# Patient Record
Sex: Male | Born: 1970 | Race: White | Hispanic: No | Marital: Married | Smoking: Former smoker
Health system: Southern US, Community
[De-identification: ages and names within clinical notes are randomized; demographics above are authoritative.]

## PROBLEM LIST (undated history)

## (undated) DIAGNOSIS — I10 Essential (primary) hypertension: Secondary | ICD-10-CM

## (undated) DIAGNOSIS — N200 Calculus of kidney: Secondary | ICD-10-CM

## (undated) HISTORY — PX: GASTRECTOMY: SHX58

---

## 2020-11-24 ENCOUNTER — Ambulatory Visit
Admission: EM | Admit: 2020-11-24 | Discharge: 2020-11-24 | Disposition: A | Discharge status: Home / Self Care | Payer: Managed Care, Other (non HMO)

## 2020-11-24 DIAGNOSIS — R0781 Pleurodynia: Secondary | ICD-10-CM | POA: Diagnosis not present

## 2020-11-24 HISTORY — DX: Essential (primary) hypertension: I10

## 2020-11-24 MED ORDER — PREDNISONE 10 MG (21) PO TBPK: ORAL_TABLET | Freq: Every day | ORAL | 0 refills | Status: DC

## 2020-11-24 MED ORDER — DEXAMETHASONE SODIUM PHOSPHATE 10 MG/ML IJ SOLN
10.0000 mg | Freq: Once | INTRAMUSCULAR | Status: AC
  Administered 2020-11-24: 10 mg via INTRAMUSCULAR

## 2020-11-24 MED ORDER — CYCLOBENZAPRINE HCL 10 MG PO TABS: 10.0000 mg | ORAL_TABLET | Freq: Two times a day (BID) | ORAL | 0 refills | Status: DC | PRN

## 2020-11-24 NOTE — Discharge Instructions (Addendum)
Continue conservative management of rest, ice, and gentle stretches Steroid shot given Prednisone prescribed Take cyclobenzaprine at nighttime for symptomatic relief. Avoid driving or operating heavy machinery while using medication. Follow up with PCP if symptoms persist Return or go to the ER if you have any new or worsening symptoms (fever, chills, chest pain, shortness of breath, chest pain, etc...)

## 2020-11-24 NOTE — ED Triage Notes (Signed)
Pt present left side rib pain, pt states he was at his grandson party at the [employer]. He jump in the balls and was unable to get out and his son in law pulled him out by one arm and now he is having extreme left side pain.

## 2020-11-24 NOTE — ED Provider Notes (Signed)
[facility]   [id number] 11/24/20 Arrival Time: 1324  CC: Rib PAIN  SUBJECTIVE: History from: patient. Ernest Rodgers is a 50 y.o. male complains of LT rib pain and injury that occurred 1 day ago.  Symptoms began after he jumped into ball pit.  Son-in-law picked him up by LT arm.  Localizes the pain to the rib cage.  Describes the pain as intermittent and sharp in character.  Has tried OTC medications without relief.  Symptoms are made worse with deep breath and laughing.  Report similar symptoms in the past.  Denies fever, chills, erythema, ecchymosis, effusion, weakness, numbness and tingling, CP, SOB.    ROS: As per HPI.  All other pertinent ROS negative.     Past Medical History:  Diagnosis Date   Hypertension    Past Surgical History:  Procedure Laterality Date   GASTRECTOMY     No Known Allergies No current facility-administered medications on file prior to encounter.   Current Outpatient Medications on File Prior to Encounter  Medication Sig Dispense Refill   Fluoxetine HCl, PMDD, 20 MG TABS Take by mouth.     lisinopril (ZESTRIL) 10 MG tablet lisinopril 10 mg tablet     furosemide (LASIX) 20 MG tablet Take 20 mg by mouth every morning.     metoprolol succinate (TOPROL-XL) 50 MG 24 hr tablet Take 50 mg by mouth daily.     omeprazole (PRILOSEC) 20 MG capsule Take 20 mg by mouth daily.     Social History   Socioeconomic History   Marital status: Married    Spouse name: Not on file   Number of children: Not on file   Years of education: Not on file   Highest education level: Not on file  Occupational History   Not on file  Tobacco Use   Smoking status: Never   Smokeless tobacco: Never  Substance and Sexual Activity   Alcohol use: Not on file   Drug use: Not on file   Sexual activity: Not on file  Other Topics Concern   Not on file  Social History Narrative   Not on file   Social Determinants of Health   Financial Resource Strain: Not on file  Food  Insecurity: Not on file  Transportation Needs: Not on file  Physical Activity: Not on file  Stress: Not on file  Social Connections: Not on file  Intimate Partner Violence: Not on file   History reviewed. No pertinent family history.  OBJECTIVE:  Vitals:   11/24/20 1443  BP: 117/80  Pulse: 77  Resp: 18  Temp: 97.8 F (36.6 C)  TempSrc: Oral  SpO2: 95%    General appearance: ALERT; in no acute distress.  Head: NCAT Lungs: Normal respiratory effort; CTAB CV: RRR Musculoskeletal: Rib cage Inspection: Skin warm, dry, clear and intact without obvious erythema, effusion, or ecchymosis.  Palpation: TTP over LT LT rib cage; TTP with AP compression of chest ROM: FROM active and passive Skin: warm and dry Neurologic: Ambulates without difficulty; Sensation intact about the upper/ lower extremities Psychological: alert and cooperative; normal mood and affect  ASSESSMENT & PLAN:  1. Rib pain on left side     Meds ordered this encounter  Medications   predniSONE (STERAPRED UNI-PAK 21 TAB) 10 MG (21) TBPK tablet    Sig: Take by mouth daily. Take 6 tabs by mouth daily  for 2 days, then 5 tabs for 2 days, then 4 tabs for 2 days, then 3 tabs for 2 days,  2 tabs for 2 days, then 1 tab by mouth daily for 2 days    Dispense:  42 tablet    Refill:  0    Order Specific Question:   Supervising Provider    Answer:   Eustace Moore [[id number]]   cyclobenzaprine (FLEXERIL) 10 MG tablet    Sig: Take 1 tablet (10 mg total) by mouth 2 (two) times daily as needed for muscle spasms.    Dispense:  20 tablet    Refill:  0    Order Specific Question:   Supervising Provider    Answer:   Eustace Moore [[id number]]   dexamethasone (DECADRON) injection 10 mg    Continue conservative management of rest, ice, and gentle stretches Steroid shot given Prednisone prescribed Take cyclobenzaprine at nighttime for symptomatic relief. Avoid driving or operating heavy machinery while using  medication. Follow up with PCP if symptoms persist Return or go to the ER if you have any new or worsening symptoms (fever, chills, chest pain, shortness of breath, chest pain, etc...)   Reviewed expectations re: course of current medical issues. Questions answered. Outlined signs and symptoms indicating need for more acute intervention. Patient verbalized understanding. After Visit Summary given.     Rennis Harding, PA-C 11/24/20 1520

## 2020-11-26 ENCOUNTER — Inpatient Hospital Stay (HOSPITAL_COMMUNITY)
Admission: EM | Admit: 2020-11-26 | Discharge: 2020-12-13 | DRG: 981 | Disposition: A | Discharge status: Rehabilitation Facility | Payer: Managed Care, Other (non HMO) | Attending: Family Medicine | Admitting: Family Medicine

## 2020-11-26 ENCOUNTER — Emergency Department (HOSPITAL_COMMUNITY): Payer: Managed Care, Other (non HMO)

## 2020-11-26 ENCOUNTER — Other Ambulatory Visit: Payer: Self-pay

## 2020-11-26 ENCOUNTER — Encounter (HOSPITAL_COMMUNITY): Payer: Self-pay | Admitting: *Deleted

## 2020-11-26 DIAGNOSIS — K661 Hemoperitoneum: Secondary | ICD-10-CM | POA: Diagnosis present

## 2020-11-26 DIAGNOSIS — I110 Hypertensive heart disease with heart failure: Secondary | ICD-10-CM | POA: Diagnosis present

## 2020-11-26 DIAGNOSIS — K760 Fatty (change of) liver, not elsewhere classified: Secondary | ICD-10-CM | POA: Diagnosis present

## 2020-11-26 DIAGNOSIS — R0682 Tachypnea, not elsewhere classified: Secondary | ICD-10-CM

## 2020-11-26 DIAGNOSIS — G9341 Metabolic encephalopathy: Secondary | ICD-10-CM | POA: Diagnosis not present

## 2020-11-26 DIAGNOSIS — K852 Alcohol induced acute pancreatitis without necrosis or infection: Secondary | ICD-10-CM | POA: Diagnosis not present

## 2020-11-26 DIAGNOSIS — R935 Abnormal findings on diagnostic imaging of other abdominal regions, including retroperitoneum: Secondary | ICD-10-CM | POA: Diagnosis not present

## 2020-11-26 DIAGNOSIS — N179 Acute kidney failure, unspecified: Secondary | ICD-10-CM | POA: Diagnosis not present

## 2020-11-26 DIAGNOSIS — R400 Somnolence: Secondary | ICD-10-CM

## 2020-11-26 DIAGNOSIS — S36029A Unspecified contusion of spleen, initial encounter: Secondary | ICD-10-CM | POA: Diagnosis present

## 2020-11-26 DIAGNOSIS — I10 Essential (primary) hypertension: Secondary | ICD-10-CM | POA: Diagnosis not present

## 2020-11-26 DIAGNOSIS — R5381 Other malaise: Secondary | ICD-10-CM | POA: Diagnosis not present

## 2020-11-26 DIAGNOSIS — T1490XA Injury, unspecified, initial encounter: Secondary | ICD-10-CM

## 2020-11-26 DIAGNOSIS — Z978 Presence of other specified devices: Secondary | ICD-10-CM

## 2020-11-26 DIAGNOSIS — J9690 Respiratory failure, unspecified, unspecified whether with hypoxia or hypercapnia: Secondary | ICD-10-CM

## 2020-11-26 DIAGNOSIS — R069 Unspecified abnormalities of breathing: Secondary | ICD-10-CM

## 2020-11-26 DIAGNOSIS — E870 Hyperosmolality and hypernatremia: Secondary | ICD-10-CM | POA: Diagnosis not present

## 2020-11-26 DIAGNOSIS — F10939 Alcohol use, unspecified with withdrawal, unspecified: Secondary | ICD-10-CM | POA: Diagnosis present

## 2020-11-26 DIAGNOSIS — F10239 Alcohol dependence with withdrawal, unspecified: Secondary | ICD-10-CM | POA: Diagnosis present

## 2020-11-26 DIAGNOSIS — K851 Biliary acute pancreatitis without necrosis or infection: Secondary | ICD-10-CM | POA: Diagnosis not present

## 2020-11-26 DIAGNOSIS — S2242XA Multiple fractures of ribs, left side, initial encounter for closed fracture: Secondary | ICD-10-CM | POA: Diagnosis present

## 2020-11-26 DIAGNOSIS — Z79899 Other long term (current) drug therapy: Secondary | ICD-10-CM

## 2020-11-26 DIAGNOSIS — K8521 Alcohol induced acute pancreatitis with uninfected necrosis: Principal | ICD-10-CM | POA: Diagnosis present

## 2020-11-26 DIAGNOSIS — S2242XS Multiple fractures of ribs, left side, sequela: Secondary | ICD-10-CM | POA: Diagnosis not present

## 2020-11-26 DIAGNOSIS — D735 Infarction of spleen: Secondary | ICD-10-CM | POA: Diagnosis present

## 2020-11-26 DIAGNOSIS — R1084 Generalized abdominal pain: Secondary | ICD-10-CM | POA: Diagnosis not present

## 2020-11-26 DIAGNOSIS — R319 Hematuria, unspecified: Secondary | ICD-10-CM | POA: Diagnosis not present

## 2020-11-26 DIAGNOSIS — Z20822 Contact with and (suspected) exposure to covid-19: Secondary | ICD-10-CM | POA: Diagnosis present

## 2020-11-26 DIAGNOSIS — D62 Acute posthemorrhagic anemia: Secondary | ICD-10-CM | POA: Diagnosis not present

## 2020-11-26 DIAGNOSIS — E669 Obesity, unspecified: Secondary | ICD-10-CM | POA: Diagnosis present

## 2020-11-26 DIAGNOSIS — K7010 Alcoholic hepatitis without ascites: Secondary | ICD-10-CM | POA: Diagnosis present

## 2020-11-26 DIAGNOSIS — E876 Hypokalemia: Secondary | ICD-10-CM | POA: Diagnosis present

## 2020-11-26 DIAGNOSIS — K5669 Other partial intestinal obstruction: Secondary | ICD-10-CM | POA: Diagnosis present

## 2020-11-26 DIAGNOSIS — Z8249 Family history of ischemic heart disease and other diseases of the circulatory system: Secondary | ICD-10-CM

## 2020-11-26 DIAGNOSIS — J9601 Acute respiratory failure with hypoxia: Secondary | ICD-10-CM | POA: Diagnosis not present

## 2020-11-26 DIAGNOSIS — J189 Pneumonia, unspecified organism: Secondary | ICD-10-CM

## 2020-11-26 DIAGNOSIS — G931 Anoxic brain damage, not elsewhere classified: Secondary | ICD-10-CM | POA: Diagnosis not present

## 2020-11-26 DIAGNOSIS — Z781 Physical restraint status: Secondary | ICD-10-CM

## 2020-11-26 DIAGNOSIS — F10231 Alcohol dependence with withdrawal delirium: Secondary | ICD-10-CM | POA: Diagnosis not present

## 2020-11-26 DIAGNOSIS — F1010 Alcohol abuse, uncomplicated: Secondary | ICD-10-CM | POA: Diagnosis not present

## 2020-11-26 DIAGNOSIS — K567 Ileus, unspecified: Secondary | ICD-10-CM

## 2020-11-26 DIAGNOSIS — J810 Acute pulmonary edema: Secondary | ICD-10-CM | POA: Diagnosis not present

## 2020-11-26 DIAGNOSIS — F10230 Alcohol dependence with withdrawal, uncomplicated: Secondary | ICD-10-CM | POA: Diagnosis not present

## 2020-11-26 DIAGNOSIS — R040 Epistaxis: Secondary | ICD-10-CM | POA: Diagnosis not present

## 2020-11-26 DIAGNOSIS — W19XXXA Unspecified fall, initial encounter: Secondary | ICD-10-CM | POA: Diagnosis present

## 2020-11-26 DIAGNOSIS — I5031 Acute diastolic (congestive) heart failure: Secondary | ICD-10-CM | POA: Diagnosis not present

## 2020-11-26 DIAGNOSIS — J690 Pneumonitis due to inhalation of food and vomit: Secondary | ICD-10-CM | POA: Diagnosis not present

## 2020-11-26 DIAGNOSIS — K859 Acute pancreatitis without necrosis or infection, unspecified: Secondary | ICD-10-CM | POA: Diagnosis present

## 2020-11-26 DIAGNOSIS — M533 Sacrococcygeal disorders, not elsewhere classified: Secondary | ICD-10-CM | POA: Diagnosis present

## 2020-11-26 DIAGNOSIS — Z87891 Personal history of nicotine dependence: Secondary | ICD-10-CM

## 2020-11-26 DIAGNOSIS — R0603 Acute respiratory distress: Secondary | ICD-10-CM | POA: Diagnosis not present

## 2020-11-26 DIAGNOSIS — Z4659 Encounter for fitting and adjustment of other gastrointestinal appliance and device: Secondary | ICD-10-CM

## 2020-11-26 DIAGNOSIS — J9602 Acute respiratory failure with hypercapnia: Secondary | ICD-10-CM | POA: Diagnosis not present

## 2020-11-26 DIAGNOSIS — Z9884 Bariatric surgery status: Secondary | ICD-10-CM

## 2020-11-26 DIAGNOSIS — R1011 Right upper quadrant pain: Secondary | ICD-10-CM | POA: Diagnosis not present

## 2020-11-26 DIAGNOSIS — F329 Major depressive disorder, single episode, unspecified: Secondary | ICD-10-CM | POA: Diagnosis present

## 2020-11-26 DIAGNOSIS — Z23 Encounter for immunization: Secondary | ICD-10-CM

## 2020-11-26 DIAGNOSIS — K56609 Unspecified intestinal obstruction, unspecified as to partial versus complete obstruction: Secondary | ICD-10-CM

## 2020-11-26 DIAGNOSIS — D539 Nutritional anemia, unspecified: Secondary | ICD-10-CM | POA: Diagnosis present

## 2020-11-26 DIAGNOSIS — J9600 Acute respiratory failure, unspecified whether with hypoxia or hypercapnia: Secondary | ICD-10-CM | POA: Diagnosis not present

## 2020-11-26 DIAGNOSIS — Z0189 Encounter for other specified special examinations: Secondary | ICD-10-CM

## 2020-11-26 DIAGNOSIS — K219 Gastro-esophageal reflux disease without esophagitis: Secondary | ICD-10-CM | POA: Diagnosis present

## 2020-11-26 DIAGNOSIS — Z87442 Personal history of urinary calculi: Secondary | ICD-10-CM

## 2020-11-26 DIAGNOSIS — R0600 Dyspnea, unspecified: Secondary | ICD-10-CM

## 2020-11-26 DIAGNOSIS — R739 Hyperglycemia, unspecified: Secondary | ICD-10-CM | POA: Diagnosis present

## 2020-11-26 DIAGNOSIS — R109 Unspecified abdominal pain: Secondary | ICD-10-CM | POA: Diagnosis not present

## 2020-11-26 DIAGNOSIS — F419 Anxiety disorder, unspecified: Secondary | ICD-10-CM | POA: Diagnosis present

## 2020-11-26 DIAGNOSIS — Z6839 Body mass index (BMI) 39.0-39.9, adult: Secondary | ICD-10-CM

## 2020-11-26 DIAGNOSIS — I959 Hypotension, unspecified: Secondary | ICD-10-CM | POA: Diagnosis not present

## 2020-11-26 HISTORY — DX: Calculus of kidney: N20.0

## 2020-11-26 LAB — URINALYSIS, ROUTINE W REFLEX MICROSCOPIC
Bilirubin Urine: NEGATIVE
Glucose, UA: NEGATIVE mg/dL
Hgb urine dipstick: NEGATIVE
Ketones, ur: 5 mg/dL — AB
Leukocytes,Ua: NEGATIVE
Nitrite: NEGATIVE
Protein, ur: NEGATIVE mg/dL
Specific Gravity, Urine: 1.015 (ref 1.005–1.030)
pH: 5 (ref 5.0–8.0)

## 2020-11-26 LAB — COMPREHENSIVE METABOLIC PANEL
ALT: 87 U/L — ABNORMAL HIGH (ref 0–44)
AST: 105 U/L — ABNORMAL HIGH (ref 15–41)
Albumin: 4.3 g/dL (ref 3.5–5.0)
Alkaline Phosphatase: 67 U/L (ref 38–126)
Anion gap: 19 — ABNORMAL HIGH (ref 5–15)
BUN: 11 mg/dL (ref 6–20)
CO2: 20 mmol/L — ABNORMAL LOW (ref 22–32)
Calcium: 9.8 mg/dL (ref 8.9–10.3)
Chloride: 100 mmol/L (ref 98–111)
Creatinine, Ser: 0.79 mg/dL (ref 0.61–1.24)
GFR, Estimated: >60 mL/min (ref >60)
Glucose, Bld: 103 mg/dL — ABNORMAL HIGH (ref 70–99)
Potassium: 3.1 mmol/L — ABNORMAL LOW (ref 3.5–5.1)
Sodium: 139 mmol/L (ref 135–145)
Total Bilirubin: 2 mg/dL — ABNORMAL HIGH (ref 0.3–1.2)
Total Protein: 7.6 g/dL (ref 6.5–8.1)

## 2020-11-26 LAB — CBC WITH DIFFERENTIAL/PLATELET
Abs Immature Granulocytes: 0.03 10*3/uL (ref 0.00–0.07)
Basophils Absolute: 0 10*3/uL (ref 0.0–0.1)
Basophils Relative: 0 %
Eosinophils Absolute: 0 10*3/uL (ref 0.0–0.5)
Eosinophils Relative: 0 %
HCT: 40.8 % (ref 39.0–52.0)
Hemoglobin: 14.6 g/dL (ref 13.0–17.0)
Immature Granulocytes: 0 %
Lymphocytes Relative: 9 %
Lymphs Abs: 0.7 10*3/uL (ref 0.7–4.0)
MCH: 37.4 pg — ABNORMAL HIGH (ref 26.0–34.0)
MCHC: 35.8 g/dL (ref 30.0–36.0)
MCV: 104.6 fL — ABNORMAL HIGH (ref 80.0–100.0)
Monocytes Absolute: 0.8 10*3/uL (ref 0.1–1.0)
Monocytes Relative: 11 %
Neutro Abs: 6 10*3/uL (ref 1.7–7.7)
Neutrophils Relative %: 80 %
Platelets: 160 10*3/uL (ref 150–400)
RBC: 3.9 MIL/uL — ABNORMAL LOW (ref 4.22–5.81)
RDW: 14.7 % (ref 11.5–15.5)
WBC: 7.5 10*3/uL (ref 4.0–10.5)
nRBC: 0.3 % — ABNORMAL HIGH (ref 0.0–0.2)

## 2020-11-26 LAB — RAPID URINE DRUG SCREEN, HOSP PERFORMED
Amphetamines: NOT DETECTED
Barbiturates: NOT DETECTED
Benzodiazepines: NOT DETECTED
Cocaine: NOT DETECTED
Opiates: POSITIVE — AB
Tetrahydrocannabinol: NOT DETECTED

## 2020-11-26 LAB — HIV ANTIBODY (ROUTINE TESTING W REFLEX): HIV Screen 4th Generation wRfx: NONREACTIVE

## 2020-11-26 LAB — ETHANOL: Alcohol, Ethyl (B): <10 mg/dL (ref <10)

## 2020-11-26 LAB — TROPONIN I (HIGH SENSITIVITY)
Troponin I (High Sensitivity): 5 ng/L (ref <18)
Troponin I (High Sensitivity): 8 ng/L (ref <18)

## 2020-11-26 LAB — LACTIC ACID, PLASMA
Lactic Acid, Venous: 2.3 mmol/L (ref 0.5–1.9)
Lactic Acid, Venous: 1.7 mmol/L (ref 0.5–1.9)

## 2020-11-26 LAB — LIPID PANEL
Cholesterol: 188 mg/dL (ref 0–200)
HDL: 97 mg/dL (ref >40)
LDL Cholesterol: 78 mg/dL (ref 0–99)
Total CHOL/HDL Ratio: 1.9 RATIO
Triglycerides: 67 mg/dL (ref <150)
VLDL: 13 mg/dL (ref 0–40)

## 2020-11-26 LAB — SARS CORONAVIRUS 2 (TAT 6-24 HRS): SARS Coronavirus 2: NEGATIVE

## 2020-11-26 LAB — MAGNESIUM: Magnesium: 1.1 mg/dL — ABNORMAL LOW (ref 1.7–2.4)

## 2020-11-26 LAB — POC SARS CORONAVIRUS 2 AG -  ED: SARSCOV2ONAVIRUS 2 AG: NEGATIVE

## 2020-11-26 MED ORDER — LISINOPRIL 10 MG PO TABS
10.0000 mg | ORAL_TABLET | Freq: Every day | ORAL | Status: DC
  Administered 2020-11-27: 10 mg via ORAL
  Filled 2020-11-26 (×2): qty 1

## 2020-11-26 MED ORDER — SODIUM CHLORIDE 0.9 % IV BOLUS
1000.0000 mL | Freq: Once | INTRAVENOUS | Status: AC
  Administered 2020-11-26: 1000 mL via INTRAVENOUS

## 2020-11-26 MED ORDER — FLUOXETINE HCL 20 MG PO CAPS
20.0000 mg | ORAL_CAPSULE | Freq: Every day | ORAL | Status: DC
  Administered 2020-11-27 – 2020-11-28 (×2): 20 mg via ORAL
  Filled 2020-11-26 (×3): qty 1

## 2020-11-26 MED ORDER — HYDROMORPHONE HCL 1 MG/ML IJ SOLN
2.0000 mg | Freq: Once | INTRAMUSCULAR | Status: AC
  Administered 2020-11-26: 2 mg via INTRAVENOUS
  Filled 2020-11-26: qty 2

## 2020-11-26 MED ORDER — LACTATED RINGERS IV SOLN: INTRAVENOUS | Status: AC

## 2020-11-26 MED ORDER — FENTANYL CITRATE PF 50 MCG/ML IJ SOSY: 25.0000 ug | PREFILLED_SYRINGE | INTRAMUSCULAR | Status: DC | PRN

## 2020-11-26 MED ORDER — LIDOCAINE 5 % EX PTCH
1.0000 | MEDICATED_PATCH | Freq: Every day | CUTANEOUS | Status: DC | PRN
  Administered 2020-12-11: 1 via TRANSDERMAL
  Filled 2020-11-26 (×2): qty 1

## 2020-11-26 MED ORDER — HYDROMORPHONE HCL 1 MG/ML IJ SOLN
2.0000 mg | INTRAMUSCULAR | Status: DC | PRN
  Administered 2020-11-27 (×3): 2 mg via INTRAVENOUS
  Filled 2020-11-26 (×3): qty 2

## 2020-11-26 MED ORDER — POTASSIUM CHLORIDE 10 MEQ/100ML IV SOLN
10.0000 meq | INTRAVENOUS | Status: AC
Start: 2020-11-26 — End: 2020-11-27
  Administered 2020-11-26 (×4): 10 meq via INTRAVENOUS
  Filled 2020-11-26 (×4): qty 100

## 2020-11-26 MED ORDER — FENTANYL CITRATE PF 50 MCG/ML IJ SOSY
50.0000 ug | PREFILLED_SYRINGE | INTRAMUSCULAR | Status: DC | PRN
  Administered 2020-11-26: 50 ug via NASAL

## 2020-11-26 MED ORDER — HYDROMORPHONE HCL 1 MG/ML IJ SOLN
1.0000 mg | Freq: Once | INTRAMUSCULAR | Status: AC
  Administered 2020-11-26: 1 mg via INTRAVENOUS
  Filled 2020-11-26: qty 1

## 2020-11-26 MED ORDER — METOPROLOL SUCCINATE ER 50 MG PO TB24
50.0000 mg | ORAL_TABLET | Freq: Every day | ORAL | Status: DC
  Administered 2020-11-27 – 2020-11-28 (×2): 50 mg via ORAL
  Filled 2020-11-26: qty 2
  Filled 2020-11-26: qty 1

## 2020-11-26 MED ORDER — LORAZEPAM 1 MG PO TABS
1.0000 mg | ORAL_TABLET | Freq: Every day | ORAL | Status: DC
  Administered 2020-11-26: 1 mg via ORAL
  Filled 2020-11-26: qty 1

## 2020-11-26 MED ORDER — ONDANSETRON 4 MG PO TBDP
4.0000 mg | ORAL_TABLET | Freq: Once | ORAL | Status: AC
Start: 2020-11-26 — End: 2020-11-26
  Administered 2020-11-26: 4 mg via ORAL
  Filled 2020-11-26: qty 1

## 2020-11-26 MED ORDER — ONDANSETRON HCL 4 MG/2ML IJ SOLN
4.0000 mg | Freq: Three times a day (TID) | INTRAMUSCULAR | Status: DC | PRN
  Administered 2020-11-27: 4 mg via INTRAVENOUS
  Filled 2020-11-26: qty 2

## 2020-11-26 MED ORDER — PANTOPRAZOLE SODIUM 40 MG PO TBEC
40.0000 mg | DELAYED_RELEASE_TABLET | Freq: Every day | ORAL | Status: DC
  Administered 2020-11-27 – 2020-11-28 (×2): 40 mg via ORAL
  Filled 2020-11-26 (×2): qty 1

## 2020-11-26 MED ORDER — ENOXAPARIN SODIUM 40 MG/0.4ML IJ SOSY
40.0000 mg | PREFILLED_SYRINGE | Freq: Every day | INTRAMUSCULAR | Status: DC
Start: 2020-11-26 — End: 2020-11-29
  Administered 2020-11-26 – 2020-11-28 (×3): 40 mg via SUBCUTANEOUS
  Filled 2020-11-26 (×3): qty 0.4

## 2020-11-26 MED ORDER — POLYETHYLENE GLYCOL 3350 17 G PO PACK
17.0000 g | PACK | Freq: Every day | ORAL | Status: DC
  Administered 2020-11-27: 17 g via ORAL
  Filled 2020-11-26 (×2): qty 1

## 2020-11-26 MED ORDER — IOHEXOL 350 MG/ML SOLN
100.0000 mL | Freq: Once | INTRAVENOUS | Status: AC | PRN
  Administered 2020-11-26: 100 mL via INTRAVENOUS

## 2020-11-26 NOTE — ED Provider Notes (Signed)
Emergency Medicine Provider Triage Evaluation Note  Ernest Rodgers , a 50 y.o. male  was evaluated in triage.  Pt complains of gradual onset, constant, sharp, epigastric pain radiating into his back that began earlier this morning with associated nausea and dry heaving. Pt reports recent left rib injury on 08/13 after being pulled out of a ball pit by his son. He went to UC, did not have any xrays done. Provided prednisone for same. States he has been taking Tylenol with some relief. This AM began having epigastric pain prompting ED visit. Hx of gastric bypass 2017 and cholecystectomy.  Review of Systems  Positive: + epigastric pain, nausea, vomiting Negative: - SOB  Physical Exam  BP 106/74 (BP Location: Left Arm)   Pulse 63   Temp 98.6 F (37 C) (Oral)   Resp (!) 24   SpO2 99%  Gen:   Awake, no distress   Resp:  Normal effort  MSK:   Moves extremities without difficulty  Other:  + epigastric, LUQ, and RUQ TTP  Medical Decision Making  Medically screening exam initiated at 9:01 AM.  Appropriate orders placed.  Kase Shughart was informed that the remainder of the evaluation will be completed by another provider, this initial triage assessment does not replace that evaluation, and the importance of remaining in the ED until their evaluation is complete.     Tanda Rockers, PA-C 11/26/20 8366    Benjiman Core, MD 11/26/20 1517

## 2020-11-26 NOTE — ED Notes (Signed)
Notified RN of pt bp

## 2020-11-26 NOTE — H&P (Addendum)
Family Medicine Teaching [facility] Admission History and Physical Service Pager: [phone]  Patient name: Ernest Rodgers Medical record number: [id number] Date of birth: 06/08/1970 Age: 50 y.o. Gender: male  Primary Care Provider: Pcp, No Consultants: None Code Status: Full, which was confirmed with patient Preferred Emergency Contact: Lois Huxley [phone]  Chief Complaint: Epigastric pain radiating to the back  Assessment and Plan: Ernest Rodgers is a 50 y.o. male presenting with epigastric pain radiating to the back . 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  Acute pancreatitis likely 2/2 to ETOH use  Pain started this morning when he woke up epigastric with radiation to the back, rated 8.5/10 in severity. Was seen two days ago at Urgent Care on 8/13 for "rib pain" after falling into a ball pit. No imaging at that time. He was prescribed prednisone taper, cyclobenzprine 10mg  for muscle spasms, and given IM Decadron 10mg  x1. Drinks 10-14 alcoholic beverages per week. No history of pancreatitis.  In the ED, he was afebrile (98.6 F), mildly tachypneic to 24 resp/min, with HR in the 90's. Labs remarkable for WBC 7.5, Hgb 14.6, elevated lipase 1290, troponin 5 and 8, elevated AST 105/ ALT 87. CT chest/abdomen/pelvis with contrast impression revealed acute pancreatitis, likely associated reactive changes of duodenum, hepatic steatosis, and acute nondisplaced left 7th rib fracture, status post cholecystectomy and gastric surgical changes. Has a history of gastric bypass He received dialudid 1 mg  x2, zofran 4mg  x1, and NS bolus x1. On my exam, he is very uncomfortable appearing and intermittently writhes in pain.   This is likely secondary to ETOH use given his social history and elevated AST/ALT today. Doubt trauma is related as his fractures are non-displaced.  . Not due to gallstones as he is s/p  cholecystectomy. Other etiologies to consider include hypertriglyceridemia for which we will order a lipid panel, infection (unlikely given WBC within normal limits and afebrile), obesity, pancreatic cancer, hypercalcemia, medications. Ranson's criteria score of 0 on admission. - Admit to med surg, attending Dr. - NPO - Aggressive fluid hydration with LR @ 250 ml/hr - Dilaudid 2mg  q2h, can increase if needed - Zofran 4mg  q4h PRN for nausea, vomiting - miralax 17g daily to prevent opioid induced constipation - vital signs per floor - serum triglyceride - serum lactate - serum ethanol level - UDS - CBC, CMP in am  Elevated LFTs  Hepatic Steatosis AST 105/ ALT 87 on admission. Had abdomen on 10/03/2020 for elevated LFTs with impression revealing increased echogenicity of the liver, suggestive of hepatic steatosis.  - monitor LFTs  Rib fractures Stable. Patient tender to palpation over left chest. Left ribs and chest 3+ view X ray impression revealed acute nondisplaced fractures of left anterior sixth through eighth ribs though CT scan only notable for 7th rib fracture. Does have pain with deep breathing and movement. - Pain control with regimen as seen above -Lidocaine patch PRN  Alcohol Use Disorder Patient drinks 2 cocktails per night, and 6-7 drinks on his days off from work. Last drink was last night. Has had tremors in the past when quitting drinking. No history of DT or seizures. - CIWA protocol without Ativan -TOC consult for substance abuse resources  Hypokalemia K+ 3.1 on admission. Repleted with 40 meq Kcl - Monitor with CMP -Mg  -Phos  Hypertension BP elevated upon admission, 166/119 most recently.  Home meds: Lisinopril 10mg  once daily, toprol-xl 50 mg. -  Continue home meds -Vitals per floor  GERD Chronic, stable. Home meds: Omeprazole 20mg  once daily. - Continue home meds  Anxiety and Depression Home meds: Fluoxetine 20mg  - Continue home meds  Lower  extremity edema Being treated for edema by PCP. Takes Lasix "as needed" for swelling. No edema today. - Hold Lasix  FEN/GI: NPO Prophylaxis: Lovenox  Disposition: Stable  History of Present Illness:  Ernest Rodgers is a 50 y.o. male presenting with epigastric abdominal pain and back pain since today, rated at an 8.5/10 currently, 10/10 when he first arrived.  Fell into a ball pit and injured his ribs on Friday. Awoke this morning and started feeling it in back and epigastric region. Went to work and that is when it started. 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 He says he is in pain from the rib fractures, pancreatitis, and a kidney stone. 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  The pain medication he has received is not working. Every 5-6 minutes he had dry heaving with white foam. Diaphoretic all day. No bowel movement today, last was yesterday. No upper respiratory symptoms. Seen at [facility] and prescribed Prednisone and Flexeril  At end of 2017 had gastric bypass surgery. Cholecystectomy "a while ago".   Drinks alcohol 7-days a week, about 2 cocktails each time. Last drink was last night. On days when he is off he will drink more, about 6-7 drinks. He says he "doesn't need to be CIWA'ed" but understands this is protocol. He says he will most likely "completely stop drinking alcohol" because the pain is so severe. No other drugs. Previous smoker, quit 12 years ago. Used to smoke 1ppd for "a long time".   Last meal yesterday. No blood in vomit.   Review Of Systems: Per HPI with the following additions:   Review of Systems  Constitutional:  Positive for diaphoresis. Negative for fever and unexpected weight change.  HENT:  Negative for congestion and rhinorrhea.   Respiratory:  Positive for shortness of breath.   Cardiovascular:  Negative for chest pain.  Gastrointestinal:  Positive for abdominal  pain, nausea and vomiting. Negative for constipation and diarrhea.  Genitourinary:  Positive for flank pain. Negative for dysuria and hematuria.  Musculoskeletal:  Negative for back pain.  Neurological:  Negative for light-headedness and headaches.    There are no problems to display for this patient.   Past Medical History: Past Medical History:  Diagnosis Date   Hypertension    Kidney stone     Past Surgical History: Past Surgical History:  Procedure Laterality Date   GASTRECTOMY      Social History: Social History   Tobacco Use   Smoking status: Never   Smokeless tobacco: Never   Additional social history: Drinks 2 cocktails on nights when he works, and 6-7 cocktails on days off from work. Patient works as a [employer] here at 2018.  Please also refer to relevant sections of EMR.  Family History: History reviewed. No pertinent family history.   Allergies and Medications: No Known Allergies No current facility-administered medications on file prior to encounter.   Current Outpatient Medications on File Prior to Encounter  Medication Sig Dispense Refill   cyclobenzaprine (FLEXERIL) 10 MG tablet Take 1 tablet (10 mg total) by mouth 2 (two) times daily as needed for muscle spasms. 20 tablet 0   Fluoxetine HCl, PMDD,  20 MG TABS Take by mouth.     furosemide (LASIX) 20 MG tablet Take 20 mg by mouth every morning.     lisinopril (ZESTRIL) 10 MG tablet lisinopril 10 mg tablet     metoprolol succinate (TOPROL-XL) 50 MG 24 hr tablet Take 50 mg by mouth daily.     omeprazole (PRILOSEC) 20 MG capsule Take 20 mg by mouth daily.     predniSONE (STERAPRED UNI-PAK 21 TAB) 10 MG (21) TBPK tablet Take by mouth daily. Take 6 tabs by mouth daily  for 2 days, then 5 tabs for 2 days, then 4 tabs for 2 days, then 3 tabs for 2 days, 2 tabs for 2 days, then 1 tab by mouth daily for 2 days 42 tablet 0    Objective: BP (!) 173/99   Pulse 85   Temp (!) 97.3 F (36.3 C)   Resp 18   Ht  5\' 10"  (1.778 m)   Wt 112 kg   SpO2 96%   BMI 35.44 kg/m  Exam: General: Uncomfortable but non-toxic appearing male who appears his stated age, intermittently writhes in pain Eyes: Pupils PERRL, EOMI ENTM: MMM Neck: Supple, normal ROM Cardiovascular: Regular rhythm, tachycardic to 110s bpm, no murmurs appreciated Respiratory: CTAB, good aeration throughout, Increased respiratory rate 24 resp/min. No  Gastrointestinal: Obese, soft, exquisitely tender to palpation in epigastric region/ right flank/left flank. No bruising. Normoactive bowel sounds.  MSK: Moves all extremities equally and normally Derm: No visible rashes or lesions. Tattoo on right forearm Neuro: Awake, alert, answers questions appropriately. No focal neurologic defects Psych: Normal affect.  Labs and Imaging: CBC BMET  Recent Labs  Lab 11/26/20 0907  WBC 7.5  HGB 14.6  HCT 40.8  PLT 160   Recent Labs  Lab 11/26/20 0907  NA 139  K 3.1*  CL 100  CO2 20*  BUN 11  CREATININE 0.79  GLUCOSE 103*  CALCIUM 9.8     EKG: Normal sinus rhythm, 61 bpm, no acute ST or T wave changes noted, no QT prolongation   11/28/20, DO 11/26/2020, 4:43 PM PGY-1, [facility] FPTS Intern pager: [phone], text pages welcome  FPTS Upper-Level Resident Addendum   I have independently interviewed and examined the patient. I have discussed the above with the original author and agree with their documentation. My edits for correction/addition/clarification are included where appropriate. Please see also any attending notes.   [phone], DO PGY-2, [facility] 11/26/2020 7:11 PM  FPTS Service pager: [phone] (text pages welcome through [facility])

## 2020-11-26 NOTE — ED Triage Notes (Signed)
Pt injured left rib on 8/13. Onset this am of mid epigastric pain that radiates through to his back with n/v. Denies diarrhea, denies urinary symptoms.

## 2020-11-26 NOTE — ED Provider Notes (Signed)
Patient is a 50 year old male whose care was transferred to me at shift change by Lonell Grandchild.  Her HPI is below:  Ernest Rodgers is a 50 y.o. male with Pmhx HTN and kidney stone who presents to the ED today with complaint of  gradual onset, constant, sharp, epigastric pain radiating into his back that began earlier this morning with associated nausea and dry heaving. Pt reports recent left rib injury on 08/13 after being pulled out of a ball pit by his son. He states that while being pulled up he fell and landed onto the edge of the ball pit causing a pop sensation in his left rib/immediate pain. He went to UC, did not have any xrays done. Provided prednisone and muscle relaxer for same. States he has been taking Tylenol with some relief. This AM began having epigastric pain prompting ED visit. Hx of gastric bypass 2017 and cholecystectomy. No other complaints at this time.  Physical Exam  BP (!) 160/113   Pulse 93   Temp (!) 97.3 F (36.3 C)   Resp 20   SpO2 97%   Physical Exam Vitals and nursing note reviewed.  Constitutional:      Appearance: He is obese. He is not ill-appearing or diaphoretic.  HENT:     Head: Normocephalic and atraumatic.  Eyes:     Conjunctiva/sclera: Conjunctivae normal.  Cardiovascular:     Rate and Rhythm: Normal rate and regular rhythm.  Pulmonary:     Effort: Pulmonary effort is normal.     Breath sounds: Normal breath sounds. No wheezing, rhonchi or rales.     Comments: + left lateral rib TTP. No crepitus.  Chest:     Chest wall: Tenderness present.  Abdominal:     Palpations: Abdomen is soft.     Tenderness: There is abdominal tenderness in the right upper quadrant, epigastric area and left upper quadrant. There is no guarding or rebound.  Musculoskeletal:     Cervical back: Neck supple.  Skin:    General: Skin is warm and dry.  Neurological:     Mental Status: He is alert.  ED Course/Procedures     Procedures  MDM  Patient is a 50 year old male  whose care was transferred to me at shift change from previous PA-C.  Please see her note below for additional information.  In summary, patient has a history of cholecystectomy and came to the emergency department today due to epigastric abdominal pain that began earlier this morning.  Reports associated nausea and dry heaving.  Patient also notes that about 2 days ago he had an accident and broke multiple ribs on the left side.  Patient was found to have a significantly elevated lipase at 1290.  Troponin stable at 5.  Given his recent fractures previous provider order CT scan of the chest as well as abdomen/pelvis.  At shift change patient is pending CT imaging.  He was started on pain medications as well as IV fluids.  CT scan of the chest, abdomen, and pelvis has resulted with findings as noted below:   IMPRESSION:  1. Acute pancreatitis.  2. Likely associated reactive changes of duodenum.  3. Hepatic steatosis.  4. Acute nondisplaced left seventh rib fracture.  5. Otherwise: No acute traumatic injury to the chest, abdomen, or  pelvis. No acute fracture or traumatic malalignment of the thoracic  or lumbar spine.  6. Other imaging findings of potential clinical significance: Status  post cholecystectomy and gastric surgical changes. Scattered colonic  diverticulosis  with no acute diverticulitis. Aortic Atherosclerosis  (ICD10-I70.0).   Unsure the source of patient's pancreatitis.  He drinks about 10-14 alcoholic beverages per week.  History of cholecystectomy.  Given his recent rib fractures, traumatic pancreatitis?    Patient given additional Dilaudid.  Patient will require admission for further management.  We will discuss with the medicine team.         Placido Sou, PA-C 11/26/20 1634    Lorre Nick, MD 11/26/20 2248

## 2020-11-26 NOTE — ED Provider Notes (Signed)
[facility] EMERGENCY DEPARTMENT Provider Note   CSN: [id number] Arrival date & time: 11/26/20  0813     History Chief Complaint  Patient presents with   Abdominal Pain    Ernest Rodgers is a 50 y.o. male with Pmhx HTN and kidney stone who presents to the ED today with complaint of  gradual onset, constant, sharp, epigastric pain radiating into his back that began earlier this morning with associated nausea and dry heaving. Pt reports recent left rib injury on 08/13 after being pulled out of a ball pit by his son. He states that while being pulled up he fell and landed onto the edge of the ball pit causing a pop sensation in his left rib/immediate pain. He went to UC, did not have any xrays done. Provided prednisone and muscle relaxer for same. States he has been taking Tylenol with some relief. This AM began having epigastric pain prompting ED visit. Hx of gastric bypass 2017 and cholecystectomy. No other complaints at this time.   The history is provided by the patient and medical records.      Past Medical History:  Diagnosis Date   Hypertension    Kidney stone     There are no problems to display for this patient.   Past Surgical History:  Procedure Laterality Date   GASTRECTOMY         History reviewed. No pertinent family history.  Social History   Tobacco Use   Smoking status: Never   Smokeless tobacco: Never    Home Medications Prior to Admission medications   Medication Sig Start Date End Date Taking? Authorizing Provider  cyclobenzaprine (FLEXERIL) 10 MG tablet Take 1 tablet (10 mg total) by mouth 2 (two) times daily as needed for muscle spasms. 11/24/20   Wurst, [place], PA-C  Fluoxetine HCl, PMDD, 20 MG TABS Take by mouth. 10/18/20 01/16/21  [provider]  furosemide (LASIX) 20 MG tablet Take 20 mg by mouth every morning. 10/01/20   [provider]  lisinopril (ZESTRIL) 10 MG tablet lisinopril 10 mg tablet 03/30/20    [provider]  metoprolol succinate (TOPROL-XL) 50 MG 24 hr tablet Take 50 mg by mouth daily. 08/25/20   [provider]  omeprazole (PRILOSEC) 20 MG capsule Take 20 mg by mouth daily. 11/20/20   [provider]  predniSONE (STERAPRED UNI-PAK 21 TAB) 10 MG (21) TBPK tablet Take by mouth daily. Take 6 tabs by mouth daily  for 2 days, then 5 tabs for 2 days, then 4 tabs for 2 days, then 3 tabs for 2 days, 2 tabs for 2 days, then 1 tab by mouth daily for 2 days 11/24/20   Alvino Chapel [place], PA-C    Allergies    Patient has no known allergies.  Review of Systems   Review of Systems  Constitutional:  Negative for chills and fever.  Respiratory:  Negative for cough and shortness of breath.   Cardiovascular:  Positive for chest pain (left rib pain).  Gastrointestinal:  Positive for abdominal pain, nausea and vomiting. Negative for constipation and diarrhea.  All other systems reviewed and are negative.  Physical Exam Updated Vital Signs BP (!) 166/109 (BP Location: Right Arm)   Pulse 93   Temp 97.6 F (36.4 C) (Oral)   Resp (!) 22   SpO2 98%   Physical Exam Vitals and nursing note reviewed.  Constitutional:      Appearance: He is obese. He is not ill-appearing or diaphoretic.  HENT:  Head: Normocephalic and atraumatic.  Eyes:     Conjunctiva/sclera: Conjunctivae normal.  Cardiovascular:     Rate and Rhythm: Normal rate and regular rhythm.  Pulmonary:     Effort: Pulmonary effort is normal.     Breath sounds: Normal breath sounds. No wheezing, rhonchi or rales.     Comments: + left lateral rib TTP. No crepitus.  Chest:     Chest wall: Tenderness present.  Abdominal:     Palpations: Abdomen is soft.     Tenderness: There is abdominal tenderness in the right upper quadrant, epigastric area and left upper quadrant. There is no guarding or rebound.  Musculoskeletal:     Cervical back: Neck supple.  Skin:    General: Skin is warm and dry.  Neurological:      Mental Status: He is alert.    ED Results / Procedures / Treatments   Labs (all labs ordered are listed, but only abnormal results are displayed) Labs Reviewed  LIPASE, BLOOD - Abnormal; Notable for the following components:      Result Value   Lipase 1,290 (*)    All other components within normal limits  COMPREHENSIVE METABOLIC PANEL - Abnormal; Notable for the following components:   Potassium 3.1 (*)    CO2 20 (*)    Glucose, Bld 103 (*)    AST 105 (*)    ALT 87 (*)    Total Bilirubin 2.0 (*)    Anion gap 19 (*)    All other components within normal limits  CBC WITH DIFFERENTIAL/PLATELET - Abnormal; Notable for the following components:   RBC 3.90 (*)    MCV 104.6 (*)    MCH 37.4 (*)    nRBC 0.3 (*)    All other components within normal limits  URINALYSIS, ROUTINE W REFLEX MICROSCOPIC - Abnormal; Notable for the following components:   APPearance HAZY (*)    Ketones, ur 5 (*)    All other components within normal limits  TROPONIN I (HIGH SENSITIVITY)  TROPONIN I (HIGH SENSITIVITY)    EKG EKG Interpretation  Date/Time:  Monday November 26 2020 08:32:23 EDT Ventricular Rate:  61 PR Interval:  164 QRS Duration: 94 QT Interval:  436 QTC Calculation: 438 R Axis:   10 Text Interpretation: Normal sinus rhythm Normal ECG Confirmed by Benjiman Core [phone]) on 11/26/2020 1:40:10 PM  Radiology DG Ribs Unilateral W/Chest Left  Result Date: 11/26/2020 CLINICAL DATA:  Left-sided rib pain for the past 2 days. EXAM: LEFT RIBS AND CHEST - 3+ VIEW COMPARISON:  None. FINDINGS: Acute nondisplaced fractures of the left anterior sixth through eighth ribs. The heart size and mediastinal contours are within normal limits. No focal consolidation, pleural effusion, or pneumothorax. IMPRESSION: 1. Acute nondisplaced fractures of the left anterior sixth through eighth ribs. Electronically Signed   By: Obie Dredge M.D.   On: 11/26/2020 10:07    Procedures Procedures    Medications Ordered in ED Medications  fentaNYL (SUBLIMAZE) injection 50 mcg (50 mcg Nasal Given 11/26/20 1221)  sodium chloride 0.9 % bolus 1,000 mL (has no administration in time range)  HYDROmorphone (DILAUDID) injection 1 mg (has no administration in time range)  ondansetron (ZOFRAN-ODT) disintegrating tablet 4 mg (4 mg Oral Given 11/26/20 5102)    ED Course  I have reviewed the triage vital signs and the nursing notes.  Pertinent labs & imaging results that were available during my care of the patient were reviewed by me and considered in my medical decision making (  see chart for details).    MDM Rules/Calculators/A&P                           50 year old male who presents to the ED today complaining of epigastric abdominal pain that began earlier this morning with associated nausea and dry heaving.  He mentioned on triage that he is currently being treated for broken ribs on the left side after ball pit injury 2 days ago however did not appear to have x-rays done in urgent care.  He was medically screened by myself in the triage area and work-up started including chest x-ray, EKG, labs CBC, CMP, lipase, troponin.  Lipase has returned significantly elevated 1290 and patient was brought back to her room.  Troponin stable at 5.  X-ray does show 3 broken ribs on the left side.  Given new onset pancreatitis and broken ribs on the left side we will plan for CT chest as well as CT abdomen and pelvis for further evaluation. Question if rb fractures could be contributing to pancreatitis? Pt does drink 1 mixed drink per night. Fluids, antiemetics, pain medication provided. Attending physician Dr. Rubin Payor evaluated patient and agrees with plan.   At shift change case signed out to [facility], PA-C, who will admit patient pending CT scans.   Final Clinical Impression(s) / ED Diagnoses Final diagnoses:  None    Rx / DC Orders ED Discharge Orders     None        Tanda Rockers,  PA-C 11/26/20 1508    Benjiman Core, MD 11/26/20 1517

## 2020-11-27 DIAGNOSIS — S2242XA Multiple fractures of ribs, left side, initial encounter for closed fracture: Secondary | ICD-10-CM | POA: Insufficient documentation

## 2020-11-27 DIAGNOSIS — F1010 Alcohol abuse, uncomplicated: Secondary | ICD-10-CM | POA: Diagnosis not present

## 2020-11-27 DIAGNOSIS — K859 Acute pancreatitis without necrosis or infection, unspecified: Secondary | ICD-10-CM | POA: Diagnosis not present

## 2020-11-27 DIAGNOSIS — S2242XS Multiple fractures of ribs, left side, sequela: Secondary | ICD-10-CM

## 2020-11-27 LAB — CBC
HCT: 45.7 % (ref 39.0–52.0)
Hemoglobin: 15.7 g/dL (ref 13.0–17.0)
MCH: 37.7 pg — ABNORMAL HIGH (ref 26.0–34.0)
MCHC: 34.4 g/dL (ref 30.0–36.0)
MCV: 109.9 fL — ABNORMAL HIGH (ref 80.0–100.0)
Platelets: 128 10*3/uL — ABNORMAL LOW (ref 150–400)
RBC: 4.16 MIL/uL — ABNORMAL LOW (ref 4.22–5.81)
RDW: 15 % (ref 11.5–15.5)
WBC: 5 10*3/uL (ref 4.0–10.5)
nRBC: 0 % (ref 0.0–0.2)

## 2020-11-27 LAB — LIPASE, BLOOD: Lipase: 507 U/L — ABNORMAL HIGH (ref 11–51)

## 2020-11-27 LAB — COMPREHENSIVE METABOLIC PANEL
ALT: 208 U/L — ABNORMAL HIGH (ref 0–44)
AST: 873 U/L — ABNORMAL HIGH (ref 15–41)
Albumin: 3.6 g/dL (ref 3.5–5.0)
Alkaline Phosphatase: 81 U/L (ref 38–126)
Anion gap: 15 (ref 5–15)
BUN: 17 mg/dL (ref 6–20)
CO2: 22 mmol/L (ref 22–32)
Calcium: 9 mg/dL (ref 8.9–10.3)
Chloride: 104 mmol/L (ref 98–111)
Creatinine, Ser: 0.93 mg/dL (ref 0.61–1.24)
GFR, Estimated: >60 mL/min (ref >60)
Glucose, Bld: 172 mg/dL — ABNORMAL HIGH (ref 70–99)
Potassium: 4.1 mmol/L (ref 3.5–5.1)
Sodium: 141 mmol/L (ref 135–145)
Total Bilirubin: 6.5 mg/dL — ABNORMAL HIGH (ref 0.3–1.2)
Total Protein: 6.4 g/dL — ABNORMAL LOW (ref 6.5–8.1)

## 2020-11-27 LAB — HEPATITIS PANEL, ACUTE
HCV Ab: NONREACTIVE
Hep A IgM: NONREACTIVE
Hep B C IgM: NONREACTIVE
Hepatitis B Surface Ag: NONREACTIVE

## 2020-11-27 LAB — ACETAMINOPHEN LEVEL: Acetaminophen (Tylenol), Serum: <10 ug/mL — ABNORMAL LOW (ref 10–30)

## 2020-11-27 MED ORDER — SODIUM CHLORIDE 0.9 % IV SOLN: INTRAVENOUS | Status: DC

## 2020-11-27 MED ORDER — LORAZEPAM 1 MG PO TABS
1.0000 mg | ORAL_TABLET | Freq: Three times a day (TID) | ORAL | Status: DC
  Administered 2020-11-27 – 2020-11-28 (×4): 1 mg via ORAL
  Filled 2020-11-27 (×4): qty 1

## 2020-11-27 MED ORDER — LORAZEPAM 1 MG PO TABS
1.0000 mg | ORAL_TABLET | ORAL | Status: DC | PRN
  Administered 2020-11-27: 1 mg via ORAL
  Filled 2020-11-27: qty 1

## 2020-11-27 MED ORDER — ADULT MULTIVITAMIN W/MINERALS CH
1.0000 | ORAL_TABLET | Freq: Every day | ORAL | Status: DC
  Administered 2020-11-27 – 2020-11-28 (×2): 1 via ORAL
  Filled 2020-11-27 (×2): qty 1

## 2020-11-27 MED ORDER — HYDROMORPHONE HCL 1 MG/ML IJ SOLN
1.0000 mg | INTRAMUSCULAR | Status: DC | PRN
Start: 2020-11-27 — End: 2020-11-28
  Administered 2020-11-27 – 2020-11-28 (×4): 1 mg via INTRAVENOUS
  Filled 2020-11-27 (×5): qty 1

## 2020-11-27 MED ORDER — LORAZEPAM 2 MG/ML IJ SOLN
1.0000 mg | INTRAMUSCULAR | Status: DC | PRN
  Administered 2020-11-28 – 2020-11-29 (×8): 2 mg via INTRAVENOUS
  Filled 2020-11-27 (×8): qty 1

## 2020-11-27 MED ORDER — THIAMINE HCL 100 MG/ML IJ SOLN
100.0000 mg | Freq: Every day | INTRAMUSCULAR | Status: DC
  Filled 2020-11-27: qty 2

## 2020-11-27 MED ORDER — MAGNESIUM SULFATE 4 GM/100ML IV SOLN
4.0000 g | Freq: Once | INTRAVENOUS | Status: AC
  Administered 2020-11-27: 4 g via INTRAVENOUS
  Filled 2020-11-27: qty 100

## 2020-11-27 MED ORDER — FOLIC ACID 1 MG PO TABS
1.0000 mg | ORAL_TABLET | Freq: Every day | ORAL | Status: DC
  Administered 2020-11-27 – 2020-11-28 (×2): 1 mg via ORAL
  Filled 2020-11-27 (×2): qty 1

## 2020-11-27 MED ORDER — THIAMINE HCL 100 MG PO TABS
100.0000 mg | ORAL_TABLET | Freq: Every day | ORAL | Status: DC
  Administered 2020-11-27 – 2020-11-28 (×2): 100 mg via ORAL
  Filled 2020-11-27 (×2): qty 1

## 2020-11-27 NOTE — Progress Notes (Signed)
FPTS Interim Night Progress Note  S:Patient sleeping comfortably.  Rounded with primary night RN.  CIWA scores 8<5.  Continues to have tachycardia.  Pain controlled with current medication.  No other concerns voiced.     O: Today's Vitals   11/27/20 0302 11/27/20 0400 11/27/20 0600 11/27/20 0635  BP:  136/89 100/82   Pulse:  (!) 110 (!) 130   Resp:  12 13   Temp:      TempSrc:      SpO2:  96% 91%   Weight:      Height:      PainSc: Asleep   5       A/P: CIWA with Ativan Scheduled Ativan 1 mg TID x 3/7 Mag 4 gm IV Follow electrolytes and replete  Dana Allan MD PGY-3, [facility] Family Medicine Service pager [phone]

## 2020-11-27 NOTE — Progress Notes (Addendum)
Family Medicine Teaching Service Daily Progress Note Intern Pager: [phone]  Patient name: Ernest Rodgers Medical record number: [id number] Date of birth: [birth year] Age: 50 y.o. Gender: male  Primary Care Provider: Pcp, No Consultants: None Code Status: Full  Pt Overview and Major Events to Date:  Admitted 11/26/2020  Hospital day 3  Assessment and Plan: Ernest Rodgers is a 50 y.o. male presenting with epigastric pain radiating to the back . 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   Acute pancreatitis likely 2/2 to ETOH use Stable. Pain 7/10 this morning. Gave extra dose 1mg  dilaudid for pain this morning. Has dry mouth from pain medicine. Will change dilaudid to oxycodone given LFT elevation and because Dilaudid is processed via the liver. - Advanced diet to regular as tolerated - Continue NS @ 250 ml/hr (2-3x maintenance rate), reassess after echocardiogram - Discontinue Dilaudid 1mg  q4 hours - Oxycodone 10mg  for mild pain PRN,15mg  for severe pain PRN - Continuous pulse oximetry - Miralax 17g BID to prevent opiod induced constipation - Vital signs per floor - CBC, CMP in am - Advance diet as tolerated  Acute Kidney Injury Cr elevated from 0.93 yesterday to 1.43 this morning. BUN 22. GFR 59. No dysuria. Noted some blood in urine this morning. Question whether this jump in creatinine is due to right-sided heart failure and fluid backup to the liver, thus causing kidney injury. Does not have crackles on exam or appear fluid overloaded. Says he has had issues with flow and hesitation, so we will order a UA and bladder scan to evaluate for infection and/or retention. Will consider adding Flomax. - Urinalysis w/ culture - Bladder scan - Monitor renal function with CMP   Elevated LFTs  Hepatic Steatosis AST/ALT 682/434 and increasing T bili 8.1 this morning, from 873/208 yesterday. Hepatitis panel findings  non-reactive for Hepatitis A, B and C. Acetamophen <10. Abdomen distended and tender to palpation in epigastrium and RUQ. Scleral icterus in bilateral eyes, that wife says she noticed last week.It is likely that the transaminits is likely largely secondary to alcohol use, given prior history of elevated LFTs. His abdomen feels more firm and distended, so we will order a KUB to rule out ileus. If persistent elevation of LFTs, we will consult GI and obtain CT abdomen/pelvis. - KUB - Monitor LFTs with CMP  Acute nondisplaced rib fractures Stable.  - Lidocaine patch PRN - Pain regimen as seen above  ETOH use disorder Mild tremors, diaphoresis. No seizure like activity. CIWA scores 3, 3, 6. - Ativan 1mg  QID - TOC consult for substance abuse resources - Continue CIWA scoring   Hypertension Stable. BP 120s-140s/90s - Hold Lisinopril given AKI - Continue toprol-XL - Vital signs per floor  FEN/GI: Clears, Advance diet as tolerted PPx: Lovenox Dispo:Home pending clinical improvement . Barriers include IV pain medication, IV fluids, PO toleration, pain control..   Subjective:  Ernest Rodgers pain is the same as yesterday rated 7/10. He feels a "fullness" in his abdomen. He is tolerating clears and says he is hungry and would like to eat. He noted "blood" in his urine this morning, that it was dark on the bottom and lighter in color on the top. Says he is having difficulty starting a stream, and continues to have flank pain. He is still having tremors from withdrawal but feels that they are better.  His wife says she noticed worsening yellowness in his eyes last week.  Objective: Temp:  [97.5 F (36.4 C)-97.7 F (36.5 C)] 97.7 F (36.5 C) (08/16 1522) Pulse Rate:  [64-133] 64 (08/16 1522) Resp:  [9-28] 20 (08/16 1522) BP: (100-147)/(82-108) 128/82 (08/16 1522) SpO2:  [83 %-96 %] 90 % (08/16 1522) Physical Exam: General: Sleeping when I entered the room, awakens when I talk to him, no acute  distress, slightly diaphoretic. Falls asleep while I talk with his wife HEENT: Scleral icterus bilateral eyes, slight conjunctival injection. Face flushed, difficult to assess if jaundiced or not due to flushing Cardiovascular: Regular rhythm, no murmurs appreciated Respiratory: CTAB, good aeration throughout. No tachypnea. No increased work of breathing. Abdomen: Obese, slightly distended, no fluid wave to percussion, tenderness to palpation in epigastrium/ RUQ/LUQ, no suprapubic tenderness, normoactive bowel sounds Extremities: Warm, dry, well-perfused Neuro: Slurred speech likely due to dry mouth from opioid pain management, alert, answers questions appropriately  Laboratory: Recent Labs  Lab 11/26/20 0907 11/27/20 0208  WBC 7.5 5.0  HGB 14.6 15.7  HCT 40.8 45.7  PLT 160 128*   Recent Labs  Lab 11/26/20 0907 11/27/20 0208  NA 139 141  K 3.1* 4.1  CL 100 104  CO2 20* 22  BUN 11 17  CREATININE 0.79 0.93  CALCIUM 9.8 9.0  PROT 7.6 6.4*  BILITOT 2.0* 6.5*  ALKPHOS 67 81  ALT 87* 208*  AST 105* 873*  GLUCOSE 103* 172*      Imaging/Diagnostic Tests: No results found.   Darral Dash, DO 11/27/2020, 8:13 PM PGY-1, [facility] Family Medicine FPTS Intern pager: [phone], text pages welcome

## 2020-11-27 NOTE — Progress Notes (Signed)
Pt arrived to unit from ED. Pt alert and oriented x 4. Telemetry and pulse ox monitor connected. Call bell within reach. No needs at this time.

## 2020-11-27 NOTE — Progress Notes (Signed)
FPTS Interim Progress Note  S: Patient was comfortably asleep on rounds.  No concerns voiced from nurse.  O: BP (!) 148/96 (BP Location: Right Arm)   Pulse 100   Temp 98.2 F (36.8 C) (Oral)   Resp 18   Ht 5\' 10"  (1.778 m)   Wt 112 kg   SpO2 95%   BMI 35.44 kg/m     A/P: No changes required medical management at this time.  , DO 11/27/2020, 11:36 PM PGY-1, [facility] Family Medicine Service pager [phone]

## 2020-11-27 NOTE — Progress Notes (Addendum)
Family Medicine Teaching Service Daily Progress Note Intern Pager: [phone]  Patient name: Ernest Rodgers Medical record number: [id number] Date of birth: 1970/10/12 Age: 50 y.o. Gender: male  Primary Care Provider: Pcp, No Consultants: None Code Status: Full  Pt Overview and Major Events to Date:  Admitted 11/26/2020  LOS: 2 days  Assessment and Plan: Ernest Rodgers is a 50 y.o. male presenting with epigastric pain radiating to the back . 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  Acute pancreatitis likely 2/2 to ETOH use Improving. Pain 6/10 this morning. Pain is controlled with current regimen.Tachycardic to 110-130bpm.  Afebrile. Having tremors from alcohol withdrawal. Unlikely to be due to hypertriglyceridemia as lipid panel is largely within normal limits- cholesterol 188, TG 67, LDL 78. Serum lactate 2.3, then 1.7. Lipase decreased from 1290 on admission yesterday to 507 this morning. WBC 5.0 - Advance diet as tolerated - NS @ 250 ml/hr (2-3x maintenance rate) - Change Dilaudid 2 mg 2q hours to 1 mg q 4 hours -Continuous pulse oximetry - Miralax 17g daily to prevent opioid induced constipation - Vital signs per floor - CBC, CMP in am  Elevated LFTs  Hepatic Steatosis Worsening AST/ALT significantly increased from admission at 105/87 to 873/208 this morning. Total bilirubin increasing from 2.0 to 6.5. - monitor LFTs - acetaminophen level - hepatitis panel  Acute nondisplaced rib fractures Stable. Continued pain to palpation over left chest.  - Pain control with regimen as above - Lidocaine patch PRN  ETOH use disorder Having bilateral hand tremors, diaphoresis this morning. Started on scedhuled Ativan 1mg  TID this morning. CIWA- 5 - Ativan 1mg  QID - TOC consult for substance abuse resources. - CIWA scoring  Hypomagnasemia Mg 1.1 overnight, repleted with 4mg .  - Mg in  am  Hypertension Stable. BP 120s-160s/80s-90s. - continue lisinopril, toprol-xl - vitals per floor  GERD Chronic, stable.  - continue pantoprazole  Anxiety and depression - continue Fluoxetine  Lower extremity edema No edema on exam. - Hold lasix  FEN/GI: Advance diet as tolerated PPx: Lovenox Dispo:Home pending clinical improvement . Barriers include IV pain medication, IV fluids, PO toleration, pain control.   Subjective:  Ernest Rodgers was resting comfortably when I entered the room. When he awakened, he was pleasant and conversational. He still has significant pain, rated 6/10 that is improved from yesterday on admission and is responsive to pain medicine. He has not had a bowel movement. He is still having nausea with "dry heaving." He is urinating, and it is dark yellow.He is having bilateral hand tremors, anxiety, and "sweats" which he says is from alcohol withdrawal.  Objective: Temp:  [97.3 F (36.3 C)-97.6 F (36.4 C)] 97.5 F (36.4 C) (08/15 2025) Pulse Rate:  [85-130] 113 (08/16 0900) Resp:  [10-28] 17 (08/16 0900) BP: (100-173)/(82-120) 144/98 (08/16 0900) SpO2:  [91 %-98 %] 93 % (08/16 0900) Weight:  06[birth year]01-1994 kg] 112 kg (08/15 1516) Physical Exam: General: Sleeping when I entered the room, awakens when I talk to him, no acute distress, slightly diaphoretic Cardiovascular: Regular rhythm, tachycardic to 110s, no murmurs appreciated Respiratory: CTAB, good aeration throughout. No tachypnea. No increased work of breathing Abdomen: Obese, soft, continued tenderness ot palpation in epigastrium/left flank. No bruising. Normoactive bowel sounds. Extremities: Warm, dry, well-perfused Neuro: Awake, alert. Answers questions appropriately. No focal neurologic deficits.  Laboratory: Recent Labs  Lab 11/26/20 0907 11/27/20 0208  WBC 7.5 5.0  HGB 14.6 15.7  HCT  40.8 45.7  PLT 160 128*   Recent Labs  Lab 11/26/20 0907 11/27/20 0208  NA 139 141  K 3.1* 4.1  CL 100 104   CO2 20* 22  BUN 11 17  CREATININE 0.79 0.93  CALCIUM 9.8 9.0  PROT 7.6 6.4*  BILITOT 2.0* 6.5*  ALKPHOS 67 81  ALT 87* 208*  AST 105* 873*  GLUCOSE 103* 172*    Lipase     Component Value Date/Time   LIPASE 507 (H) 11/27/2020 0208     Imaging/Diagnostic Tests: DG Ribs Unilateral W/Chest Left  Result Date: 11/26/2020 CLINICAL DATA:  Left-sided rib pain for the past 2 days. EXAM: LEFT RIBS AND CHEST - 3+ VIEW COMPARISON:  None. FINDINGS: Acute nondisplaced fractures of the left anterior sixth through eighth ribs. The heart size and mediastinal contours are within normal limits. No focal consolidation, pleural effusion, or pneumothorax. IMPRESSION: 1. Acute nondisplaced fractures of the left anterior sixth through eighth ribs. Electronically Signed   By: Obie Dredge M.D.   On: 11/26/2020 10:07   CT CHEST ABDOMEN PELVIS W CONTRAST  Result Date: 11/26/2020 CLINICAL DATA:  Left-sided rib pain for the past 2 days.  Trauma. EXAM: CT CHEST, ABDOMEN, AND PELVIS WITH CONTRAST TECHNIQUE: Multidetector CT imaging of the chest, abdomen and pelvis was performed following the standard protocol during bolus administration of intravenous contrast. CONTRAST:  OMNIPAQUE IOHEXOL 350 MG/ML SOLN COMPARISON:  None. FINDINGS: CHEST: Ports and Devices: None. Lungs/airways: No focal consolidation. Couple scattered pulmonary micronodules. No pulmonary mass. No pulmonary contusion or laceration. No pneumatocele formation. The central airways are patent. Pleura: No pleural effusion. No pneumothorax. No hemothorax. Lymph Nodes: No mediastinal, hilar, or axillary lymphadenopathy. Mediastinum: No pneumomediastinum. No aortic injury or mediastinal hematoma. The thoracic aorta is normal in caliber. The heart is normal in size. No significant pericardial effusion. At least 2 vessel coronary calcifications. The esophagus is unremarkable. The thyroid is unremarkable. Chest Wall / Breasts: No chest wall mass.  Musculoskeletal: Acute nondisplaced left seventh rib fracture. No sternal fracture. No spinal fracture. ABDOMEN / PELVIS: Liver: The hepatic parenchyma is diffusely hypodense compared to the splenic parenchyma consistent with fatty infiltration. No focal liver abnormality. No laceration or subcapsular hematoma. Biliary System: The gallbladder is otherwise unremarkable with no radio-opaque gallstones. No biliary ductal dilatation. Pancreas: Hazy contour of the pancreatic parenchyma diffusely with associated peripancreatic fat stranding and free fluid. No main pancreatic duct dilatation. No pseudocyst formation. Spleen: Not enlarged. No focal lesion. No laceration, subcapsular hematoma, or vascular injury. Adrenal Glands: No nodularity bilaterally. Kidneys: Bilateral kidneys enhance symmetrically. No hydronephrosis. No contusion, laceration, or subcapsular hematoma. No injury to the vascular structures or collecting systems. No hydroureter. The urinary bladder is unremarkable. On delayed imaging, there is no urothelial wall thickening and there are no filling defects in the opacified portions of the bilateral collecting systems or ureters. Bowel: Roux-en-Y gastric surgical changes identified. Haziness of the duodenal wall with associated surrounding fat stranding. No large bowel wall thickening or dilatation. Few scattered colonic diverticula. Mesentery, Omentum, and Peritoneum: No simple free fluid ascites. No pneumoperitoneum. No hemoperitoneum. No mesenteric hematoma identified. No organized fluid collection. Pelvic Organs: Normal. Lymph Nodes: No abdominal, pelvic, inguinal lymphadenopathy. Vasculature: Mild atherosclerotic plaque. No abdominal aorta or iliac aneurysm. No active contrast extravasation or pseudoaneurysm. Musculoskeletal: No significant soft tissue hematoma. No acute pelvic fracture. No spinal fracture. IMPRESSION: 1. Acute pancreatitis. 2. Likely associated reactive changes of duodenum. 3. Hepatic  steatosis. 4. Acute nondisplaced left  seventh rib fracture. 5. Otherwise: No acute traumatic injury to the chest, abdomen, or pelvis. No acute fracture or traumatic malalignment of the thoracic or lumbar spine. 6. Other imaging findings of potential clinical significance: Status post cholecystectomy and gastric surgical changes. Scattered colonic diverticulosis with no acute diverticulitis. Aortic Atherosclerosis (ICD10-I70.0). Electronically Signed   By: Tish Frederickson M.D.   On: 11/26/2020 16:15     Darral Dash, DO 11/27/2020, 9:23 AM PGY-1, [place] [facility] FPTS Intern pager: [phone], text pages welcome

## 2020-11-28 ENCOUNTER — Inpatient Hospital Stay (HOSPITAL_COMMUNITY): Payer: Managed Care, Other (non HMO)

## 2020-11-28 ENCOUNTER — Encounter (HOSPITAL_COMMUNITY): Payer: Self-pay | Admitting: Student

## 2020-11-28 DIAGNOSIS — R319 Hematuria, unspecified: Secondary | ICD-10-CM

## 2020-11-28 DIAGNOSIS — K859 Acute pancreatitis without necrosis or infection, unspecified: Secondary | ICD-10-CM | POA: Diagnosis not present

## 2020-11-28 DIAGNOSIS — F10939 Alcohol use, unspecified with withdrawal, unspecified: Secondary | ICD-10-CM | POA: Diagnosis present

## 2020-11-28 DIAGNOSIS — I10 Essential (primary) hypertension: Secondary | ICD-10-CM

## 2020-11-28 DIAGNOSIS — N179 Acute kidney failure, unspecified: Secondary | ICD-10-CM | POA: Diagnosis not present

## 2020-11-28 DIAGNOSIS — F10230 Alcohol dependence with withdrawal, uncomplicated: Secondary | ICD-10-CM | POA: Diagnosis not present

## 2020-11-28 DIAGNOSIS — F10239 Alcohol dependence with withdrawal, unspecified: Secondary | ICD-10-CM | POA: Diagnosis present

## 2020-11-28 LAB — COMPREHENSIVE METABOLIC PANEL
ALT: 434 U/L — ABNORMAL HIGH (ref 0–44)
AST: 682 U/L — ABNORMAL HIGH (ref 15–41)
Albumin: 3 g/dL — ABNORMAL LOW (ref 3.5–5.0)
Alkaline Phosphatase: 90 U/L (ref 38–126)
Anion gap: 12 (ref 5–15)
BUN: 22 mg/dL — ABNORMAL HIGH (ref 6–20)
CO2: 25 mmol/L (ref 22–32)
Calcium: 8.6 mg/dL — ABNORMAL LOW (ref 8.9–10.3)
Chloride: 101 mmol/L (ref 98–111)
Creatinine, Ser: 1.43 mg/dL — ABNORMAL HIGH (ref 0.61–1.24)
GFR, Estimated: 59 mL/min — ABNORMAL LOW (ref >60)
Glucose, Bld: 122 mg/dL — ABNORMAL HIGH (ref 70–99)
Potassium: 4.3 mmol/L (ref 3.5–5.1)
Sodium: 138 mmol/L (ref 135–145)
Total Bilirubin: 8.1 mg/dL — ABNORMAL HIGH (ref 0.3–1.2)
Total Protein: 5.8 g/dL — ABNORMAL LOW (ref 6.5–8.1)

## 2020-11-28 LAB — URINALYSIS, ROUTINE W REFLEX MICROSCOPIC
Glucose, UA: NEGATIVE mg/dL
Ketones, ur: 5 mg/dL — AB
Leukocytes,Ua: NEGATIVE
Nitrite: NEGATIVE
Protein, ur: 30 mg/dL — AB
Specific Gravity, Urine: 1.025 (ref 1.005–1.030)
pH: 5 (ref 5.0–8.0)

## 2020-11-28 LAB — ECHOCARDIOGRAM COMPLETE
Area-P 1/2: 3.93 cm2
Height: 70 in
S' Lateral: 3.6 cm
Weight: 3952 oz

## 2020-11-28 LAB — BILIRUBIN, DIRECT: Bilirubin, Direct: 5.2 mg/dL — ABNORMAL HIGH (ref 0.0–0.2)

## 2020-11-28 LAB — CREATININE, URINE, RANDOM: Creatinine, Urine: 130.47 mg/dL

## 2020-11-28 LAB — MAGNESIUM: Magnesium: 1.9 mg/dL (ref 1.7–2.4)

## 2020-11-28 LAB — SODIUM, URINE, RANDOM: Sodium, Ur: <10 mmol/L

## 2020-11-28 LAB — LIPASE, BLOOD: Lipase: 1290 U/L — ABNORMAL HIGH (ref 11–51)

## 2020-11-28 LAB — CK: Total CK: 181 U/L (ref 49–397)

## 2020-11-28 MED ORDER — ALBUTEROL SULFATE (2.5 MG/3ML) 0.083% IN NEBU
2.5000 mg | INHALATION_SOLUTION | Freq: Once | RESPIRATORY_TRACT | Status: AC
  Administered 2020-11-28: 2.5 mg via RESPIRATORY_TRACT
  Filled 2020-11-28: qty 3

## 2020-11-28 MED ORDER — OXYCODONE HCL 5 MG PO TABS: 15.0000 mg | ORAL_TABLET | ORAL | Status: DC | PRN

## 2020-11-28 MED ORDER — OXYCODONE HCL 5 MG PO TABS: 10.0000 mg | ORAL_TABLET | ORAL | Status: DC | PRN

## 2020-11-28 MED ORDER — SENNA 8.6 MG PO TABS
1.0000 | ORAL_TABLET | Freq: Every day | ORAL | Status: DC
  Administered 2020-11-28: 8.6 mg via ORAL
  Filled 2020-11-28: qty 1

## 2020-11-28 MED ORDER — POLYETHYLENE GLYCOL 3350 17 G PO PACK
17.0000 g | PACK | Freq: Two times a day (BID) | ORAL | Status: DC
  Administered 2020-11-28: 17 g via ORAL
  Filled 2020-11-28: qty 1

## 2020-11-28 MED ORDER — HYDROMORPHONE HCL 1 MG/ML IJ SOLN
1.0000 mg | Freq: Once | INTRAMUSCULAR | Status: AC
Start: 2020-11-28 — End: 2020-11-28
  Administered 2020-11-28: 1 mg via INTRAVENOUS
  Filled 2020-11-28: qty 1

## 2020-11-28 NOTE — Progress Notes (Signed)
FPTS Interim Progress Note  S: Saw patient at bedside.  Nurse states he has been agitated all night and she was given 2 mg Ativan per CIWA score.  Nurse states she talked with wife of patient over the phone who stated he generally has delirium tremens on the third day of his withdrawal.  Stated he was having trouble breathing.  Denied hallucinations.  O: BP (!) 170/105 (BP Location: Right Wrist)   Pulse (!) 106   Temp 98.6 F (37 C) (Oral)   Resp 16   Ht 5\' 10"  (1.778 m)   Wt 112 kg   SpO2 95%   BMI 35.44 kg/m   Gen: alert and oriented to person, place and time, no acute distress, appears agitated CV: RRR, no murmurs auscultated Pulm: increased work of breathing, wheezing appreciated, no crackles. [place] in place on 3L Abdomen: pain along left ribs 6-8, diffuse abdominal pain, normoactive bowel sounds Extremities: 2+ tibial and radial pulses, no calf pain or pitting edema, no tremors appreciated Psych: agitated, able to be redirected, no hallucinations or delusions   A/P: Delirium tremens 2/2 alcohol withdrawal Patient is actively scoring increasingly higher on CIWA scoring than previously.  Previous scores were 1 and 4.  Scoring 9 in room.  Advised nurse to have low threshold for paging FM team.  -Updated to watcher status -Continue CIWA protocol and dose Ativan accordingly -Continue to monitor overnight  Shortness of breath Patient presenting with new onset difficulty breathing and wheezing.  Not concerned for PE at this time as patient is actively receiving Lovenox.  Echo earlier today noted LVEF 60-65% with mild LVH, grade 1 diastolic dysfunction.  Patient also does not have any pitting edema.  Not concerned for fluid overload at this time.  Chest x-ray notable for no active disease.  Low lung volumes with subsegmental atelectasis left base.  Patient does not actively smoke and does not have asthma.  Patient is receiving scheduled Protonix, unlikely to be active reflux  disease. -Monitor oxygen status and respiratory rate. -modified fluids to maintenance dosing @152  mL/hr -Advise incentive spirometer in the future when mental status returns to normal  , DO 11/28/2020, 11:14 PM PGY-1, [facility] Service pager [phone]

## 2020-11-28 NOTE — Plan of Care (Signed)
  Problem: Health Behavior/Discharge Planning: Goal: Ability to manage health-related needs will improve Outcome: Progressing   Problem: Clinical Measurements: Goal: Will remain free from infection Outcome: Progressing   Problem: Activity: Goal: Risk for activity intolerance will decrease Outcome: Progressing   Problem: Nutrition: Goal: Adequate nutrition will be maintained Outcome: Progressing   Problem: Coping: Goal: Level of anxiety will decrease Outcome: Progressing   Problem: Elimination: Goal: Will not experience complications related to bowel motility Outcome: Progressing   Problem: Pain Managment: Goal: General experience of comfort will improve Outcome: Progressing

## 2020-11-28 NOTE — TOC CAGE-AID Note (Signed)
Transition of Care [facility]) - CAGE-AID Screening   Patient Details  Name: Pace Lamadrid MRN: [id number] Date of Birth: 04-02-1971  Transition of Care [facility]) CM/SW Contact:    Milinda Antis, [place] Phone Number: 11/28/2020, 11:38 AM   Clinical Narrative: CSW met with the patient and patient's spouse at bedside.  The spouse reports that the patient drinks daily and it has become an issue and leads to arguments between the two of them.  The patient reports that he realizes that there is an issue with excessively drinking alcohol and would like to stop.  Patient reports that he went from drinking approximately 4 shots "a tumbler" at at time down to 2 shots.  Patient and wife plan to attend sessions together.     SA resources provided.   CAGE-AID Screening:    Have You Ever Felt You Ought to Cut Down on Your Drinking or Drug Use?: Yes Have People Annoyed You By Critizing Your Drinking Or Drug Use?: No Have You Felt Bad Or Guilty About Your Drinking Or Drug Use?: Yes Have You Ever Had a Drink or Used Drugs First Thing In The Morning to Steady Your Nerves or to Get Rid of a Hangover?: Yes CAGE-AID Score: 3  Substance Abuse Education Offered: Yes  Substance abuse interventions: Patient Counseling, Patient and Family Counseling, Scientist, clinical (histocompatibility and immunogenetics)

## 2020-11-28 NOTE — Progress Notes (Signed)
  Echocardiogram 2D Echocardiogram has been performed.  Linlee Cromie G Kaceton Vieau 11/28/2020, 1:22 PM

## 2020-11-29 ENCOUNTER — Inpatient Hospital Stay (HOSPITAL_COMMUNITY): Payer: Managed Care, Other (non HMO)

## 2020-11-29 DIAGNOSIS — G9341 Metabolic encephalopathy: Secondary | ICD-10-CM

## 2020-11-29 DIAGNOSIS — J9600 Acute respiratory failure, unspecified whether with hypoxia or hypercapnia: Secondary | ICD-10-CM | POA: Diagnosis not present

## 2020-11-29 DIAGNOSIS — K859 Acute pancreatitis without necrosis or infection, unspecified: Secondary | ICD-10-CM | POA: Diagnosis not present

## 2020-11-29 DIAGNOSIS — R400 Somnolence: Secondary | ICD-10-CM | POA: Diagnosis not present

## 2020-11-29 DIAGNOSIS — R109 Unspecified abdominal pain: Secondary | ICD-10-CM

## 2020-11-29 DIAGNOSIS — R0603 Acute respiratory distress: Secondary | ICD-10-CM | POA: Diagnosis not present

## 2020-11-29 DIAGNOSIS — F10230 Alcohol dependence with withdrawal, uncomplicated: Secondary | ICD-10-CM | POA: Diagnosis not present

## 2020-11-29 DIAGNOSIS — J9601 Acute respiratory failure with hypoxia: Secondary | ICD-10-CM

## 2020-11-29 DIAGNOSIS — J9602 Acute respiratory failure with hypercapnia: Secondary | ICD-10-CM

## 2020-11-29 DIAGNOSIS — R1084 Generalized abdominal pain: Secondary | ICD-10-CM

## 2020-11-29 DIAGNOSIS — F10231 Alcohol dependence with withdrawal delirium: Secondary | ICD-10-CM | POA: Diagnosis not present

## 2020-11-29 LAB — COMPREHENSIVE METABOLIC PANEL
ALT: 319 U/L — ABNORMAL HIGH (ref 0–44)
AST: 383 U/L — ABNORMAL HIGH (ref 15–41)
Albumin: 2.5 g/dL — ABNORMAL LOW (ref 3.5–5.0)
Alkaline Phosphatase: 95 U/L (ref 38–126)
Anion gap: 19 — ABNORMAL HIGH (ref 5–15)
BUN: 13 mg/dL (ref 6–20)
CO2: 17 mmol/L — ABNORMAL LOW (ref 22–32)
Calcium: 8.6 mg/dL — ABNORMAL LOW (ref 8.9–10.3)
Chloride: 102 mmol/L (ref 98–111)
Creatinine, Ser: 0.85 mg/dL (ref 0.61–1.24)
GFR, Estimated: >60 mL/min (ref >60)
Glucose, Bld: 91 mg/dL (ref 70–99)
Potassium: 3.4 mmol/L — ABNORMAL LOW (ref 3.5–5.1)
Sodium: 138 mmol/L (ref 135–145)
Total Bilirubin: 7.7 mg/dL — ABNORMAL HIGH (ref 0.3–1.2)
Total Protein: 5.1 g/dL — ABNORMAL LOW (ref 6.5–8.1)

## 2020-11-29 LAB — POCT I-STAT 7, (LYTES, BLD GAS, ICA,H+H)
Acid-base deficit: 6 mmol/L — ABNORMAL HIGH (ref 0.0–2.0)
Acid-base deficit: 3 mmol/L — ABNORMAL HIGH (ref 0.0–2.0)
Bicarbonate: 21.7 mmol/L (ref 20.0–28.0)
Bicarbonate: 22.5 mmol/L (ref 20.0–28.0)
Calcium, Ion: 1.21 mmol/L (ref 1.15–1.40)
Calcium, Ion: 1.17 mmol/L (ref 1.15–1.40)
HCT: 42 % (ref 39.0–52.0)
HCT: 36 % — ABNORMAL LOW (ref 39.0–52.0)
Hemoglobin: 14.3 g/dL (ref 13.0–17.0)
Hemoglobin: 12.2 g/dL — ABNORMAL LOW (ref 13.0–17.0)
O2 Saturation: 87 %
O2 Saturation: 98 %
Patient temperature: 98.5
Patient temperature: 99
Potassium: 3.2 mmol/L — ABNORMAL LOW (ref 3.5–5.1)
Potassium: 3.1 mmol/L — ABNORMAL LOW (ref 3.5–5.1)
Sodium: 139 mmol/L (ref 135–145)
Sodium: 140 mmol/L (ref 135–145)
TCO2: 23 mmol/L (ref 22–32)
TCO2: 24 mmol/L (ref 22–32)
pCO2 arterial: 52.2 mmHg — ABNORMAL HIGH (ref 32.0–48.0)
pCO2 arterial: 41.3 mmHg (ref 32.0–48.0)
pH, Arterial: 7.225 — ABNORMAL LOW (ref 7.350–7.450)
pH, Arterial: 7.345 — ABNORMAL LOW (ref 7.350–7.450)
pO2, Arterial: 63 mmHg — ABNORMAL LOW (ref 83.0–108.0)
pO2, Arterial: 105 mmHg (ref 83.0–108.0)

## 2020-11-29 LAB — PHOSPHORUS
Phosphorus: 3.5 mg/dL (ref 2.5–4.6)
Phosphorus: 3.5 mg/dL (ref 2.5–4.6)

## 2020-11-29 LAB — BASIC METABOLIC PANEL
Anion gap: 16 — ABNORMAL HIGH (ref 5–15)
BUN: 17 mg/dL (ref 6–20)
CO2: 20 mmol/L — ABNORMAL LOW (ref 22–32)
Calcium: 8.3 mg/dL — ABNORMAL LOW (ref 8.9–10.3)
Chloride: 104 mmol/L (ref 98–111)
Creatinine, Ser: 1.09 mg/dL (ref 0.61–1.24)
GFR, Estimated: >60 mL/min (ref >60)
Glucose, Bld: 136 mg/dL — ABNORMAL HIGH (ref 70–99)
Potassium: 3.7 mmol/L (ref 3.5–5.1)
Sodium: 140 mmol/L (ref 135–145)

## 2020-11-29 LAB — CBC WITH DIFFERENTIAL/PLATELET
Abs Immature Granulocytes: 0.23 10*3/uL — ABNORMAL HIGH (ref 0.00–0.07)
Basophils Absolute: 0 10*3/uL (ref 0.0–0.1)
Basophils Relative: 1 %
Eosinophils Absolute: 0 10*3/uL (ref 0.0–0.5)
Eosinophils Relative: 0 %
HCT: 34.4 % — ABNORMAL LOW (ref 39.0–52.0)
Hemoglobin: 12 g/dL — ABNORMAL LOW (ref 13.0–17.0)
Immature Granulocytes: 4 %
Lymphocytes Relative: 8 %
Lymphs Abs: 0.5 10*3/uL — ABNORMAL LOW (ref 0.7–4.0)
MCH: 37.3 pg — ABNORMAL HIGH (ref 26.0–34.0)
MCHC: 34.9 g/dL (ref 30.0–36.0)
MCV: 106.8 fL — ABNORMAL HIGH (ref 80.0–100.0)
Monocytes Absolute: 0.8 10*3/uL (ref 0.1–1.0)
Monocytes Relative: 15 %
Neutro Abs: 3.9 10*3/uL (ref 1.7–7.7)
Neutrophils Relative %: 72 %
Platelets: 82 10*3/uL — ABNORMAL LOW (ref 150–400)
RBC: 3.22 MIL/uL — ABNORMAL LOW (ref 4.22–5.81)
RDW: 14.7 % (ref 11.5–15.5)
WBC: 5.4 10*3/uL (ref 4.0–10.5)
nRBC: 0.7 % — ABNORMAL HIGH (ref 0.0–0.2)

## 2020-11-29 LAB — GLUCOSE, CAPILLARY
Glucose-Capillary: 114 mg/dL — ABNORMAL HIGH (ref 70–99)
Glucose-Capillary: 122 mg/dL — ABNORMAL HIGH (ref 70–99)
Glucose-Capillary: 120 mg/dL — ABNORMAL HIGH (ref 70–99)
Glucose-Capillary: 132 mg/dL — ABNORMAL HIGH (ref 70–99)
Glucose-Capillary: 133 mg/dL — ABNORMAL HIGH (ref 70–99)
Glucose-Capillary: 158 mg/dL — ABNORMAL HIGH (ref 70–99)

## 2020-11-29 LAB — BRAIN NATRIURETIC PEPTIDE: B Natriuretic Peptide: 693.8 pg/mL — ABNORMAL HIGH (ref 0.0–100.0)

## 2020-11-29 LAB — MAGNESIUM
Magnesium: 2.1 mg/dL (ref 1.7–2.4)
Magnesium: 2.4 mg/dL (ref 1.7–2.4)

## 2020-11-29 LAB — AMMONIA: Ammonia: 75 umol/L — ABNORMAL HIGH (ref 9–35)

## 2020-11-29 LAB — VITAMIN D 25 HYDROXY (VIT D DEFICIENCY, FRACTURES): Vit D, 25-Hydroxy: 33.01 ng/mL (ref 30–100)

## 2020-11-29 LAB — MRSA NEXT GEN BY PCR, NASAL: MRSA by PCR Next Gen: DETECTED — AB

## 2020-11-29 MED ORDER — DEXMEDETOMIDINE HCL IN NACL 400 MCG/100ML IV SOLN
0.4000 ug/kg/h | INTRAVENOUS | Status: DC
Start: 2020-11-29 — End: 2020-12-04
  Administered 2020-11-29: 0.4 ug/kg/h via INTRAVENOUS
  Administered 2020-11-30: 1 ug/kg/h via INTRAVENOUS
  Administered 2020-11-30: 0.7 ug/kg/h via INTRAVENOUS
  Administered 2020-11-30 (×2): 1 ug/kg/h via INTRAVENOUS
  Administered 2020-12-01: 1.1 ug/kg/h via INTRAVENOUS
  Administered 2020-12-01 (×4): 1 ug/kg/h via INTRAVENOUS
  Administered 2020-12-02: 1.2 ug/kg/h via INTRAVENOUS
  Administered 2020-12-02: 1 ug/kg/h via INTRAVENOUS
  Administered 2020-12-02: 1.2 ug/kg/h via INTRAVENOUS
  Administered 2020-12-02 – 2020-12-03 (×10): 1 ug/kg/h via INTRAVENOUS
  Administered 2020-12-04 (×2): 0.8 ug/kg/h via INTRAVENOUS
  Filled 2020-11-29: qty 100
  Filled 2020-11-29: qty 200
  Filled 2020-11-29: qty 100
  Filled 2020-11-29 (×2): qty 200
  Filled 2020-11-29 (×3): qty 100
  Filled 2020-11-29 (×2): qty 200
  Filled 2020-11-29 (×4): qty 100
  Filled 2020-11-29: qty 200
  Filled 2020-11-29 (×2): qty 100
  Filled 2020-11-29: qty 200
  Filled 2020-11-29 (×3): qty 100

## 2020-11-29 MED ORDER — SODIUM CHLORIDE 0.9 % IV SOLN: INTRAVENOUS | Status: DC

## 2020-11-29 MED ORDER — ADULT MULTIVITAMIN W/MINERALS CH
1.0000 | ORAL_TABLET | Freq: Every day | ORAL | Status: DC
  Administered 2020-11-29 – 2020-12-01 (×3): 1
  Filled 2020-11-29 (×3): qty 1

## 2020-11-29 MED ORDER — LORAZEPAM 0.5 MG PO TABS
0.5000 mg | ORAL_TABLET | Freq: Three times a day (TID) | ORAL | Status: DC
  Administered 2020-11-29 – 2020-11-30 (×3): 0.5 mg
  Filled 2020-11-29 (×3): qty 1

## 2020-11-29 MED ORDER — ORAL CARE MOUTH RINSE
15.0000 mL | OROMUCOSAL | Status: DC
  Administered 2020-11-29 – 2020-12-04 (×54): 15 mL via OROMUCOSAL

## 2020-11-29 MED ORDER — PANTOPRAZOLE SODIUM 40 MG IV SOLR
40.0000 mg | INTRAVENOUS | Status: DC
  Administered 2020-11-29 – 2020-12-08 (×10): 40 mg via INTRAVENOUS
  Filled 2020-11-29 (×10): qty 40

## 2020-11-29 MED ORDER — FUROSEMIDE 10 MG/ML IJ SOLN
40.0000 mg | Freq: Once | INTRAMUSCULAR | Status: AC
  Administered 2020-11-29: 40 mg via INTRAVENOUS
  Filled 2020-11-29: qty 4

## 2020-11-29 MED ORDER — FENTANYL CITRATE (PF) 100 MCG/2ML IJ SOLN
INTRAMUSCULAR | Status: AC
  Administered 2020-11-29: 200 ug via INTRAVENOUS
  Filled 2020-11-29: qty 4

## 2020-11-29 MED ORDER — IPRATROPIUM-ALBUTEROL 0.5-2.5 (3) MG/3ML IN SOLN
RESPIRATORY_TRACT | Status: AC
  Administered 2020-11-29: 3 mL
  Filled 2020-11-29: qty 3

## 2020-11-29 MED ORDER — POTASSIUM CHLORIDE 20 MEQ PO PACK
40.0000 meq | PACK | ORAL | Status: AC
Start: 2020-11-29 — End: 2020-11-29
  Administered 2020-11-29 (×2): 40 meq
  Filled 2020-11-29 (×2): qty 2

## 2020-11-29 MED ORDER — PROSOURCE TF PO LIQD
45.0000 mL | Freq: Four times a day (QID) | ORAL | Status: DC
  Administered 2020-11-29 – 2020-12-01 (×7): 45 mL
  Filled 2020-11-29 (×8): qty 45

## 2020-11-29 MED ORDER — PHENYLEPHRINE 40 MCG/ML (10ML) SYRINGE FOR IV PUSH (FOR BLOOD PRESSURE SUPPORT)
PREFILLED_SYRINGE | INTRAVENOUS | Status: AC
  Administered 2020-11-29: 160 ug via INTRAVENOUS
  Filled 2020-11-29: qty 10

## 2020-11-29 MED ORDER — ALBUTEROL SULFATE (2.5 MG/3ML) 0.083% IN NEBU: 2.5000 mg | INHALATION_SOLUTION | Freq: Once | RESPIRATORY_TRACT | Status: DC

## 2020-11-29 MED ORDER — FENTANYL CITRATE (PF) 100 MCG/2ML IJ SOLN: 200.0000 ug | Freq: Once | INTRAMUSCULAR | Status: AC

## 2020-11-29 MED ORDER — POLYETHYLENE GLYCOL 3350 17 G PO PACK
17.0000 g | PACK | Freq: Two times a day (BID) | ORAL | Status: DC
Start: 2020-11-29 — End: 2020-12-02
  Administered 2020-11-29 – 2020-12-01 (×5): 17 g
  Filled 2020-11-29 (×7): qty 1

## 2020-11-29 MED ORDER — METOPROLOL TARTRATE 25 MG PO TABS
25.0000 mg | ORAL_TABLET | Freq: Two times a day (BID) | ORAL | Status: DC
  Administered 2020-11-30 – 2020-12-01 (×3): 25 mg
  Filled 2020-11-29 (×4): qty 1

## 2020-11-29 MED ORDER — IPRATROPIUM-ALBUTEROL 0.5-2.5 (3) MG/3ML IN SOLN
3.0000 mL | RESPIRATORY_TRACT | Status: DC | PRN
  Administered 2020-12-04 – 2020-12-11 (×3): 3 mL via RESPIRATORY_TRACT
  Filled 2020-11-29 (×3): qty 3

## 2020-11-29 MED ORDER — FUROSEMIDE 10 MG/ML IJ SOLN
INTRAMUSCULAR | Status: AC
  Filled 2020-11-29: qty 2

## 2020-11-29 MED ORDER — FENTANYL 2500MCG IN NS 250ML (10MCG/ML) PREMIX INFUSION
50.0000 ug/h | INTRAVENOUS | Status: DC
  Administered 2020-11-29: 200 ug/h via INTRAVENOUS
  Administered 2020-11-29: 100 ug/h via INTRAVENOUS
  Administered 2020-11-30: 200 ug/h via INTRAVENOUS
  Administered 2020-11-30: 150 ug/h via INTRAVENOUS
  Administered 2020-12-01: 125 ug/h via INTRAVENOUS
  Administered 2020-12-02 (×2): 175 ug/h via INTRAVENOUS
  Administered 2020-12-03: 150 ug/h via INTRAVENOUS
  Administered 2020-12-03: 175 ug/h via INTRAVENOUS
  Filled 2020-11-29 (×11): qty 250

## 2020-11-29 MED ORDER — FUROSEMIDE 10 MG/ML IJ SOLN
40.0000 mg | Freq: Once | INTRAMUSCULAR | Status: AC
  Administered 2020-11-29: 40 mg via INTRAVENOUS

## 2020-11-29 MED ORDER — POLYETHYLENE GLYCOL 3350 17 G PO PACK: 17.0000 g | PACK | Freq: Every day | ORAL | Status: DC

## 2020-11-29 MED ORDER — MUPIROCIN 2 % EX OINT
1.0000 "application " | TOPICAL_OINTMENT | Freq: Two times a day (BID) | CUTANEOUS | Status: AC
  Administered 2020-11-29 – 2020-12-03 (×10): 1 via NASAL
  Filled 2020-11-29 (×3): qty 22

## 2020-11-29 MED ORDER — FENTANYL CITRATE (PF) 100 MCG/2ML IJ SOLN
50.0000 ug | Freq: Once | INTRAMUSCULAR | Status: AC
  Administered 2020-11-29: 50 ug via INTRAVENOUS

## 2020-11-29 MED ORDER — CHLORHEXIDINE GLUCONATE 0.12% ORAL RINSE (MEDLINE KIT)
15.0000 mL | Freq: Two times a day (BID) | OROMUCOSAL | Status: DC
Start: 2020-11-29 — End: 2020-12-10
  Administered 2020-11-29 – 2020-12-09 (×22): 15 mL via OROMUCOSAL

## 2020-11-29 MED ORDER — MIDAZOLAM-SODIUM CHLORIDE 100-0.9 MG/100ML-% IV SOLN
0.0000 mg/h | INTRAVENOUS | Status: DC
  Administered 2020-11-29: 2 mg/h via INTRAVENOUS
  Filled 2020-11-29 (×2): qty 100

## 2020-11-29 MED ORDER — FLUOXETINE HCL 20 MG PO CAPS
20.0000 mg | ORAL_CAPSULE | Freq: Every day | ORAL | Status: DC
  Administered 2020-11-29 – 2020-12-01 (×3): 20 mg
  Filled 2020-11-29: qty 1
  Filled 2020-11-29: qty 2
  Filled 2020-11-29 (×2): qty 1
  Filled 2020-11-29 (×2): qty 2

## 2020-11-29 MED ORDER — FUROSEMIDE 10 MG/ML IJ SOLN
INTRAMUSCULAR | Status: AC
  Filled 2020-11-29: qty 4

## 2020-11-29 MED ORDER — PROSOURCE TF PO LIQD
45.0000 mL | Freq: Two times a day (BID) | ORAL | Status: DC
  Filled 2020-11-29: qty 45

## 2020-11-29 MED ORDER — VITAL 1.5 CAL PO LIQD
1000.0000 mL | ORAL | Status: DC
  Administered 2020-11-29: 1000 mL

## 2020-11-29 MED ORDER — SODIUM CHLORIDE 0.9 % IV BOLUS
500.0000 mL | Freq: Once | INTRAVENOUS | Status: AC
  Administered 2020-11-29: 500 mL via INTRAVENOUS

## 2020-11-29 MED ORDER — LACTULOSE 10 GM/15ML PO SOLN
20.0000 g | Freq: Two times a day (BID) | ORAL | Status: DC
  Administered 2020-11-29 – 2020-12-01 (×4): 20 g
  Filled 2020-11-29 (×4): qty 30

## 2020-11-29 MED ORDER — ROCURONIUM BROMIDE 10 MG/ML (PF) SYRINGE
PREFILLED_SYRINGE | INTRAVENOUS | Status: AC
  Administered 2020-11-29: 100 mg via INTRAVENOUS
  Filled 2020-11-29: qty 10

## 2020-11-29 MED ORDER — PHENYLEPHRINE 40 MCG/ML (10ML) SYRINGE FOR IV PUSH (FOR BLOOD PRESSURE SUPPORT): 160.0000 ug | PREFILLED_SYRINGE | Freq: Once | INTRAVENOUS | Status: AC | PRN

## 2020-11-29 MED ORDER — KETAMINE HCL 50 MG/5ML IJ SOSY
PREFILLED_SYRINGE | INTRAMUSCULAR | Status: AC
  Administered 2020-11-29: 50 mg via INTRAVENOUS
  Filled 2020-11-29: qty 5

## 2020-11-29 MED ORDER — THIAMINE HCL 100 MG PO TABS
100.0000 mg | ORAL_TABLET | Freq: Every day | ORAL | Status: DC
  Administered 2020-11-30 – 2020-12-01 (×2): 100 mg
  Filled 2020-11-29 (×2): qty 1

## 2020-11-29 MED ORDER — FENTANYL BOLUS VIA INFUSION
50.0000 ug | INTRAVENOUS | Status: DC | PRN
  Administered 2020-11-29 (×2): 50 ug via INTRAVENOUS
  Administered 2020-11-29 – 2020-12-02 (×9): 100 ug via INTRAVENOUS
  Administered 2020-12-03 (×4): 50 ug via INTRAVENOUS
  Administered 2020-12-03 (×2): 100 ug via INTRAVENOUS
  Filled 2020-11-29: qty 100

## 2020-11-29 MED ORDER — ROCURONIUM BROMIDE 50 MG/5ML IV SOLN
100.0000 mg | Freq: Once | INTRAVENOUS | Status: AC
  Filled 2020-11-29: qty 10

## 2020-11-29 MED ORDER — MIDAZOLAM HCL 2 MG/2ML IJ SOLN: 4.0000 mg | Freq: Once | INTRAMUSCULAR | Status: AC

## 2020-11-29 MED ORDER — IPRATROPIUM-ALBUTEROL 0.5-2.5 (3) MG/3ML IN SOLN: 3.0000 mL | RESPIRATORY_TRACT | Status: DC

## 2020-11-29 MED ORDER — FOLIC ACID 1 MG PO TABS
1.0000 mg | ORAL_TABLET | Freq: Every day | ORAL | Status: DC
  Administered 2020-11-29 – 2020-12-01 (×3): 1 mg
  Filled 2020-11-29 (×3): qty 1

## 2020-11-29 MED ORDER — MAGNESIUM SULFATE 2 GM/50ML IV SOLN
2.0000 g | Freq: Once | INTRAVENOUS | Status: AC
  Administered 2020-11-29: 2 g via INTRAVENOUS
  Filled 2020-11-29: qty 50

## 2020-11-29 MED ORDER — KETAMINE HCL 50 MG/5ML IJ SOSY: 50.0000 mg | PREFILLED_SYRINGE | Freq: Once | INTRAMUSCULAR | Status: AC

## 2020-11-29 MED ORDER — DOCUSATE SODIUM 50 MG/5ML PO LIQD
100.0000 mg | Freq: Two times a day (BID) | ORAL | Status: DC
  Administered 2020-11-29 – 2020-12-01 (×5): 100 mg
  Filled 2020-11-29 (×7): qty 10

## 2020-11-29 MED ORDER — MIDAZOLAM HCL 2 MG/2ML IJ SOLN
INTRAMUSCULAR | Status: AC
  Administered 2020-11-29: 4 mg via INTRAVENOUS
  Filled 2020-11-29: qty 4

## 2020-11-29 MED ORDER — ENOXAPARIN SODIUM 60 MG/0.6ML IJ SOSY
55.0000 mg | PREFILLED_SYRINGE | Freq: Every day | INTRAMUSCULAR | Status: DC
  Administered 2020-11-29 – 2020-12-02 (×4): 55 mg via SUBCUTANEOUS
  Filled 2020-11-29 (×4): qty 0.55

## 2020-11-29 MED ORDER — CHLORHEXIDINE GLUCONATE CLOTH 2 % EX PADS
6.0000 | MEDICATED_PAD | Freq: Every day | CUTANEOUS | Status: DC
  Administered 2020-11-29 – 2020-12-13 (×14): 6 via TOPICAL

## 2020-11-29 MED ORDER — FUROSEMIDE 10 MG/ML IJ SOLN
20.0000 mg | Freq: Once | INTRAMUSCULAR | Status: AC
  Administered 2020-11-29: 20 mg via INTRAVENOUS

## 2020-11-29 MED ORDER — MIDAZOLAM BOLUS VIA INFUSION
0.0000 mg | INTRAVENOUS | Status: DC | PRN
  Administered 2020-11-29: 1 mg via INTRAVENOUS
  Filled 2020-11-29: qty 5

## 2020-11-29 MED ORDER — THIAMINE HCL 100 MG/ML IJ SOLN
100.0000 mg | Freq: Every day | INTRAMUSCULAR | Status: DC
  Administered 2020-11-29 – 2020-12-04 (×4): 100 mg via INTRAVENOUS
  Filled 2020-11-29 (×5): qty 2

## 2020-11-29 NOTE — Progress Notes (Signed)
eLink Physician-Brief Progress Note Patient Name: Ernest Rodgers DOB: 1970/08/26 MRN: [id number]   Date of Service  11/29/2020  HPI/Events of Note  Agitation - Nursing request for bilateral soft wrist restraints.   eICU Interventions  Plan: Will order bilateral soft wrist restraints X 12 hours.      Intervention Category Major Interventions: Delirium, psychosis, severe agitation - evaluation and management  Lenell Antu 11/29/2020, 9:33 PM

## 2020-11-29 NOTE — Progress Notes (Signed)
Pt transferred to 36m04 as ordered,accompanied by this writer, RT, Rapid response, and spouse. Report given to Cori,RN.

## 2020-11-29 NOTE — Progress Notes (Signed)
Patient transported to CT and back without complications.  

## 2020-11-29 NOTE — Significant Event (Addendum)
Rapid Response Event Note   Reason for Call :  Called for tachycardia, HR 130-145 bpm  Initial Focused Assessment:  Pt lying in bed. Skin is cool in bilateral lower extremities, diffuse mottling. Lung sounds are rhonchus throughout. Accessory muscle use, paradoxical breathing. Pt bites down when RN attempts to test gag reflex. Pt grimaces and withdrawals to painful stimuli in all four extremities. Eyes open intermittently to painful stimuli, although not sustained. PERRLA, 11mm.   VS: T 98.85F, BP 143/108, HR 141, RR 24, SPO2 85% on 100% NRB CBG: 114  Interventions:  -Maintenance IVF @ 152 cc/hr stopped upon my arrival -ABG 7.225/ 52.2/ 63/ 21.7 -CXR -Lasix 20mg  IV  -Lasix 40mg  IV  Plan of Care:  Transferred to ICU  Event Summary:  MD Notified: Dr. Call Time: 0715 Arrival Time: 0725 End Time: 0716  0726, RN

## 2020-11-29 NOTE — Progress Notes (Signed)
Pt seen by famifly medicne with rapid response,and RT  in room, Per family medicine pt possible transfer to ICU unit, until seen by CCM. Pt remains tachy in 130's, sat at 91% in non-rebreather mask, Pt sedated/barely arousable ,opens eyes.20mg  IV lasix given as ordered.

## 2020-11-29 NOTE — Progress Notes (Signed)
Pt seen in room during shift change report.Pt unresponsive. Pt's cardiac tele put it back on,and pt  tachycardic in 140's and hypoxic in the 60's. Rapid response paged by night shift  RN, and this Clinical research associate paged family medicine regarding pt's current status.

## 2020-11-29 NOTE — Procedures (Signed)
Intubation Procedure Note  Ernest Rodgers  [id number]  08/04/1970  Date:11/29/20  Time:9:40 AM   Provider Performing:Nuri Larmer    Procedure: Intubation (31500)  Indication(s) Respiratory Failure  Consent Risks of the procedure as well as the alternatives and risks of each were explained to the patient and/or caregiver.  Consent for the procedure was obtained and is signed in the bedside chart   Anesthesia Versed, Fentanyl, Rocuronium, and ketamine   Time Out Verified patient identification, verified procedure, site/side was marked, verified correct patient position, special equipment/implants available, medications/allergies/relevant history reviewed, required imaging and test results available.   Sterile Technique Usual hand hygeine, masks, and gloves were used   Procedure Description Patient positioned in bed supine.  Sedation given as noted above.  Patient was intubated with endotracheal tube using Glidescope.  View was Grade 1 full glottis .  Number of attempts was 1.  Colorimetric CO2 detector was consistent with tracheal placement.   Complications/Tolerance Hypotension treated with phenylephrine and fluids.  Chest X-ray is ordered to verify placement.  Lynnell Catalan, MD [facility] ICU Physician [facility] [place] [facility]  Pager: [phone] Or Epic Secure Chat After hours: [phone].  11/29/2020, 9:41 AM

## 2020-11-29 NOTE — Progress Notes (Signed)
Video connection link was texted to participant via mobile number provided. Connection was successful.  °

## 2020-11-29 NOTE — Progress Notes (Addendum)
FPTS Interim Progress Note - **Late Entry**  S: Team received page that pt was unresponsive. Upon arrival Rapid Response RN's at bedside.   O: BP (!) 151/106   Pulse (!) 106   Temp 98.5 F (36.9 C) (Axillary)   Resp (!) 24   Ht 5\' 10"  (1.778 m)   Wt 112 kg   SpO2 92%   BMI 35.44 kg/m    GEN:  lethargic, snoring with 15L NR RESP:  rales throughout, tachypneic  CVS: tachycardic, regular rhythm  SKIN: cool, mottled   A/P:  Acute Encephalopathy  EtOH withdrawal  Flash Pulmonary Edema  Pancreatitis Pt evaluated with rapid RN and Dr. . Pt responding to painful stimuli. Suspect his encephalopathy is due to his worsening alcohol withdrawal as he had acutely rising CIWA scores overnight. Pt was receiving increasing doses of Ativan. Pt has conflicting accounts  about how much he drank. EtOH level <10 on admission. Pt admitted for pancreatitis. Though was receiving fluids suspect flash pulmonary edema a complication of his pancreatitis and underlying diastolic dysfunction. There may be an element of hepatic congestion. ABG underway.   Consulted CCM. Melissa Noon, NP evaluated patient at beside and accepted admission to the ICU.  Wife at bedside.   Delorise Shiner, DO 11/29/2020, 8:46 AM PGY-3, [facility] Service pager [phone]

## 2020-11-29 NOTE — Progress Notes (Signed)
Initial Nutrition Assessment  DOCUMENTATION CODES:   Not applicable  INTERVENTION:   Obtain KUB to determine where OG side port is located prior to using for feeds.   Recommend Cortrak placement given feeding tube tip needs to terminate past gastric pouch to advance feeds past trickle rate.   Tube feeding:  -Vital 1.5 @ 20 ml/hr via OG -Goal rate: 50 ml/hr (1200 ml) -ProSource TF 45 ml QID  At goal rate TF provides: 1960 kcals, 125 grams protein, 917 ml free water.   Given history of gastric bypass check vitamin A, Vitamin D, Vitamin C, copper, and zinc  NUTRITION DIAGNOSIS:   Increased nutrient needs related to acute illness as evidenced by estimated needs.  GOAL:   Patient will meet greater than or equal to 90% of their needs  MONITOR:   Vent status, Skin, TF tolerance, I & O's, Labs, Weight trends  REASON FOR ASSESSMENT:   Ventilator, Consult Enteral/tube feeding initiation and management  ASSESSMENT:   Patient with PMH significant for HTN, CHF, s/p Roux en Y, and ETOH use. Presents this admission with acute pancreatitis and ETOH withdrawal.  8/18- found unresponsive on floor, intubated   CXR concerning for pulmonary edema and possible ARDS. Ongoing ETOH withdrawal. Noted history of gastric bypass. Need to obtain KUB to see where OG side port is located prior to feeding. Recommend Cortrak placement tomorrow given feeding tube tip will need to terminate past gastric pouch to advance feeds past trickle rate. Do not suspect patient will consume adequate kcal/protein if extubated in near future and could benefit from Cortrak either way.   Weight history limited but shows patient weighed 102 kg in 2018, 119.5 kg on 05/10/20, 117 kg on 10/01/20. Weight from this admission looks to be stated. Will need to obtain actual weight to assess for weight loss.   Drips: NS @ 50 ml/hr  Medications: colace, folic acid, miralax, thiamine  Labs: K 3.2 (L) LFTs trending  down  NUTRITION - FOCUSED PHYSICAL EXAM:  Flowsheet Row Most Recent Value  Orbital Region No depletion  Upper Arm Region Mild depletion  Thoracic and Lumbar Region Unable to assess  Buccal Region No depletion  Temple Region No depletion  Clavicle Bone Region Mild depletion  Clavicle and Acromion Bone Region Mild depletion  Scapular Bone Region Unable to assess  Dorsal Hand No depletion  Patellar Region No depletion  Anterior Thigh Region No depletion  Posterior Calf Region Unable to assess  Edema (RD Assessment) Moderate  Hair Reviewed  Eyes Unable to assess  Mouth Unable to assess  Skin Reviewed  Nails Unable to assess      Diet Order:   Diet Order             Diet NPO time specified  Diet effective now                   EDUCATION NEEDS:   Not appropriate for education at this time  Skin:  Skin Assessment: Reviewed RN Assessment  Last BM:  8/15  Height:   Ht Readings from Last 1 Encounters:  11/26/20 5\' 10"  (1.778 m)    Weight:   Wt Readings from Last 1 Encounters:  11/26/20 112 kg    BMI:  Body mass index is 35.44 kg/m.  Estimated Nutritional Needs:   Kcal:  1900-2100 kcal  Protein:  115-130 grams  Fluid:  >/= 2 L/day  11/28/20 MS, RD, LDN, CNSC Clinical Nutrition Pager listed in AMION

## 2020-11-29 NOTE — Progress Notes (Signed)
Pharmacy Electrolyte Replacement  Recent Labs:  Recent Labs    11/28/20 0250 11/29/20 0257 11/29/20 0746  K 4.3 3.4* 3.2*  MG 1.9  --   --   CREATININE 1.43* 0.85  --     No Critical values noted.   Plan:  - Supplement 40 mEq K PT x2  - supplement 2g Mg IV x1   Jani Gravel, PharmD PGY-1 Acute Care Resident  11/29/2020 9:54 AM

## 2020-11-29 NOTE — Progress Notes (Signed)
Rounded on patient again due to alcohol withdrawal. Pt was sound asleep. RN reports CIWAs have been 15 every hour for the last few hours and she has been receiving 2mg  Ativan. He is now becoming agitated every 2 hours rather than 1 hour. Continue CIWA protocol and administration of Ativan. RN to page if any further concerns overnight.   MD PGY-3, [facility]

## 2020-11-29 NOTE — Consult Note (Signed)
NAME:  Ernest Rodgers, MRN:  [id number], DOB:  October 25, 1970, LOS: 3 ADMISSION DATE:  11/26/2020, CONSULTATION DATE:  11/29/20 REFERRING MD:  Rachael Darby - FPTS, CHIEF COMPLAINT:  Unresponsive and Hypoxic    History of Present Illness:  50 yo M PMH HFpEF HTN EtOH use disorder prior cholecystectomy and Roux en Y presented to [facility] 8/15 with epigastric pain. On 8/13 fell into ball pit sustaining rib injury and was tx at urgent care with muscle relaxers and steroids. On 8/15 labs revealed lipase 1290.  CT c/a/p with findings consistent with pancreatitis, 3 broken ribs on L side.   Admitted to FPTS. Started on IVF and analgesic reg. Started on CIWA for etoh use disorder with sx on withdrawal increasing on 8/16, 8/17. On 8/18 patient was found unresponsive and hypoxic with SpO2 in 60s.  FPTS acquired CXR which had incr opacities, he was placed on supplemental O2, he was given 20mg  of lasix, with SpO2 improvement to 80s-90. Rapid response called  ABG acquired:7.22/52/63  CCM consulted in this setting    Pertinent  Medical History  Hx Roux en Y EtOH use disorder Prior tobacco use HFpEF HTN Hx cholecystectomy  Depression   Significant Hospital Events: Including procedures, antibiotic start and stop dates in addition to other pertinent events   8/15 admitted to [facility] for acute pancreatitis  8/16 EtOH withdrawal sx  8/17 incr CIWA scores  8/18 found to be unresponsive on floor and hypoxic. moving to ICU   Interim History / Subjective:  Found to be hypoxic and unresponsive earlier this shift.  Rapid response ultimately called and CCM consulted   Had incr CIWA scores overnight IVF have been decr and 20 lasix ordered   Objective   Blood pressure (!) 151/106, pulse (!) 106, temperature 98.5 F (36.9 C), temperature source Axillary, resp. rate (!) 24, height 5\' 10"  (1.778 m), weight 112 kg, SpO2 92 %.    FiO2 (%):  [100 %] 100 %   Intake/Output Summary (Last 24 hours) at 11/29/2020 0841 Last data  filed at 11/29/2020 0600 Gross per 24 hour  Intake 3964.14 ml  Output 0 ml  Net 3964.14 ml   Filed Weights   11/26/20 1516  Weight: 112 kg    Examination: General: chronically and acutely ill appearing middle aged M reclined in bed moderate distress HENT: NCAT dry mm. Trachea midline. Icteric sclera  Lungs: Diffuse rhonchi, crackles . R sided wheeze  Cardiovascular: tachycardic, regular s1s2 cap refill brisk  Abdomen: Round soft. Generalized tenderness  Extremities: no acute joint deformity no cyanosis or clubbing  Neuro: Awakens to voice. Moving BUE BLE spontaneously. Can open mouth stick out tongue. Pupils are briskly reactive. L pupil is 3.38mm and R pupil is 79mm  GU: wnl. Making amber urine  Skin: Ruddy appearance. Facial telangiectasia. C/d/w   Resolved Hospital Problem list   AKI   Assessment & Plan:   Acute metabolic encephalopathy  -EtOH withdrawal, Ativan administration and analgesic management for pancreatitis Anisocoria  - suspect this is due to active metabolic processes, cant r/o intracranial process  P -cont CIWA  -if intubated will send for CT H  -add on ammonia   Acute hypoxic respiratory failure Prior tobacco use disorder  -suspect budding ARDS in setting of pancreatitis, component of pulmonary edema, possible aspiration  P -transfer to ICU urgently -HHFNC - add'l 40mg  IV lasix now (got 20 by FPTS)  -very low threshold for intubation, certainly not a good candidate for trial of bipap and at that  point would proceed with ETT  -albuterol neb now, PRN duoneb  Pancreatitis  -presumed due to EtOH (admission notes with daily etoh use but historically no great hx of etoh patterns noted)  -prior cholecystectomy  P -cont IVF -- scaling back temporarily on 8/18 AM with acute respiratory decline (trying diuresis for acute temporization)  -PRN analgesic management -if intubated, will start EN (no need for NPO for pancreatitis) -if respiratory status stabilizes  without advanced airway, would place order for cortrak (needs to be post pyloric) and start EN when able   Alcohol use disorder with withdrawal -2-7 cocktails/night (2 if work night 6-7 if not work night)  P -CIWA -if requires intubation, will either cont a SCH BZD or put on prop. Cannot be without gaba coverage of some sort   Hepatic steatosis ? NASH  Elevated LFTs Hyperbilirubinemia -has been seen OP P -PRN LFTs, coags   HFpEF HTN  P -ICU monitoring  - On metop currently, will hold until decision made RE airway. If gets intubated will see how hemodynamics respond to sedation   Hypokalemia P -replace -trend BMP   Acute non-displaced L rib fractures P -PRN analgesia   Depression -fluoxetine   Best Practice (right click and "Reselect all SmartList Selections" daily)   Diet/type: NPO DVT prophylaxis: LMWH GI prophylaxis: PPI Lines: N/A Foley:  N/A Code Status:  full code Last date of multidisciplinary goals of care discussion [wife updated 8/18 at bedside by CCM and FPTS prior to transfer, again in ICU by CCM]  Labs   CBC: Recent Labs  Lab 11/26/20 0907 11/27/20 0208 11/29/20 0257 11/29/20 0746  WBC 7.5 5.0 5.4  --   NEUTROABS 6.0  --  3.9  --   HGB 14.6 15.7 12.0* 14.3  HCT 40.8 45.7 34.4* 42.0  MCV 104.6* 109.9* 106.8*  --   PLT 160 128* 82*  --     Basic Metabolic Panel: Recent Labs  Lab 11/26/20 0907 11/26/20 1934 11/27/20 0208 11/28/20 0250 11/29/20 0257 11/29/20 0746  NA 139  --  141 138 138 139  K 3.1*  --  4.1 4.3 3.4* 3.2*  CL 100  --  104 101 102  --   CO2 20*  --  22 25 17*  --   GLUCOSE 103*  --  172* 122* 91  --   BUN 11  --  17 22* 13  --   CREATININE 0.79  --  0.93 1.43* 0.85  --   CALCIUM 9.8  --  9.0 8.6* 8.6*  --   MG  --  1.1*  --  1.9  --   --    GFR: Estimated Creatinine Clearance: 128.8 mL/min (by C-G formula based on SCr of 0.85 mg/dL). Recent Labs  Lab 11/26/20 0907 11/26/20 1934 11/26/20 2231 11/27/20 0208  11/29/20 0257  WBC 7.5  --   --  5.0 5.4  LATICACIDVEN  --  2.3* 1.7  --   --     Liver Function Tests: Recent Labs  Lab 11/26/20 0907 11/27/20 0208 11/28/20 0250 11/29/20 0257  AST 105* 873* 682* 383*  ALT 87* 208* 434* 319*  ALKPHOS 67 81 90 95  BILITOT 2.0* 6.5* 8.1* 7.7*  PROT 7.6 6.4* 5.8* 5.1*  ALBUMIN 4.3 3.6 3.0* 2.5*   Recent Labs  Lab 11/26/20 0907 11/27/20 0208  LIPASE 1,290* 507*   No results for input(s): AMMONIA in the last 168 hours.  ABG    Component Value Date/Time  PHART 7.225 (L) 11/29/2020 0746   PCO2ART 52.2 (H) 11/29/2020 0746   PO2ART 63 (L) 11/29/2020 0746   HCO3 21.7 11/29/2020 0746   TCO2 23 11/29/2020 0746   ACIDBASEDEF 6.0 (H) 11/29/2020 0746   O2SAT 87.0 11/29/2020 0746     Coagulation Profile: No results for input(s): INR, PROTIME in the last 168 hours.  Cardiac Enzymes: Recent Labs  Lab 11/28/20 0250  CKTOTAL 181    HbA1C: No results found for: HGBA1C  CBG: Recent Labs  Lab 11/29/20 0812  GLUCAP 114*    Review of Systems:   Unable to obtain due to encephalopathy   Past Medical History:  He,  has a past medical history of Hypertension and Kidney stone.   Surgical History:   Past Surgical History:  Procedure Laterality Date   GASTRECTOMY       Social History:   reports that he has never smoked. He has never used smokeless tobacco.   Family History:  His family history is not on file.   Allergies No Known Allergies   Home Medications  Prior to Admission medications   Medication Sig Start Date End Date Taking? Authorizing Provider  cyclobenzaprine (FLEXERIL) 10 MG tablet Take 1 tablet (10 mg total) by mouth 2 (two) times daily as needed for muscle spasms. Patient taking differently: Take 10 mg by mouth every 12 (twelve) hours. 11/24/20  Yes Wurst, [place], PA-C  FLUoxetine (PROZAC) 20 MG tablet Take 20 mg by mouth every morning. 11/17/20  Yes [provider]  furosemide (LASIX) 20 MG tablet Take  20 mg by mouth every morning. 10/01/20  Yes [provider]  lisinopril (ZESTRIL) 10 MG tablet Take 10 mg by mouth daily. 03/30/20  Yes [provider]  metoprolol succinate (TOPROL-XL) 50 MG 24 hr tablet Take 50 mg by mouth daily. 08/25/20  Yes [provider]  omeprazole (PRILOSEC) 20 MG capsule Take 20 mg by mouth daily. 11/20/20  Yes [provider]  ondansetron (ZOFRAN) 8 MG tablet Take 8 mg by mouth every 8 (eight) hours as needed for nausea or vomiting. 07/14/20  Yes [provider]  predniSONE (STERAPRED UNI-PAK 21 TAB) 10 MG (21) TBPK tablet Take by mouth daily. Take 6 tabs by mouth daily  for 2 days, then 5 tabs for 2 days, then 4 tabs for 2 days, then 3 tabs for 2 days, 2 tabs for 2 days, then 1 tab by mouth daily for 2 days 11/24/20  Yes Wurst, [place], PA-C     Critical care time: 60 min     CRITICAL CARE Performed by: Lanier Clam   Total critical care time: 60 minutes  Critical care time was exclusive of separately billable procedures and treating other patients. Critical care was necessary to treat or prevent imminent or life-threatening deterioration.  Critical care was time spent personally by me on the following activities: development of treatment plan with patient and/or surrogate as well as nursing, discussions with consultants, evaluation of patient's response to treatment, examination of patient, obtaining history from patient or surrogate, ordering and performing treatments and interventions, ordering and review of laboratory studies, ordering and review of radiographic studies, pulse oximetry and re-evaluation of patient's condition.  Tessie Fass MSN, AGACNP-BC [facility] Pulmonary/Critical Care Medicine Amion for pager  11/29/2020, 9:29 AM

## 2020-11-30 DIAGNOSIS — R0603 Acute respiratory distress: Secondary | ICD-10-CM | POA: Diagnosis not present

## 2020-11-30 DIAGNOSIS — N179 Acute kidney failure, unspecified: Secondary | ICD-10-CM

## 2020-11-30 DIAGNOSIS — K859 Acute pancreatitis without necrosis or infection, unspecified: Secondary | ICD-10-CM | POA: Diagnosis not present

## 2020-11-30 DIAGNOSIS — F10230 Alcohol dependence with withdrawal, uncomplicated: Secondary | ICD-10-CM

## 2020-11-30 LAB — CBC
HCT: 35 % — ABNORMAL LOW (ref 39.0–52.0)
Hemoglobin: 11.7 g/dL — ABNORMAL LOW (ref 13.0–17.0)
MCH: 36.7 pg — ABNORMAL HIGH (ref 26.0–34.0)
MCHC: 33.4 g/dL (ref 30.0–36.0)
MCV: 109.7 fL — ABNORMAL HIGH (ref 80.0–100.0)
Platelets: 100 10*3/uL — ABNORMAL LOW (ref 150–400)
RBC: 3.19 MIL/uL — ABNORMAL LOW (ref 4.22–5.81)
RDW: 15.7 % — ABNORMAL HIGH (ref 11.5–15.5)
WBC: 5 10*3/uL (ref 4.0–10.5)
nRBC: 1.2 % — ABNORMAL HIGH (ref 0.0–0.2)

## 2020-11-30 LAB — PHOSPHORUS
Phosphorus: 2.8 mg/dL (ref 2.5–4.6)
Phosphorus: 2.5 mg/dL (ref 2.5–4.6)

## 2020-11-30 LAB — COMPREHENSIVE METABOLIC PANEL
ALT: 227 U/L — ABNORMAL HIGH (ref 0–44)
AST: 96 U/L — ABNORMAL HIGH (ref 15–41)
Albumin: 2.4 g/dL — ABNORMAL LOW (ref 3.5–5.0)
Alkaline Phosphatase: 107 U/L (ref 38–126)
Anion gap: 10 (ref 5–15)
BUN: 29 mg/dL — ABNORMAL HIGH (ref 6–20)
CO2: 24 mmol/L (ref 22–32)
Calcium: 8.5 mg/dL — ABNORMAL LOW (ref 8.9–10.3)
Chloride: 108 mmol/L (ref 98–111)
Creatinine, Ser: 1.17 mg/dL (ref 0.61–1.24)
GFR, Estimated: >60 mL/min (ref >60)
Glucose, Bld: 172 mg/dL — ABNORMAL HIGH (ref 70–99)
Potassium: 3.8 mmol/L (ref 3.5–5.1)
Sodium: 142 mmol/L (ref 135–145)
Total Bilirubin: 2.6 mg/dL — ABNORMAL HIGH (ref 0.3–1.2)
Total Protein: 5.5 g/dL — ABNORMAL LOW (ref 6.5–8.1)

## 2020-11-30 LAB — PROTIME-INR
INR: 1.2 (ref 0.8–1.2)
Prothrombin Time: 15.2 seconds (ref 11.4–15.2)

## 2020-11-30 LAB — AMMONIA: Ammonia: 82 umol/L — ABNORMAL HIGH (ref 9–35)

## 2020-11-30 LAB — MAGNESIUM
Magnesium: 2.1 mg/dL (ref 1.7–2.4)
Magnesium: 1.9 mg/dL (ref 1.7–2.4)

## 2020-11-30 LAB — GLUCOSE, CAPILLARY
Glucose-Capillary: 187 mg/dL — ABNORMAL HIGH (ref 70–99)
Glucose-Capillary: 169 mg/dL — ABNORMAL HIGH (ref 70–99)
Glucose-Capillary: 172 mg/dL — ABNORMAL HIGH (ref 70–99)
Glucose-Capillary: 165 mg/dL — ABNORMAL HIGH (ref 70–99)
Glucose-Capillary: 168 mg/dL — ABNORMAL HIGH (ref 70–99)
Glucose-Capillary: 182 mg/dL — ABNORMAL HIGH (ref 70–99)

## 2020-11-30 LAB — COPPER, SERUM: Copper: 71 ug/dL (ref 69–132)

## 2020-11-30 LAB — FOLATE: Folate: 11.4 ng/mL (ref >5.9)

## 2020-11-30 LAB — ZINC: Zinc: 42 ug/dL — ABNORMAL LOW (ref 44–115)

## 2020-11-30 LAB — VITAMIN B12: Vitamin B-12: 3197 pg/mL — ABNORMAL HIGH (ref 180–914)

## 2020-11-30 MED ORDER — ACETAMINOPHEN 325 MG PO TABS
650.0000 mg | ORAL_TABLET | Freq: Four times a day (QID) | ORAL | Status: DC | PRN
  Administered 2020-11-30 – 2020-12-01 (×3): 650 mg
  Filled 2020-11-30 (×3): qty 2

## 2020-11-30 MED ORDER — CHLORDIAZEPOXIDE HCL 25 MG PO CAPS
25.0000 mg | ORAL_CAPSULE | Freq: Four times a day (QID) | ORAL | Status: AC
  Administered 2020-11-30 (×4): 25 mg
  Filled 2020-11-30 (×4): qty 1

## 2020-11-30 MED ORDER — CHLORDIAZEPOXIDE HCL 25 MG PO CAPS: 25.0000 mg | ORAL_CAPSULE | Freq: Every day | ORAL | Status: DC

## 2020-11-30 MED ORDER — HYDROXYZINE HCL 25 MG PO TABS: 25.0000 mg | ORAL_TABLET | Freq: Four times a day (QID) | ORAL | Status: DC | PRN

## 2020-11-30 MED ORDER — CHLORDIAZEPOXIDE HCL 25 MG PO CAPS
25.0000 mg | ORAL_CAPSULE | Freq: Four times a day (QID) | ORAL | Status: DC | PRN
Start: 2020-11-30 — End: 2020-12-01

## 2020-11-30 MED ORDER — POTASSIUM CHLORIDE 20 MEQ PO PACK
40.0000 meq | PACK | Freq: Once | ORAL | Status: AC
  Administered 2020-11-30: 40 meq
  Filled 2020-11-30: qty 2

## 2020-11-30 MED ORDER — STERILE WATER FOR INJECTION IJ SOLN
INTRAMUSCULAR | Status: AC
  Administered 2020-11-30: 10 mL
  Filled 2020-11-30: qty 10

## 2020-11-30 MED ORDER — ONDANSETRON 4 MG PO TBDP: 4.0000 mg | ORAL_TABLET | Freq: Four times a day (QID) | ORAL | Status: DC | PRN

## 2020-11-30 MED ORDER — CHLORDIAZEPOXIDE HCL 25 MG PO CAPS
25.0000 mg | ORAL_CAPSULE | Freq: Three times a day (TID) | ORAL | Status: DC
  Administered 2020-12-01: 25 mg
  Filled 2020-11-30: qty 1

## 2020-11-30 MED ORDER — LACTATED RINGERS IV SOLN: INTRAVENOUS | Status: DC

## 2020-11-30 MED ORDER — SODIUM CHLORIDE 0.9 % IV SOLN
3.0000 g | Freq: Four times a day (QID) | INTRAVENOUS | Status: DC
  Administered 2020-11-30 – 2020-12-02 (×9): 3 g via INTRAVENOUS
  Filled 2020-11-30 (×9): qty 8

## 2020-11-30 MED ORDER — VITAL 1.5 CAL PO LIQD
1000.0000 mL | ORAL | Status: DC
  Administered 2020-11-30: 1000 mL

## 2020-11-30 MED ORDER — CHLORDIAZEPOXIDE HCL 25 MG PO CAPS: 25.0000 mg | ORAL_CAPSULE | ORAL | Status: DC

## 2020-11-30 MED ORDER — LOPERAMIDE HCL 2 MG PO CAPS: 2.0000 mg | ORAL_CAPSULE | ORAL | Status: DC | PRN

## 2020-11-30 NOTE — Progress Notes (Signed)
Assisted tele visit to patient with family member.  Selin Eisler M, RN   

## 2020-11-30 NOTE — Progress Notes (Signed)
NAME:  Ernest Rodgers, MRN:  [id number], DOB:  [birth year], LOS: 4 ADMISSION DATE:  11/26/2020, CONSULTATION DATE:  11/29/20 REFERRING MD:  Rachael Darby - FPTS, CHIEF COMPLAINT:  Unresponsive and Hypoxic    History of Present Illness:  50 yo M PMH HFpEF HTN EtOH use disorder prior cholecystectomy and Roux en Y presented to [facility] 8/15 with epigastric pain. On 8/13 fell into ball pit sustaining rib injury and was tx at urgent care with muscle relaxers and steroids. On 8/15 labs revealed lipase 1290.  CT c/a/p with findings consistent with pancreatitis, 3 broken ribs on L side.   Admitted to FPTS. Started on IVF and analgesic reg. Started on CIWA for etoh use disorder with sx on withdrawal increasing on 8/16, 8/17. On 8/18 patient was found unresponsive and hypoxic with SpO2 in 60s.  FPTS acquired CXR which had incr opacities, he was placed on supplemental O2, he was given 20mg  of lasix, with SpO2 improvement to 80s-90. Rapid response called.  Pertinent  Medical History  Hx Roux en Y EtOH use disorder Prior tobacco use HFpEF HTN Hx cholecystectomy  Depression   Significant Hospital Events: Including procedures, antibiotic start and stop dates in addition to other pertinent events   8/15 admitted to [facility] for acute pancreatitis  8/16 EtOH withdrawal sx  8/17 incr CIWA scores  8/18 found to be unresponsive on floor and hypoxic. moving to ICU, intubated   Interim History / Subjective:  No O/N Events:   Spoke with wife at bedside this AM, patient appears to be combative when weans are attempted.   Objective   Blood pressure 120/76, pulse 83, temperature 98.5 F (36.9 C), temperature source Oral, resp. rate (!) 24, height 5\' 10"  (1.778 m), weight 117.6 kg, SpO2 99 %.    Vent Mode: PRVC FiO2 (%):  [50 %-100 %] 50 % Set Rate:  [22 bmp] 22 bmp Vt Set:  [580 mL-5880 mL] 580 mL PEEP:  [8 cmH20] 8 cmH20 Plateau Pressure:  [20 cmH20-21 cmH20] 20 cmH20   Intake/Output Summary (Last 24 hours) at  11/30/2020 0749 Last data filed at 11/30/2020 0500 Gross per 24 hour  Intake 1985.46 ml  Output 1870 ml  Net 115.46 ml   Filed Weights   11/26/20 1516 11/30/20 0426  Weight: 112 kg 117.6 kg    Examination: Physical Exam Constitutional:      Appearance: He is obese. He is ill-appearing.     Comments: Intubated, non communicative, ill appearing male  Cardiovascular:     Rate and Rhythm: Normal rate and regular rhythm.     Pulses: Normal pulses.     Heart sounds: Normal heart sounds. No murmur heard.   No friction rub. No gallop.  Pulmonary:     Breath sounds: Rhonchi present.     Comments: Intubated, rhonchi appreciated in bilateral lung fields. Thick green sputum on suction.  Abdominal:     General: Abdomen is flat. Bowel sounds are normal. There is no distension.     Tenderness: There is no abdominal tenderness. There is no guarding.  Musculoskeletal:     Right lower leg: No edema.     Left lower leg: No edema.     Resolved Hospital Problem list   AKI   Assessment & Plan:   50 y/o M with a history of EtOH use, HTN, tobacco use, admitted for pancreatitis, and PCCM consulted for worsening hypoxia in the setting of acute metabolic encephalopathy in the Setting of EtOH Withdrawal, Analgesia for pancreatitis, and  likely aspiration.    Acute Hypoxic Respiratory Failure Requiring MV Aspiration Pneumonia:  Intubated 8/18 for airway protection in the setting of encephalopathy/hypoxia. Likely had an aspiration event, suction this AM has productive thick green mucus.  - Start Unasyn per pharmacy - PRN Duoneb - Trend CBC and fever curves - VAP protocol  - Continue daily SBT/SAT as tolerated.   Pancreatitis Likely 2/2 to EtOH:  Hx of prior cholecystectomy.   - PRN analgesic management (Fentanyl)  - Increase Tube Feeds as tolerated - Continue IV LR 75 ml/hr  Alcohol Withdrawal:  Was started on Ativan 0.5 mg TID, will start on Librium taper today.   - CIWA  - Start Librium  taper - Continue Thiamine  - Continue Folic acid   Hepatic Steatosis ? NASH  Elevated LFTs Hyperbilirubinemia:  Seen in the outpatient setting, collecting coag studies to assess Maddrey's Discriminant function for indication of steroids.  - Trend LFTs - PT-INR  Macrocytic Anemia:  Likely 2/2 to EtOH use, will rule out megaloblastic anemia - Folate level - B-12 level - Trend CBC  Anisocoria:  CT head negative for acute changes, continue to monitor.  - Continue to monitor   HFpEF HTN: EF 60-65%. On metoprolol at home, pressures normortensive ON.  - Continue to monitor vitals.  - Continue Metoprolol 25 mg BID  Acute Non-Displaced L Rib Fractures: -PRN Fentanyl    Depression - Fluoxetine 20 mg QD  Best Practice (right click and "Reselect all SmartList Selections" daily)   Diet/type: NPO DVT prophylaxis: LMWH GI prophylaxis: PPI Lines: N/A Foley:  N/A Code Status:  full code Last date of multidisciplinary goals of care discussion   Labs   CBC: Recent Labs  Lab 11/26/20 0907 11/27/20 0208 11/29/20 0257 11/29/20 0746 11/29/20 1019 11/30/20 0536  WBC 7.5 5.0 5.4  --   --  5.0  NEUTROABS 6.0  --  3.9  --   --   --   HGB 14.6 15.7 12.0* 14.3 12.2* 11.7*  HCT 40.8 45.7 34.4* 42.0 36.0* 35.0*  MCV 104.6* 109.9* 106.8*  --   --  109.7*  PLT 160 128* 82*  --   --  100*    Basic Metabolic Panel: Recent Labs  Lab 11/26/20 1934 11/27/20 0208 11/28/20 0250 11/29/20 0257 11/29/20 0746 11/29/20 1019 11/29/20 1358 11/29/20 1814 11/30/20 0536  NA  --  141 138 138 139 140 140  --  142  K  --  4.1 4.3 3.4* 3.2* 3.1* 3.7  --  3.8  CL  --  104 101 102  --   --  104  --  108  CO2  --  22 25 17*  --   --  20*  --  24  GLUCOSE  --  172* 122* 91  --   --  136*  --  172*  BUN  --  17 22* 13  --   --  17  --  29*  CREATININE  --  0.93 1.43* 0.85  --   --  1.09  --  1.17  CALCIUM  --  9.0 8.6* 8.6*  --   --  8.3*  --  8.5*  MG 1.1*  --  1.9  --   --   --  2.1 2.4 2.1   PHOS  --   --   --   --   --   --  3.5 3.5 2.8   GFR: Estimated Creatinine Clearance: 95.9 mL/min (by C-G  formula based on SCr of 1.17 mg/dL). Recent Labs  Lab 11/26/20 0907 11/26/20 1934 11/26/20 2231 11/27/20 0208 11/29/20 0257 11/30/20 0536  WBC 7.5  --   --  5.0 5.4 5.0  LATICACIDVEN  --  2.3* 1.7  --   --   --     Liver Function Tests: Recent Labs  Lab 11/26/20 0907 11/27/20 0208 11/28/20 0250 11/29/20 0257 11/30/20 0536  AST 105* 873* 682* 383* 96*  ALT 87* 208* 434* 319* 227*  ALKPHOS 67 81 90 95 107  BILITOT 2.0* 6.5* 8.1* 7.7* 2.6*  PROT 7.6 6.4* 5.8* 5.1* 5.5*  ALBUMIN 4.3 3.6 3.0* 2.5* 2.4*   Recent Labs  Lab 11/26/20 0907 11/27/20 0208  LIPASE 1,290* 507*   Recent Labs  Lab 11/29/20 1050 11/30/20 0536  AMMONIA 75* 82*    ABG    Component Value Date/Time   PHART 7.345 (L) 11/29/2020 1019   PCO2ART 41.3 11/29/2020 1019   PO2ART 105 11/29/2020 1019   HCO3 22.5 11/29/2020 1019   TCO2 24 11/29/2020 1019   ACIDBASEDEF 3.0 (H) 11/29/2020 1019   O2SAT 98.0 11/29/2020 1019     Coagulation Profile: No results for input(s): INR, PROTIME in the last 168 hours.  Cardiac Enzymes: Recent Labs  Lab 11/28/20 0250  CKTOTAL 181    HbA1C: No results found for: HGBA1C  CBG: Recent Labs  Lab 11/29/20 1614 11/29/20 1936 11/29/20 2314 11/30/20 0316 11/30/20 0708  GLUCAP 132* 133* 158* 187* 169*    Review of Systems:   Unable to obtain due to encephalopathy   Past Medical History:  He,  has a past medical history of Hypertension and Kidney stone.   Surgical History:   Past Surgical History:  Procedure Laterality Date   GASTRECTOMY       Social History:   reports that he has never smoked. He has never used smokeless tobacco.   Family History:  His family history is not on file.   Allergies No Known Allergies   Home Medications  Prior to Admission medications   Medication Sig Start Date End Date Taking? Authorizing Provider   cyclobenzaprine (FLEXERIL) 10 MG tablet Take 1 tablet (10 mg total) by mouth 2 (two) times daily as needed for muscle spasms. Patient taking differently: Take 10 mg by mouth every 12 (twelve) hours. 11/24/20  Yes Wurst, [place], PA-C  FLUoxetine (PROZAC) 20 MG tablet Take 20 mg by mouth every morning. 11/17/20  Yes [provider]  furosemide (LASIX) 20 MG tablet Take 20 mg by mouth every morning. 10/01/20  Yes [provider]  lisinopril (ZESTRIL) 10 MG tablet Take 10 mg by mouth daily. 03/30/20  Yes [provider]  metoprolol succinate (TOPROL-XL) 50 MG 24 hr tablet Take 50 mg by mouth daily. 08/25/20  Yes [provider]  omeprazole (PRILOSEC) 20 MG capsule Take 20 mg by mouth daily. 11/20/20  Yes [provider]  ondansetron (ZOFRAN) 8 MG tablet Take 8 mg by mouth every 8 (eight) hours as needed for nausea or vomiting. 07/14/20  Yes [provider]  predniSONE (STERAPRED UNI-PAK 21 TAB) 10 MG (21) TBPK tablet Take by mouth daily. Take 6 tabs by mouth daily  for 2 days, then 5 tabs for 2 days, then 4 tabs for 2 days, then 3 tabs for 2 days, 2 tabs for 2 days, then 1 tab by mouth daily for 2 days 11/24/20  Yes Wurst, [place], [place]     Dolan Amen, MD IMTS, PGY-3  Pager: [phone] 11/30/2020,7:49 AM

## 2020-11-30 NOTE — Progress Notes (Signed)
Pharmacy Antibiotic Note  Ernest Rodgers is a 50 y.o. male admitted on 11/26/2020 with acute pancreatitis and now concern for aspiration PNA. Pharmacy has been consulted for Unasyn dosing.  Plan: - Start Unasyn 3g IV every 6 hours - Will continue to follow renal function, culture results, LOT, and antibiotic de-escalation plans   Height: 5\' 10"  (177.8 cm) Weight: 117.6 kg (259 lb 4.2 oz) IBW/kg (Calculated) : 73  Temp (24hrs), Avg:99 F (37.2 C), Min:98.3 F (36.8 C), Max:100.1 F (37.8 C)  Recent Labs  Lab 11/26/20 0907 11/26/20 1934 11/26/20 2231 11/27/20 0208 11/28/20 0250 11/29/20 0257 11/29/20 1358 11/30/20 0536  WBC 7.5  --   --  5.0  --  5.4  --  5.0  CREATININE 0.79  --   --  0.93 1.43* 0.85 1.09 1.17  LATICACIDVEN  --  2.3* 1.7  --   --   --   --   --     Estimated Creatinine Clearance: 95.9 mL/min (by C-G formula based on SCr of 1.17 mg/dL).    No Known Allergies  Antimicrobials this admission: Unasyn 8/19 >>  Dose adjustments this admission: N/a  Microbiology results: 8/15 COVID >> neg 8/18 MRSA PCR >> neg 8/19 RCx (TA) >>  Thank you for allowing pharmacy to be a part of this patient's care.  9/19, PharmD, BCPS Clinical Pharmacist Clinical phone for 11/30/2020: 12/02/2020 11/30/2020 10:56 AM   **Pharmacist phone directory can now be found on [email or web address] ([email or web address]).  Listed under [facility].

## 2020-11-30 NOTE — Progress Notes (Signed)
eLink Physician-Brief Progress Note Patient Name: Ernest Rodgers DOB: 1970-09-19 MRN: [id number]   Date of Service  11/30/2020  HPI/Events of Note  Agitation - Request to renew restraint order. No current restraint order exists.   eICU Interventions  Will order bilateral soft wrist restraints X 11 hours.      Intervention Category Major Interventions: Delirium, psychosis, severe agitation - evaluation and management  Autie Vasudevan Eugene 11/30/2020, 10:38 PM

## 2020-11-30 NOTE — Progress Notes (Signed)
Nutrition Follow-up  DOCUMENTATION CODES:   Not applicable  INTERVENTION:   Continue tube feeds via OG tube: - Advance Vital 1.5 by 10 ml q 4 hours to goal rate of 50 ml/hr (1200 ml/day) - ProSource TF 45 ml QID  Tube feeding regimen at goal provides 1960 kcal, 125 grams of protein, and 917 ml of H2O.   - Vitamin and mineral labs pending  NUTRITION DIAGNOSIS:   Increased nutrient needs related to acute illness as evidenced by estimated needs.  Ongoing, being addressed via TF  GOAL:   Patient will meet greater than or equal to 90% of their needs  Met via TF at goal  MONITOR:   Vent status, Skin, TF tolerance, I & O's, Labs, Weight trends  REASON FOR ASSESSMENT:   Ventilator, Consult Enteral/tube feeding initiation and management  ASSESSMENT:   Patient with PMH significant for HTN, CHF, s/p Roux en Y, and ETOH use. Presents this admission with acute pancreatitis and ETOH withdrawal.  8/16 - clear liquid diet 8/17 - regular diet (PO 25-50%) 8/18 - found unresponsive on floor, intubated, trickle TF initiated  CXR concerning for pulmonary edema and possible ARDS. Ongoing ETOH withdrawal.  Discussed pt with RN and MD. MD okay with advancing tube feeds to goal via OG tube. Per abdominal x-ray yesterday, OG tube appears to extend past gastric pouch and into small bowel.  Admit weight: 112 kg Current weight: 117.6 kg  Pt with moderate pitting edema to BLE and non-pitting edema to BUE.  Patient is currently intubated on ventilator support MV: 12.3 L/min Temp (24hrs), Avg:99 F (37.2 C), Min:98.3 F (36.8 C), Max:100.1 F (37.8 C)  Drips: Precedex Fentanyl LR: 75 ml/hr  Medications reviewed and include: colace, folic acid, lactulose, MVI with minerals daily, protonix, miralax, thiamine  Vitamin/Mineral Profile:  Vitamin A: pending Vitamin D: 33.01 (WNL) Vitamin C: pending Copper: pending Zinc: pending  Labs reviewed: BUN 29, elevated LFTs (trending  down) CBG's: 132-187 x 24 hours  UOP: 1870 ml x 24 hours I/O's: +5.1 L since admit  Diet Order:   Diet Order             Diet NPO time specified  Diet effective now                   EDUCATION NEEDS:   Not appropriate for education at this time  Skin:  Skin Assessment: Reviewed RN Assessment  Last BM:  11/26/20  Height:   Ht Readings from Last 1 Encounters:  11/26/20 _0  (1.778 m)    Weight:   Wt Readings from Last 1 Encounters:  11/30/20 117.6 kg    BMI:  Body mass index is 37.2 kg/m.  Estimated Nutritional Needs:   Kcal:  1900-2100 kcal  Protein:  115-130 grams  Fluid:  >/= 2 L/day    Gustavus Bryant, MS, RD, LDN Inpatient Clinical Dietitian Please see AMiON for contact information.

## 2020-12-01 ENCOUNTER — Inpatient Hospital Stay (HOSPITAL_COMMUNITY): Payer: Managed Care, Other (non HMO)

## 2020-12-01 DIAGNOSIS — K852 Alcohol induced acute pancreatitis without necrosis or infection: Secondary | ICD-10-CM | POA: Diagnosis not present

## 2020-12-01 LAB — GLUCOSE, CAPILLARY
Glucose-Capillary: 178 mg/dL — ABNORMAL HIGH (ref 70–99)
Glucose-Capillary: 176 mg/dL — ABNORMAL HIGH (ref 70–99)
Glucose-Capillary: 185 mg/dL — ABNORMAL HIGH (ref 70–99)
Glucose-Capillary: 144 mg/dL — ABNORMAL HIGH (ref 70–99)
Glucose-Capillary: 138 mg/dL — ABNORMAL HIGH (ref 70–99)
Glucose-Capillary: 159 mg/dL — ABNORMAL HIGH (ref 70–99)

## 2020-12-01 LAB — COMPREHENSIVE METABOLIC PANEL
ALT: 160 U/L — ABNORMAL HIGH (ref 0–44)
AST: 47 U/L — ABNORMAL HIGH (ref 15–41)
Albumin: 2.3 g/dL — ABNORMAL LOW (ref 3.5–5.0)
Alkaline Phosphatase: 102 U/L (ref 38–126)
Anion gap: 9 (ref 5–15)
BUN: 23 mg/dL — ABNORMAL HIGH (ref 6–20)
CO2: 24 mmol/L (ref 22–32)
Calcium: 8.8 mg/dL — ABNORMAL LOW (ref 8.9–10.3)
Chloride: 113 mmol/L — ABNORMAL HIGH (ref 98–111)
Creatinine, Ser: 0.72 mg/dL (ref 0.61–1.24)
GFR, Estimated: >60 mL/min (ref >60)
Glucose, Bld: 182 mg/dL — ABNORMAL HIGH (ref 70–99)
Potassium: 3.8 mmol/L (ref 3.5–5.1)
Sodium: 146 mmol/L — ABNORMAL HIGH (ref 135–145)
Total Bilirubin: 2 mg/dL — ABNORMAL HIGH (ref 0.3–1.2)
Total Protein: 5.6 g/dL — ABNORMAL LOW (ref 6.5–8.1)

## 2020-12-01 LAB — CBC
HCT: 37.1 % — ABNORMAL LOW (ref 39.0–52.0)
Hemoglobin: 12.5 g/dL — ABNORMAL LOW (ref 13.0–17.0)
MCH: 37.5 pg — ABNORMAL HIGH (ref 26.0–34.0)
MCHC: 33.7 g/dL (ref 30.0–36.0)
MCV: 111.4 fL — ABNORMAL HIGH (ref 80.0–100.0)
Platelets: 109 10*3/uL — ABNORMAL LOW (ref 150–400)
RBC: 3.33 MIL/uL — ABNORMAL LOW (ref 4.22–5.81)
RDW: 16.1 % — ABNORMAL HIGH (ref 11.5–15.5)
WBC: 6.1 10*3/uL (ref 4.0–10.5)
nRBC: 0.3 % — ABNORMAL HIGH (ref 0.0–0.2)

## 2020-12-01 LAB — PROTIME-INR
INR: 1.2 (ref 0.8–1.2)
Prothrombin Time: 15.1 seconds (ref 11.4–15.2)

## 2020-12-01 LAB — VITAMIN C: Vitamin C: 0.2 mg/dL — ABNORMAL LOW (ref 0.4–2.0)

## 2020-12-01 MED ORDER — LORAZEPAM 2 MG/ML IJ SOLN: 1.0000 mg | INTRAMUSCULAR | Status: DC | PRN

## 2020-12-01 MED ORDER — VANCOMYCIN HCL 2000 MG/400ML IV SOLN
2000.0000 mg | Freq: Once | INTRAVENOUS | Status: AC
  Administered 2020-12-01: 2000 mg via INTRAVENOUS
  Filled 2020-12-01: qty 400

## 2020-12-01 MED ORDER — FOLIC ACID 5 MG/ML IJ SOLN
1.0000 mg | Freq: Every day | INTRAMUSCULAR | Status: DC
  Administered 2020-12-02 – 2020-12-04 (×3): 1 mg via INTRAVENOUS
  Filled 2020-12-01 (×5): qty 0.2

## 2020-12-01 MED ORDER — LACTATED RINGERS IV BOLUS
1000.0000 mL | Freq: Once | INTRAVENOUS | Status: AC
  Administered 2020-12-02: 1000 mL via INTRAVENOUS

## 2020-12-01 MED ORDER — VANCOMYCIN HCL 1500 MG/300ML IV SOLN
1500.0000 mg | Freq: Two times a day (BID) | INTRAVENOUS | Status: DC
  Administered 2020-12-02: 1500 mg via INTRAVENOUS
  Filled 2020-12-01 (×2): qty 300

## 2020-12-01 NOTE — Progress Notes (Signed)
RT NOTE: attempted SBT on patient x3 however each time patient went apneic and back-up ventilation alarmed.  Placed patient back on full support settings.  Spoke with RN who stated will work on sedation.  Will attempt again when able.  Will continue to monitor.

## 2020-12-01 NOTE — Progress Notes (Signed)
Pharmacy Antibiotic Note  Ernest Rodgers is a 50 y.o. male admitted on 11/26/2020 with pneumonia.  Pharmacy has been consulted for Vancomycin dosing.  Vancomycin 1500 mg IV Q 12 hrs. Goal AUC 400-550. Expected AUC: 463 SCr used: 0.72   Plan: Vanc 2000 mg IV x 1, then 1500 mg IV q12hr Monitor renal function, clinical status, C&S and vanc levels as needed  Height: 5\' 10"  (177.8 cm) Weight: 119.4 kg (263 lb 3.7 oz) IBW/kg (Calculated) : 73  Temp (24hrs), Avg:100.4 F (38 C), Min:99.1 F (37.3 C), Max:101.9 F (38.8 C)  Recent Labs  Lab 11/26/20 0907 11/26/20 1934 11/26/20 2231 11/27/20 0208 11/28/20 0250 11/29/20 0257 11/29/20 1358 11/30/20 0536 12/01/20 0016  WBC 7.5  --   --  5.0  --  5.4  --  5.0 6.1  CREATININE 0.79  --   --  0.93 1.43* 0.85 1.09 1.17 0.72  LATICACIDVEN  --  2.3* 1.7  --   --   --   --   --   --     Estimated Creatinine Clearance: 141.5 mL/min (by C-G formula based on SCr of 0.72 mg/dL).    No Known Allergies  Antimicrobials this admission: Unasyn 8/19 >>  Vanc 8/20 >>   Microbiology results:  8/18 TA: GPC and GPRs  8/18 MRSA PCR: detected  Thank you for allowing pharmacy to be a part of this patient's care.  9/18, PharmD, [facility] Clinical Pharmacist Please see AMION for all Pharmacists' Contact Phone Numbers 12/01/2020, 11:36 AM

## 2020-12-01 NOTE — Progress Notes (Signed)
eLink Physician-Brief Progress Note Patient Name: Ernest Rodgers DOB: 20-Nov-1970 MRN: [id number]   Date of Service  12/01/2020  HPI/Events of Note  Nursing concerned that legs appear mottled. Patient being packed with ice for fever. Mottling related to fever vs poor perfusion (vasodilation with fever and resulting hypovolemia). Patient is on Tylenol PRN, however, AST and ALT are both elevated. Na+ = 146, K+ = 3.8 and Creatinine = 0.72. HCO3- = 24 and Anion Gap = 9.  eICU Interventions  Plan: LR 1 liter IV over 1 hour now. D/C Tylenol. Continue ice packs PRN for fever.      Intervention Category Major Interventions: Other:  Ernest Rodgers 12/01/2020, 11:22 PM

## 2020-12-01 NOTE — Progress Notes (Signed)
NAME:  Ernest Rodgers, MRN:  [id number], DOB:  09-May-1970, LOS: 5 ADMISSION DATE:  11/26/2020, CONSULTATION DATE:  11/29/20 REFERRING MD:  Rachael Darby - FPTS, CHIEF COMPLAINT:  Unresponsive and Hypoxic    History of Present Illness:  50 yo male former smoker presented with epigastric pain.  He fell prior to admission and found to have Lt 7th rib fracture.  Found to have acute pancreatitis.  Started on CIWA for ETOH withdrawal.  Developed altered mental status with hypoxia on 8/18 and transferred to ICU.  He works as a travel Engineer, civil (consulting).  Pertinent  Medical History  ETOH, s/p Roux en Y, Diastolic CHF, HTN, Depression, s/p cholecystectomy  Significant Hospital Events: Including procedures, antibiotic start and stop dates in addition to other pertinent events   8/15 admitted to [facility] for acute pancreatitis  8/16 EtOH withdrawal sx  8/17 incr CIWA scores  8/18 found to be unresponsive on floor and hypoxic. moving to ICU, intubated  8/20 ileus versus SBO  Interim History / Subjective:  Low Vt, Increased RR with SBT.  Retaining tube feeds.   Objective   Blood pressure 117/81, pulse 79, temperature (!) 101.9 F (38.8 C), temperature source Oral, resp. rate (!) 22, height 5\' 10"  (1.778 m), weight 119.4 kg, SpO2 97 %.    Vent Mode: PRVC FiO2 (%):  [40 %] 40 % Set Rate:  [22 bmp] 22 bmp Vt Set:  [580 mL] 580 mL PEEP:  [5 cmH20] 5 cmH20 Plateau Pressure:  [14 cmH20-27 cmH20] 14 cmH20   Intake/Output Summary (Last 24 hours) at 12/01/2020 1014 Last data filed at 12/01/2020 0600 Gross per 24 hour  Intake 2244.77 ml  Output 1475 ml  Net 769.77 ml    Filed Weights   11/26/20 1516 11/30/20 0426 12/01/20 0500  Weight: 112 kg 117.6 kg 119.4 kg    Examination:  General - sedated Eyes - pupils reactive ENT - ETT in place Cardiac - regular rate/rhythm, no murmur Chest - b/l crackles Abdomen - distended, increased tympany Extremities - 1+ edema Skin - no rashes Neuro - RASS - 2  Resolved Hospital  Problem list   AKI   Assessment & Plan:   Acute hypoxic respiratory failure 2nd to aspiration pneumonitis in setting of acute pancreatitis. - full vent support - f/u CXR - goal SpO2 > 92%  Acute alcoholic pancreatitis. Ileus versus SBO on 8/20. Fatty liver. - NPO - OG tube to suction - check abdominal xray  Aspiration pneumonitis. - day 2 of unasyn - add vancomycin 8/20  Acute metabolic encephalopathy from hypoxia, pancreatitis. Acute alcohol withdrawal. Hx of depression. - prn ativan for CIWA > 8 - continue thiamine, folic acid - hold outpt fluoxetine   Hx of HTN, Chronic diastolic CHF. - monitor hemodynamics  Acute Non-Displaced L Rib Fractures: - PRN Fentanyl    Hyperglycemia. - SSI  Best Practice (right click and "Reselect all SmartList Selections" daily)   Diet/type: NPO DVT prophylaxis: LMWH GI prophylaxis: PPI Code Status:  full code Last date of multidisciplinary goals of care discussion: updated pt's wife at bedside  Labs    CMP Latest Ref Rng & Units 12/01/2020 11/30/2020 11/29/2020  Glucose 70 - 99 mg/dL 12/01/2020) 938(B) 017(P)  BUN 6 - 20 mg/dL 102(H) 85(I) 17  Creatinine 0.61 - 1.24 mg/dL 77(O 2.42 3.53  Sodium 135 - 145 mmol/L 146(H) 142 140  Potassium 3.5 - 5.1 mmol/L 3.8 3.8 3.7  Chloride 98 - 111 mmol/L 113(H) 108 104  CO2 22 - 32  mmol/L 24 24 20(L)  Calcium 8.9 - 10.3 mg/dL 5.4(U) 9.8(J) 8.3(L)  Total Protein 6.5 - 8.1 g/dL 1.9(J) 4.7(W) -  Total Bilirubin 0.3 - 1.2 mg/dL 2.0(H) 2.6(H) -  Alkaline Phos 38 - 126 U/L 102 107 -  AST 15 - 41 U/L 47(H) 96(H) -  ALT 0 - 44 U/L 160(H) 227(H) -    CBC Latest Ref Rng & Units 12/01/2020 11/30/2020 11/29/2020  WBC 4.0 - 10.5 K/uL 6.1 5.0 -  Hemoglobin 13.0 - 17.0 g/dL 12.5(L) 11.7(L) 12.2(L)  Hematocrit 39.0 - 52.0 % 37.1(L) 35.0(L) 36.0(L)  Platelets 150 - 400 K/uL 109(L) 100(L) -    ABG    Component Value Date/Time   PHART 7.345 (L) 11/29/2020 1019   PCO2ART 41.3 11/29/2020 1019   PO2ART 105  11/29/2020 1019   HCO3 22.5 11/29/2020 1019   TCO2 24 11/29/2020 1019   ACIDBASEDEF 3.0 (H) 11/29/2020 1019   O2SAT 98.0 11/29/2020 1019    CBG (last 3)  Recent Labs    11/30/20 2303 12/01/20 0307 12/01/20 0719  GLUCAP 182* 178* 176*    Critical care time: 41 minutes  Coralyn Helling, MD [place] Pager - [phone] 12/01/2020, 10:27 AM

## 2020-12-01 NOTE — Progress Notes (Signed)
Assisted tele visit to patient with family member.  Ajiah Mcglinn M, RN   

## 2020-12-02 ENCOUNTER — Inpatient Hospital Stay (HOSPITAL_COMMUNITY): Payer: Managed Care, Other (non HMO)

## 2020-12-02 DIAGNOSIS — K852 Alcohol induced acute pancreatitis without necrosis or infection: Secondary | ICD-10-CM | POA: Diagnosis not present

## 2020-12-02 LAB — GLUCOSE, CAPILLARY
Glucose-Capillary: 123 mg/dL — ABNORMAL HIGH (ref 70–99)
Glucose-Capillary: 128 mg/dL — ABNORMAL HIGH (ref 70–99)
Glucose-Capillary: 130 mg/dL — ABNORMAL HIGH (ref 70–99)
Glucose-Capillary: 130 mg/dL — ABNORMAL HIGH (ref 70–99)
Glucose-Capillary: 135 mg/dL — ABNORMAL HIGH (ref 70–99)
Glucose-Capillary: 160 mg/dL — ABNORMAL HIGH (ref 70–99)

## 2020-12-02 LAB — CULTURE, RESPIRATORY W GRAM STAIN: Special Requests: NORMAL

## 2020-12-02 LAB — CBC
HCT: 45.9 % (ref 39.0–52.0)
Hemoglobin: 15.1 g/dL (ref 13.0–17.0)
MCH: 37.5 pg — ABNORMAL HIGH (ref 26.0–34.0)
MCHC: 32.9 g/dL (ref 30.0–36.0)
MCV: 113.9 fL — ABNORMAL HIGH (ref 80.0–100.0)
Platelets: 183 10*3/uL (ref 150–400)
RBC: 4.03 MIL/uL — ABNORMAL LOW (ref 4.22–5.81)
RDW: 16.4 % — ABNORMAL HIGH (ref 11.5–15.5)
WBC: 14.1 10*3/uL — ABNORMAL HIGH (ref 4.0–10.5)
nRBC: 0.1 % (ref 0.0–0.2)

## 2020-12-02 LAB — BASIC METABOLIC PANEL
Anion gap: 10 (ref 5–15)
BUN: 36 mg/dL — ABNORMAL HIGH (ref 6–20)
CO2: 26 mmol/L (ref 22–32)
Calcium: 8.7 mg/dL — ABNORMAL LOW (ref 8.9–10.3)
Chloride: 112 mmol/L — ABNORMAL HIGH (ref 98–111)
Creatinine, Ser: 1.33 mg/dL — ABNORMAL HIGH (ref 0.61–1.24)
GFR, Estimated: >60 mL/min (ref >60)
Glucose, Bld: 165 mg/dL — ABNORMAL HIGH (ref 70–99)
Potassium: 4.3 mmol/L (ref 3.5–5.1)
Sodium: 148 mmol/L — ABNORMAL HIGH (ref 135–145)

## 2020-12-02 LAB — MAGNESIUM: Magnesium: 2 mg/dL (ref 1.7–2.4)

## 2020-12-02 LAB — PHOSPHORUS: Phosphorus: 5.7 mg/dL — ABNORMAL HIGH (ref 2.5–4.6)

## 2020-12-02 MED ORDER — LINEZOLID 600 MG/300ML IV SOLN
600.0000 mg | Freq: Two times a day (BID) | INTRAVENOUS | Status: AC
  Administered 2020-12-02 – 2020-12-07 (×12): 600 mg via INTRAVENOUS
  Filled 2020-12-02 (×12): qty 300

## 2020-12-02 MED ORDER — IOHEXOL 9 MG/ML PO SOLN
500.0000 mL | ORAL | Status: AC
Start: 2020-12-02 — End: 2020-12-02
  Administered 2020-12-02 (×2): 500 mL via ORAL

## 2020-12-02 MED ORDER — SODIUM CHLORIDE 0.9 % IV SOLN
250.0000 mL | INTRAVENOUS | Status: DC
  Administered 2020-12-02: 10 mL via INTRAVENOUS
  Administered 2020-12-06: 250 mL via INTRAVENOUS

## 2020-12-02 MED ORDER — IOHEXOL 9 MG/ML PO SOLN
ORAL | Status: AC
  Administered 2020-12-02: 500 mL
  Filled 2020-12-02: qty 1000

## 2020-12-02 MED ORDER — PHENYLEPHRINE HCL-NACL 20-0.9 MG/250ML-% IV SOLN
25.0000 ug/min | INTRAVENOUS | Status: DC
Start: 2020-12-02 — End: 2020-12-03
  Administered 2020-12-02 – 2020-12-03 (×2): 25 ug/min via INTRAVENOUS
  Filled 2020-12-02 (×2): qty 250

## 2020-12-02 MED ORDER — VANCOMYCIN VARIABLE DOSE PER UNSTABLE RENAL FUNCTION (PHARMACIST DOSING): Status: DC

## 2020-12-02 MED ORDER — ACETAMINOPHEN 10 MG/ML IV SOLN
1000.0000 mg | Freq: Four times a day (QID) | INTRAVENOUS | Status: AC | PRN
  Administered 2020-12-03: 1000 mg via INTRAVENOUS
  Filled 2020-12-02: qty 100

## 2020-12-02 MED ORDER — LACTATED RINGERS IV BOLUS
1000.0000 mL | Freq: Once | INTRAVENOUS | Status: AC
  Administered 2020-12-02: 1000 mL via INTRAVENOUS

## 2020-12-02 NOTE — Progress Notes (Signed)
RT NOTE: Unable to do SBT on patient this AM due to FIO2 requirements.  Patient noted to be dyssynchronous with ventilator at this time.  Will see if any adjustments can be made to help.  Will continue to monitor.

## 2020-12-02 NOTE — Consult Note (Signed)
Reason for Consult: Small bowel obstruction Referring Physician: Dr. Adonis Brook Hinderman is an 50 y.o. male.  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 Patient was admitted with elevated lipase on 8/15 of  1200.  Subsequently patient went into hypoxic respiratory failure secondary to aspiration pneumonia.  Patient also with acute metabolic encephalopathy secondary to pancreatitis, alcohol withdrawal. Patient was intubated.  Patient was subsequently continued with NG tube to feeds.  Patient had abdominal distention.  Patient underwent CT scan with oral contrast.  Patient was found to have distal SBO.  General surgery was consulted for further evaluation.  Past Medical History:  Diagnosis Date   Hypertension    Kidney stone     Past Surgical History:  Procedure Laterality Date   GASTRECTOMY      History reviewed. No pertinent family history.  Social History:  reports that he has never smoked. He has never used smokeless tobacco. No history on file for alcohol use and drug use.  Allergies: No Known Allergies  Medications: I have reviewed the patient's current medications.  Results for orders placed or performed during the hospital encounter of 11/26/20 (from the past 48 hour(s))  Magnesium     Status: None   Collection Time: 11/30/20  4:28 PM  Result Value Ref Range   Magnesium 1.9 1.7 - 2.4 mg/dL    Comment: Performed at [facility], [address] [place], [place] [address]  Phosphorus     Status: None   Collection Time: 11/30/20  4:28 PM  Result Value Ref Range   Phosphorus 2.5 2.5 - 4.6 mg/dL    Comment: Performed at [facility], [address] [place], [place] [address]  Glucose, capillary     Status: Abnormal   Collection Time: 11/30/20  4:42 PM  Result Value Ref Range   Glucose-Capillary 165 (H) 70 - 99 mg/dL    Comment: Glucose  reference range applies only to samples taken after fasting for at least 8 hours.  Glucose, capillary     Status: Abnormal   Collection Time: 11/30/20  7:15 PM  Result Value Ref Range   Glucose-Capillary 168 (H) 70 - 99 mg/dL    Comment: Glucose reference range applies only to samples taken after fasting for at least 8 hours.  Glucose, capillary     Status: Abnormal   Collection Time: 11/30/20 11:03 PM  Result Value Ref Range   Glucose-Capillary 182 (H) 70 - 99 mg/dL    Comment: Glucose reference range applies only to samples taken after fasting for at least 8 hours.  Comprehensive metabolic panel     Status: Abnormal   Collection Time: 12/01/20 12:16 AM  Result Value Ref Range   Sodium 146 (H) 135 - 145 mmol/L   Potassium 3.8 3.5 - 5.1 mmol/L   Chloride 113 (H) 98 - 111 mmol/L   CO2 24 22 - 32 mmol/L   Glucose, Bld 182 (H) 70 - 99 mg/dL    Comment: Glucose reference range applies only to samples taken after fasting for at least 8 hours.   BUN 23 (H) 6 - 20 mg/dL   Creatinine, Ser 5.46 0.61 - 1.24 mg/dL   Calcium 8.8 (L) 8.9 - 10.3 mg/dL   Total Protein 5.6 (L) 6.5 - 8.1 g/dL   Albumin 2.3 (L) 3.5 - 5.0 g/dL   AST 47 (H) 15 -  41 U/L   ALT 160 (H) 0 - 44 U/L   Alkaline Phosphatase 102 38 - 126 U/L   Total Bilirubin 2.0 (H) 0.3 - 1.2 mg/dL   GFR, Estimated >16 >10 mL/min    Comment: (NOTE) Calculated using the CKD-EPI Creatinine Equation (2021)    Anion gap 9 5 - 15    Comment: Performed at [facility], [address], [place], [place] [address]  CBC     Status: Abnormal   Collection Time: 12/01/20 12:16 AM  Result Value Ref Range   WBC 6.1 4.0 - 10.5 K/uL   RBC 3.33 (L) 4.22 - 5.81 MIL/uL   Hemoglobin 12.5 (L) 13.0 - 17.0 g/dL   HCT 40.9 (L) 81.1 - 91.4 %   MCV 111.4 (H) 80.0 - 100.0 fL   MCH 37.5 (H) 26.0 - 34.0 pg   MCHC 33.7 30.0 - 36.0 g/dL   RDW 78.2 (H) 95.6 - 21.3 %   Platelets 109 (L) 150 - 400 K/uL    Comment: Immature Platelet Fraction may be clinically  indicated, consider ordering this additional test [id number] CONSISTENT WITH PREVIOUS RESULT REPEATED TO VERIFY    nRBC 0.3 (H) 0.0 - 0.2 %    Comment: Performed at [facility], [address] [place], [place] [address]  Protime-INR     Status: None   Collection Time: 12/01/20 12:16 AM  Result Value Ref Range   Prothrombin Time 15.1 11.4 - 15.2 seconds   INR 1.2 0.8 - 1.2    Comment: (NOTE) INR goal varies based on device and disease states. Performed at [facility], [address], [place], [place] [address]   Glucose, capillary     Status: Abnormal   Collection Time: 12/01/20  3:07 AM  Result Value Ref Range   Glucose-Capillary 178 (H) 70 - 99 mg/dL    Comment: Glucose reference range applies only to samples taken after fasting for at least 8 hours.  Glucose, capillary     Status: Abnormal   Collection Time: 12/01/20  7:19 AM  Result Value Ref Range   Glucose-Capillary 176 (H) 70 - 99 mg/dL    Comment: Glucose reference range applies only to samples taken after fasting for at least 8 hours.  Glucose, capillary     Status: Abnormal   Collection Time: 12/01/20 11:14 AM  Result Value Ref Range   Glucose-Capillary 185 (H) 70 - 99 mg/dL    Comment: Glucose reference range applies only to samples taken after fasting for at least 8 hours.  Glucose, capillary     Status: Abnormal   Collection Time: 12/01/20  3:15 PM  Result Value Ref Range   Glucose-Capillary 144 (H) 70 - 99 mg/dL    Comment: Glucose reference range applies only to samples taken after fasting for at least 8 hours.  Glucose, capillary     Status: Abnormal   Collection Time: 12/01/20  7:08 PM  Result Value Ref Range   Glucose-Capillary 138 (H) 70 - 99 mg/dL    Comment: Glucose reference range applies only to samples taken after fasting for at least 8 hours.  Glucose, capillary     Status: Abnormal   Collection Time: 12/01/20 11:08 PM  Result Value Ref Range   Glucose-Capillary 159 (H) 70 - 99 mg/dL     Comment: Glucose reference range applies only to samples taken after fasting for at least 8 hours.  CBC     Status: Abnormal   Collection  Time: 12/02/20 12:15 AM  Result Value Ref Range   WBC 14.1 (H) 4.0 - 10.5 K/uL   RBC 4.03 (L) 4.22 - 5.81 MIL/uL   Hemoglobin 15.1 13.0 - 17.0 g/dL   HCT 41.7 40.8 - 14.4 %   MCV 113.9 (H) 80.0 - 100.0 fL   MCH 37.5 (H) 26.0 - 34.0 pg   MCHC 32.9 30.0 - 36.0 g/dL   RDW 81.8 (H) 56.3 - 14.9 %   Platelets 183 150 - 400 K/uL   nRBC 0.1 0.0 - 0.2 %    Comment: Performed at [facility], [address], [place], [place] [address]  Basic metabolic panel     Status: Abnormal   Collection Time: 12/02/20 12:15 AM  Result Value Ref Range   Sodium 148 (H) 135 - 145 mmol/L   Potassium 4.3 3.5 - 5.1 mmol/L   Chloride 112 (H) 98 - 111 mmol/L   CO2 26 22 - 32 mmol/L   Glucose, Bld 165 (H) 70 - 99 mg/dL    Comment: Glucose reference range applies only to samples taken after fasting for at least 8 hours.   BUN 36 (H) 6 - 20 mg/dL   Creatinine, Ser 7.85 (H) 0.61 - 1.24 mg/dL   Calcium 8.7 (L) 8.9 - 10.3 mg/dL   GFR, Estimated >88 >50 mL/min    Comment: (NOTE) Calculated using the CKD-EPI Creatinine Equation (2021)    Anion gap 10 5 - 15    Comment: Performed at [facility], [address], [place], [place] [address]  Magnesium     Status: None   Collection Time: 12/02/20 12:15 AM  Result Value Ref Range   Magnesium 2.0 1.7 - 2.4 mg/dL    Comment: Performed at [facility], [address] [place], [place] [address]  Phosphorus     Status: Abnormal   Collection Time: 12/02/20 12:15 AM  Result Value Ref Range   Phosphorus 5.7 (H) 2.5 - 4.6 mg/dL    Comment: Performed at [facility], [address], [place], [place] [address]  Glucose, capillary     Status: Abnormal   Collection Time: 12/02/20  3:09 AM  Result Value Ref Range   Glucose-Capillary 130 (H) 70 - 99 mg/dL    Comment: Glucose reference range applies only to samples  taken after fasting for at least 8 hours.  Glucose, capillary     Status: Abnormal   Collection Time: 12/02/20  7:21 AM  Result Value Ref Range   Glucose-Capillary 123 (H) 70 - 99 mg/dL    Comment: Glucose reference range applies only to samples taken after fasting for at least 8 hours.  Glucose, capillary     Status: Abnormal   Collection Time: 12/02/20 11:09 AM  Result Value Ref Range   Glucose-Capillary 128 (H) 70 - 99 mg/dL    Comment: Glucose reference range applies only to samples taken after fasting for at least 8 hours.  Glucose, capillary     Status: Abnormal   Collection Time: 12/02/20  3:05 PM  Result Value Ref Range   Glucose-Capillary 135 (H) 70 - 99 mg/dL    Comment: Glucose reference range applies only to samples taken after fasting for at least 8 hours.    CT ABDOMEN PELVIS WO CONTRAST  Result Date: 12/02/2020 CLINICAL DATA:  Bowel obstruction suspected Pancreatitis, persistent EXAM: CT ABDOMEN AND PELVIS WITHOUT CONTRAST TECHNIQUE: Multidetector CT imaging of the abdomen and pelvis was performed following the  standard protocol without IV contrast. COMPARISON:  11/26/2020 FINDINGS: Lower chest: Bilateral lower lobe airspace opacities. Small left pleural effusion. Hepatobiliary: Prior cholecystectomy. Diffuse fatty infiltration of the liver. No focal hepatic abnormality. Pancreas: Inflammation/stranding around the pancreas, slightly increased since prior study compatible with acute pancreatitis. No ductal dilatation. Spleen: No focal abnormality.  Normal size. Adrenals/Urinary Tract: No adrenal abnormality. No focal renal abnormality. No stones or hydronephrosis. Urinary bladder is unremarkable. Foley catheter within the bladder. Stomach/Bowel: Stomach and large bowel unremarkable. Small bowel is dilated into the pelvis. Distal small bowel is decompressed. Findings compatible with distal small bowel obstruction. Exact transition not visualized. Vascular/Lymphatic: No evidence of  aneurysm or adenopathy. Reproductive: No visible focal abnormality. Other: Small amount of free fluid in the abdomen and pelvis. Musculoskeletal: No acute bony abnormality. IMPRESSION: Dilated small bowel loops into the pelvis with decompressed distal small bowel. Findings compatible with distal small bowel obstruction. Changes of acute pancreatitis, slightly worsening since prior study. Hepatic steatosis. Bilateral airspace opacity/consolidation. This could reflect atelectasis or pneumonia. Trace left effusion. Small amount of free fluid in the abdomen and pelvis. Electronically Signed   By: Charlett NoseKevin  Dover M.D.   On: 12/02/2020 15:00   DG Chest Port 1 View  Result Date: 12/02/2020 CLINICAL DATA:  Respiratory failure EXAM: PORTABLE CHEST 1 VIEW COMPARISON:  11/29/2020 FINDINGS: Endotracheal tube terminates 2.6 cm above carina. Nasogastric tube terminates in the body of the stomach with the side port likely at the gastroesophageal junction. Apical lordotic positioning. Cardiomegaly accentuated by AP portable technique. No pleural effusion or pneumothorax. No congestive failure. Low lung volumes with resultant pulmonary interstitial prominence. Mild bibasilar atelectasis. IMPRESSION: Cardiomegaly and low lung volumes, without acute disease. Nasogastric tube with side port likely at the level of the gastroesophageal junction. Consider advancement. Electronically Signed   By: Jeronimo GreavesKyle  Talbot M.D.   On: 12/02/2020 11:45   DG Abd Portable 1V  Result Date: 12/01/2020 CLINICAL DATA:  Ileus.  Pancreatitis. EXAM: PORTABLE ABDOMEN - 1 VIEW COMPARISON:  11/29/2020 and older exams.  CT, 11/26/2020. FINDINGS: There is increased bowel gas with mild dilation of small bowel and colon, similar to the exam from 11/29/2020. Nasogastric tube tip projects in the mid stomach, stable. Stable surgical vascular clips in the left mid abdomen and right upper quadrant. IMPRESSION: 1. Increased bowel gas in mild bowel dilation consistent with  an adynamic ileus, similar to the most recent prior study. Electronically Signed   By: Amie Portlandavid  Ormond M.D.   On: 12/01/2020 12:17    Review of Systems  Unable to perform ROS: Intubated  Blood pressure 93/66, pulse 90, temperature 99 F (37.2 C), temperature source Oral, resp. rate 12, height 5\' 10"  (1.778 m), weight 124.3 kg, SpO2 97 %. Physical Exam Constitutional:      Appearance: He is well-developed.     Comments: Conversant No acute distress  HENT:     Head: Normocephalic and atraumatic.  Eyes:     General: Lids are normal. No scleral icterus.    Pupils: Pupils are equal, round, and reactive to light.     Comments: Pupils are equal round and reactive No lid lag Moist conjunctiva  Neck:     Thyroid: No thyromegaly.     Trachea: No tracheal tenderness.     Comments: No cervical lymphadenopathy Cardiovascular:     Rate and Rhythm: Normal rate and regular rhythm.     Heart sounds: No murmur heard. Pulmonary:     Effort: Pulmonary effort is normal.  Breath sounds: Normal breath sounds. No wheezing or rales.  Abdominal:     General: Abdomen is protuberant. There is distension.     Tenderness: There is no abdominal tenderness. There is no guarding or rebound.     Hernia: No hernia is present.  Musculoskeletal:     Cervical back: Normal range of motion and neck supple.  Skin:    General: Skin is warm.     Findings: No rash.     Nails: There is no clubbing.     Comments: Normal skin turgor  Neurological:     Mental Status: He is alert and oriented to person, place, and time.     Comments: Normal gait and station  Psychiatric:        Mood and Affect: Mood normal.        Thought Content: Thought content normal.        Judgment: Judgment normal.     Comments: Appropriate affect    Assessment/Plan: 50 year old male with alcoholic pancreatitis SBO Aspiration pneumonitis Acute hypoxic respiratory failure   1.  At this time would recommend continuing with serial  abdominal exams.  There does appear to be some contrast within the small bowel.  We can follow this to see if this makes it into the colon. Continue with NG tube suction at this point.  Patient may need potential TNA if prolonged ileus. 2.  We will continue to follow.  Axel Filler 12/02/2020, 3:40 PM

## 2020-12-02 NOTE — Progress Notes (Signed)
NAME:  Ernest Rodgers, MRN:  [id number], DOB:  Apr 24, 1970, LOS: 6 ADMISSION DATE:  11/26/2020, CONSULTATION DATE:  11/29/20 REFERRING MD:  Rachael Darby - FPTS, CHIEF COMPLAINT:  Unresponsive and Hypoxic    History of Present Illness:  50 yo male former smoker presented with epigastric pain.  He fell prior to admission and found to have Lt 7th rib fracture.  Found to have acute pancreatitis.  Started on CIWA for ETOH withdrawal.  Developed altered mental status with hypoxia on 8/18 and transferred to ICU.  He works as a travel Engineer, civil (consulting).  Pertinent  Medical History  ETOH, s/p Roux en Y, Diastolic CHF, HTN, Depression, s/p cholecystectomy  Significant Hospital Events: Including procedures, antibiotic start and stop dates in addition to other pertinent events   8/15 admitted to [facility] for acute pancreatitis  8/16 EtOH withdrawal sx  8/17 incr CIWA scores  8/18 found to be unresponsive on floor and hypoxic. moving to ICU, intubated  8/20 ileus versus SBO  Interim History / Subjective:  Remains on vent support, sedation.  Needed fluid bolus overnight.  Tm 103F  Objective   Blood pressure 112/78, pulse (!) 104, temperature (!) 103.3 F (39.6 C), temperature source Oral, resp. rate 19, height 5\' 10"  (1.778 m), weight 124.3 kg, SpO2 91 %.    Vent Mode: PRVC FiO2 (%):  [40 %-100 %] 80 % Set Rate:  [22 bmp] 22 bmp Vt Set:  [580 mL] 580 mL PEEP:  [5 cmH20] 5 cmH20 Plateau Pressure:  [18 cmH20-20 cmH20] 18 cmH20   Intake/Output Summary (Last 24 hours) at 12/02/2020 0756 Last data filed at 12/02/2020 12/04/2020 Gross per 24 hour  Intake 3555.21 ml  Output 925 ml  Net 2630.21 ml   Filed Weights   11/30/20 0426 12/01/20 0500 12/02/20 0500  Weight: 117.6 kg 119.4 kg 124.3 kg    Examination:  General - sedated Eyes - pupils reactive ENT - no sinus tenderness, no stridor Cardiac - regular rate/rhythm, no murmur Chest - scattered rhonchi Abdomen - mild distention, increased tympany, decreased bowel  sounds Extremities - cool, 1+ edema Skin - no rashes Neuro - RASS -2  Abd xray - adynamic ileus  Resolved Hospital Problem list   AKI   Assessment & Plan:   Acute hypoxic respiratory failure 2nd to aspiration pneumonitis in setting of acute pancreatitis. - change PEEP to 8 cm H2O - goal SpO2 > 92%  Acute alcoholic pancreatitis. Persistent ileus. Fatty liver. - keep NPO - repeat CT abd/pelvis without contrast - OG tube to suction  Aspiration pneumonitis with Staph aureus in sputum. - day 3 of unasyn, day 2 of vancomycin - prn IV acetaminophen for fever  Acute metabolic encephalopathy from hypoxia, pancreatitis. Acute alcohol withdrawal. Hx of depression. - continue precedex for RASS goal 0 to -1 - prn ativan for CIWA > 8 - continue thiamine, folic acid - hold outpt fluoxetine   Hx of HTN, Chronic diastolic CHF. - monitor hemodynamics  Acute Non-Displaced L Rib Fractures: - PRN Fentanyl    Hyperglycemia. - SSI  Best Practice (right click and "Reselect all SmartList Selections" daily)   Diet/type: NPO DVT prophylaxis: LMWH GI prophylaxis: PPI Code Status:  full code Last date of multidisciplinary goals of care discussion: updated family at bedside on 8/21  Labs    CMP Latest Ref Rng & Units 12/02/2020 12/01/2020 11/30/2020  Glucose 70 - 99 mg/dL 12/02/2020) 967(E) 938(B)  BUN 6 - 20 mg/dL 017(P) 10(C) 58(N)  Creatinine 0.61 - 1.24 mg/dL  1.33(H) 0.72 1.17  Sodium 135 - 145 mmol/L 148(H) 146(H) 142  Potassium 3.5 - 5.1 mmol/L 4.3 3.8 3.8  Chloride 98 - 111 mmol/L 112(H) 113(H) 108  CO2 22 - 32 mmol/L 26 24 24   Calcium 8.9 - 10.3 mg/dL ) 2.8(Z) 6.6(Q)  Total Protein 6.5 - 8.1 g/dL - 5.6(L) 5.5(L)  Total Bilirubin 0.3 - 1.2 mg/dL - 2.0(H) 2.6(H)  Alkaline Phos 38 - 126 U/L - 102 107  AST 15 - 41 U/L - 47(H) 96(H)  ALT 0 - 44 U/L - 160(H) 227(H)    CBC Latest Ref Rng & Units 12/02/2020 12/01/2020 11/30/2020  WBC 4.0 - 10.5 K/uL 14.1(H) 6.1 5.0  Hemoglobin 13.0  - 17.0 g/dL 12/02/2020 12.5(L) 11.7(L)  Hematocrit 39.0 - 52.0 % 45.9 37.1(L) 35.0(L)  Platelets 150 - 400 K/uL 183 109(L) 100(L)    ABG    Component Value Date/Time   PHART 7.345 (L) 11/29/2020 1019   PCO2ART 41.3 11/29/2020 1019   PO2ART 105 11/29/2020 1019   HCO3 22.5 11/29/2020 1019   TCO2 24 11/29/2020 1019   ACIDBASEDEF 3.0 (H) 11/29/2020 1019   O2SAT 98.0 11/29/2020 1019    CBG (last 3)  Recent Labs    12/01/20 2308 12/02/20 0309 12/02/20 0721  GLUCAP 159* 130* 123*    Critical care time: 36 minutes  12/04/20, MD [place] [facility]monary/Critical Care Pager - [phone] 12/02/2020, 7:56 AM

## 2020-12-02 NOTE — Progress Notes (Signed)
Patient transported to CT and back to room 2M04 without complications.

## 2020-12-02 NOTE — Progress Notes (Signed)
Pharmacy Antibiotic Note  Ernest Rodgers is a 50 y.o. male admitted on 11/26/2020 with pneumonia.  Pharmacy has been consulted for Vancomycin dosing.  Vancomycin 1500 mg IV Q 12 hrs. Goal AUC 400-550. Expected AUC: 463 SCr used: 0.72  ASSESSMENT-  Trach aspirate came back as MRSA. Serum creatinine doubled and UOP was down yesterday. Recommend switching from vanc to Linezolid.  Plan: D/C Vanc Start Linezolid 600 mg IV q12hr  Height: 5\' 10"  (177.8 cm) Weight: 124.3 kg (274 lb 0.5 oz) IBW/kg (Calculated) : 73  Temp (24hrs), Avg:100.4 F (38 C), Min:98.7 F (37.1 C), Max:103.3 F (39.6 C)  Recent Labs  Lab 11/26/20 1934 11/26/20 2231 11/27/20 0208 11/28/20 0250 11/29/20 0257 11/29/20 1358 11/30/20 0536 12/01/20 0016 12/02/20 0015  WBC  --   --  5.0  --  5.4  --  5.0 6.1 14.1*  CREATININE  --   --  0.93   < > 0.85 1.09 1.17 0.72 1.33*  LATICACIDVEN 2.3* 1.7  --   --   --   --   --   --   --    < > = values in this interval not displayed.     Estimated Creatinine Clearance: 86.9 mL/min (A) (by C-G formula based on SCr of 1.33 mg/dL (H)).    No Known Allergies  Antimicrobials this admission: Unasyn 8/19 >>  Vanc 8/20 >>   Microbiology results:  8/18 TA: MRSA 8/18 MRSA PCR: detected  Thank you for allowing pharmacy to be a part of this patient's care.  9/18, PharmD, [facility] Clinical Pharmacist Please see AMION for all Pharmacists' Contact Phone Numbers 12/02/2020, 2:09 PM

## 2020-12-02 NOTE — Progress Notes (Signed)
CT abd/pelvis reviewed.  Shows distal small bowel obstruction.  D/w pt's wife at bedside.  Consult placed with surgery.  Coralyn Helling, MD [facility] Pulmonary/Critical Care Pager - [phone] 12/02/2020, 3:16 PM

## 2020-12-03 ENCOUNTER — Inpatient Hospital Stay (HOSPITAL_COMMUNITY): Payer: Managed Care, Other (non HMO)

## 2020-12-03 DIAGNOSIS — K852 Alcohol induced acute pancreatitis without necrosis or infection: Secondary | ICD-10-CM | POA: Diagnosis not present

## 2020-12-03 LAB — COMPREHENSIVE METABOLIC PANEL
ALT: 63 U/L — ABNORMAL HIGH (ref 0–44)
AST: 48 U/L — ABNORMAL HIGH (ref 15–41)
Albumin: 1.8 g/dL — ABNORMAL LOW (ref 3.5–5.0)
Alkaline Phosphatase: 63 U/L (ref 38–126)
Anion gap: 14 (ref 5–15)
BUN: 58 mg/dL — ABNORMAL HIGH (ref 6–20)
CO2: 21 mmol/L — ABNORMAL LOW (ref 22–32)
Calcium: 8 mg/dL — ABNORMAL LOW (ref 8.9–10.3)
Chloride: 113 mmol/L — ABNORMAL HIGH (ref 98–111)
Creatinine, Ser: 2.22 mg/dL — ABNORMAL HIGH (ref 0.61–1.24)
GFR, Estimated: 35 mL/min — ABNORMAL LOW (ref >60)
Glucose, Bld: 151 mg/dL — ABNORMAL HIGH (ref 70–99)
Potassium: 4 mmol/L (ref 3.5–5.1)
Sodium: 148 mmol/L — ABNORMAL HIGH (ref 135–145)
Total Bilirubin: 1.8 mg/dL — ABNORMAL HIGH (ref 0.3–1.2)
Total Protein: 5.3 g/dL — ABNORMAL LOW (ref 6.5–8.1)

## 2020-12-03 LAB — CBC
HCT: 41.5 % (ref 39.0–52.0)
Hemoglobin: 13.6 g/dL (ref 13.0–17.0)
MCH: 37.5 pg — ABNORMAL HIGH (ref 26.0–34.0)
MCHC: 32.8 g/dL (ref 30.0–36.0)
MCV: 114.3 fL — ABNORMAL HIGH (ref 80.0–100.0)
Platelets: 194 10*3/uL (ref 150–400)
RBC: 3.63 MIL/uL — ABNORMAL LOW (ref 4.22–5.81)
RDW: 16.1 % — ABNORMAL HIGH (ref 11.5–15.5)
WBC: 15.4 10*3/uL — ABNORMAL HIGH (ref 4.0–10.5)
nRBC: 0.3 % — ABNORMAL HIGH (ref 0.0–0.2)

## 2020-12-03 LAB — VITAMIN A: Vitamin A (Retinoic Acid): 11.3 ug/dL — ABNORMAL LOW (ref 20.1–62.0)

## 2020-12-03 LAB — GLUCOSE, CAPILLARY
Glucose-Capillary: 133 mg/dL — ABNORMAL HIGH (ref 70–99)
Glucose-Capillary: 149 mg/dL — ABNORMAL HIGH (ref 70–99)
Glucose-Capillary: 161 mg/dL — ABNORMAL HIGH (ref 70–99)
Glucose-Capillary: 184 mg/dL — ABNORMAL HIGH (ref 70–99)
Glucose-Capillary: 179 mg/dL — ABNORMAL HIGH (ref 70–99)
Glucose-Capillary: 245 mg/dL — ABNORMAL HIGH (ref 70–99)

## 2020-12-03 LAB — MAGNESIUM: Magnesium: 1.8 mg/dL (ref 1.7–2.4)

## 2020-12-03 MED ORDER — INSULIN ASPART 100 UNIT/ML IJ SOLN
0.0000 [IU] | INTRAMUSCULAR | Status: DC
Start: 2020-12-04 — End: 2020-12-05
  Administered 2020-12-04 (×2): 2 [IU] via SUBCUTANEOUS
  Administered 2020-12-04: 1 [IU] via SUBCUTANEOUS
  Administered 2020-12-04: 2 [IU] via SUBCUTANEOUS
  Administered 2020-12-04: 1 [IU] via SUBCUTANEOUS
  Administered 2020-12-04: 3 [IU] via SUBCUTANEOUS
  Administered 2020-12-05 (×2): 1 [IU] via SUBCUTANEOUS
  Administered 2020-12-05: 2 [IU] via SUBCUTANEOUS

## 2020-12-03 MED ORDER — SODIUM CHLORIDE 0.9 % IV SOLN
250.0000 mL | INTRAVENOUS | Status: DC
Start: 2020-12-03 — End: 2020-12-13
  Administered 2020-12-03: 250 mL via INTRAVENOUS

## 2020-12-03 MED ORDER — MAGNESIUM SULFATE 2 GM/50ML IV SOLN
2.0000 g | Freq: Once | INTRAVENOUS | Status: AC
  Administered 2020-12-03: 2 g via INTRAVENOUS
  Filled 2020-12-03: qty 50

## 2020-12-03 MED ORDER — SODIUM CHLORIDE 0.9 % IV SOLN
2.0000 g | INTRAVENOUS | Status: AC
  Administered 2020-12-03 – 2020-12-09 (×7): 2 g via INTRAVENOUS
  Filled 2020-12-03 (×8): qty 20

## 2020-12-03 MED ORDER — ENOXAPARIN SODIUM 60 MG/0.6ML IJ SOSY
60.0000 mg | PREFILLED_SYRINGE | Freq: Every day | INTRAMUSCULAR | Status: DC
  Administered 2020-12-03 – 2020-12-04 (×2): 60 mg via SUBCUTANEOUS
  Filled 2020-12-03 (×3): qty 0.6

## 2020-12-03 MED ORDER — DIATRIZOATE MEGLUMINE & SODIUM 66-10 % PO SOLN
90.0000 mL | Freq: Once | ORAL | Status: AC
  Administered 2020-12-03: 90 mL via NASOGASTRIC
  Filled 2020-12-03: qty 90

## 2020-12-03 MED ORDER — NOREPINEPHRINE 4 MG/250ML-% IV SOLN
2.0000 ug/min | INTRAVENOUS | Status: DC
  Administered 2020-12-03: 5 ug/min via INTRAVENOUS
  Administered 2020-12-04: 2 ug/min via INTRAVENOUS
  Filled 2020-12-03 (×2): qty 250

## 2020-12-03 MED ORDER — FUROSEMIDE 10 MG/ML IJ SOLN
40.0000 mg | Freq: Once | INTRAMUSCULAR | Status: AC
  Administered 2020-12-03: 40 mg via INTRAVENOUS
  Filled 2020-12-03: qty 4

## 2020-12-03 NOTE — Progress Notes (Signed)
Subjective: CC: Patient intubated on the vent. On precedex. Does not open eyes. Wife at beside and is an Charity fundraiser that works here. She reports after OGT placement yesterday they got out ~1L. There is ~200cc in the cannister this morning. Wife reports he is less distended and tight in his upper abdomen. No BM.  Objective: Vital signs in last 24 hours: Temp:  [98.7 F (37.1 C)-103.2 F (39.6 C)] 103.2 F (39.6 C) (08/22 0837) Pulse Rate:  [82-92] 82 (08/22 0815) Resp:  [12-30] 24 (08/22 0815) BP: (70-125)/(48-107) 117/76 (08/22 0815) SpO2:  [92 %-97 %] 93 % (08/22 0815) FiO2 (%):  [60 %-70 %] 60 % (08/22 0800) Weight:  [121.9 kg] 121.9 kg (08/22 0500) Last BM Date: 11/26/20  Intake/Output from previous day: 08/21 0701 - 08/22 0700 In: 6060.1 [I.V.:3489.2; NG/GT:20; IV Piggyback:1700.9] Out: 2160 [Urine:1260; Emesis/NG output:900] Intake/Output this shift: Total I/O In: 149.2 [I.V.:149.2] Out: -   Vent Mode: PRVC FiO2 (%):  [60 %-70 %] 60 % Set Rate:  [22 bmp] 22 bmp Vt Set:  [580 mL] 580 mL PEEP:  [8 cmH20] 8 cmH20 Plateau Pressure:  [11 cmH20-19 cmH20] 19 cmH20   PE: Gen:  Intubated on vent Card:  RRR Pulm:  CTAB, no W/R/R, effort normal Abd: Soft, mildly distended, no guarding or rigidity, hypoactive bowel sounds.  GU: Straw colored urine  Lab Results:  Recent Labs    12/02/20 0015 12/03/20 0113  WBC 14.1* 15.4*  HGB 15.1 13.6  HCT 45.9 41.5  PLT 183 194   BMET Recent Labs    12/02/20 0015 12/03/20 0113  NA 148* 148*  K 4.3 4.0  CL 112* 113*  CO2 26 21*  GLUCOSE 165* 151*  BUN 36* 58*  CREATININE 1.33* 2.22*  CALCIUM 8.7* 8.0*   PT/INR Recent Labs    11/30/20 1134 12/01/20 0016  LABPROT 15.2 15.1  INR 1.2 1.2   CMP     Component Value Date/Time   NA 148 (H) 12/03/2020 0113   K 4.0 12/03/2020 0113   CL 113 (H) 12/03/2020 0113   CO2 21 (L) 12/03/2020 0113   GLUCOSE 151 (H) 12/03/2020 0113   BUN 58 (H) 12/03/2020 0113   CREATININE  2.22 (H) 12/03/2020 0113   CALCIUM 8.0 (L) 12/03/2020 0113   PROT 5.3 (L) 12/03/2020 0113   ALBUMIN 1.8 (L) 12/03/2020 0113   AST 48 (H) 12/03/2020 0113   ALT 63 (H) 12/03/2020 0113   ALKPHOS 63 12/03/2020 0113   BILITOT 1.8 (H) 12/03/2020 0113   GFRNONAA 35 (L) 12/03/2020 0113   Lipase     Component Value Date/Time   LIPASE 507 (H) 11/27/2020 0208    Studies/Results: CT ABDOMEN PELVIS WO CONTRAST  Result Date: 12/02/2020 CLINICAL DATA:  Bowel obstruction suspected Pancreatitis, persistent EXAM: CT ABDOMEN AND PELVIS WITHOUT CONTRAST TECHNIQUE: Multidetector CT imaging of the abdomen and pelvis was performed following the standard protocol without IV contrast. COMPARISON:  11/26/2020 FINDINGS: Lower chest: Bilateral lower lobe airspace opacities. Small left pleural effusion. Hepatobiliary: Prior cholecystectomy. Diffuse fatty infiltration of the liver. No focal hepatic abnormality. Pancreas: Inflammation/stranding around the pancreas, slightly increased since prior study compatible with acute pancreatitis. No ductal dilatation. Spleen: No focal abnormality.  Normal size. Adrenals/Urinary Tract: No adrenal abnormality. No focal renal abnormality. No stones or hydronephrosis. Urinary bladder is unremarkable. Foley catheter within the bladder. Stomach/Bowel: Stomach and large bowel unremarkable. Small bowel is dilated into the pelvis. Distal small bowel is decompressed.  Findings compatible with distal small bowel obstruction. Exact transition not visualized. Vascular/Lymphatic: No evidence of aneurysm or adenopathy. Reproductive: No visible focal abnormality. Other: Small amount of free fluid in the abdomen and pelvis. Musculoskeletal: No acute bony abnormality. IMPRESSION: Dilated small bowel loops into the pelvis with decompressed distal small bowel. Findings compatible with distal small bowel obstruction. Changes of acute pancreatitis, slightly worsening since prior study. Hepatic steatosis.  Bilateral airspace opacity/consolidation. This could reflect atelectasis or pneumonia. Trace left effusion. Small amount of free fluid in the abdomen and pelvis. Electronically Signed   By: Charlett Nose M.D.   On: 12/02/2020 15:00   DG Chest Port 1 View  Result Date: 12/03/2020 CLINICAL DATA:  Hypertension, respiratory failure, nephrolithiasis EXAM: PORTABLE CHEST - 1 VIEW COMPARISON:  the previous day's study FINDINGS: Endotracheal tube and nasogastric tube remain in place. Relatively low lung volumes with infrahilar consolidation/atelectasis, left greater than right as before. Heart size and mediastinal contours are within normal limits. No effusion. No definite pneumothorax although the apices are partially excluded. Visualized bones unremarkable.  Cholecystectomy clips. IMPRESSION: Stable infrahilar consolidation/atelectasis, left greater than right. Support hardware stable in position. Electronically Signed   By: Corlis Leak M.D.   On: 12/03/2020 07:43   DG Chest Port 1 View  Result Date: 12/02/2020 CLINICAL DATA:  Respiratory failure EXAM: PORTABLE CHEST 1 VIEW COMPARISON:  11/29/2020 FINDINGS: Endotracheal tube terminates 2.6 cm above carina. Nasogastric tube terminates in the body of the stomach with the side port likely at the gastroesophageal junction. Apical lordotic positioning. Cardiomegaly accentuated by AP portable technique. No pleural effusion or pneumothorax. No congestive failure. Low lung volumes with resultant pulmonary interstitial prominence. Mild bibasilar atelectasis. IMPRESSION: Cardiomegaly and low lung volumes, without acute disease. Nasogastric tube with side port likely at the level of the gastroesophageal junction. Consider advancement. Electronically Signed   By: Jeronimo Greaves M.D.   On: 12/02/2020 11:45   DG Abd Portable 1V  Result Date: 12/03/2020 CLINICAL DATA:  Respiratory failure. SBO. Best obtainable images EXAM: PORTABLE ABDOMEN - 1 VIEW COMPARISON:  CT from previous  day FINDINGS: Nasogastric tube into partially decompressed stomach. Several dilated gas-filled loops of small bowel predominantly in the left mid abdomen. Gas distended cecum in the right mid abdomen as before. The remainder of the colon is decompressed. Cholecystectomy clips. Surgical staples in the left upper and mid abdomen. No abnormal abdominal calcifications. IMPRESSION: Little convincing interval change in bowel gas pattern since previous CT, suggesting partial small bowel obstruction. Electronically Signed   By: Corlis Leak M.D.   On: 12/03/2020 07:49   DG Abd Portable 1V  Result Date: 12/01/2020 CLINICAL DATA:  Ileus.  Pancreatitis. EXAM: PORTABLE ABDOMEN - 1 VIEW COMPARISON:  11/29/2020 and older exams.  CT, 11/26/2020. FINDINGS: There is increased bowel gas with mild dilation of small bowel and colon, similar to the exam from 11/29/2020. Nasogastric tube tip projects in the mid stomach, stable. Stable surgical vascular clips in the left mid abdomen and right upper quadrant. IMPRESSION: 1. Increased bowel gas in mild bowel dilation consistent with an adynamic ileus, similar to the most recent prior study. Electronically Signed   By: Amie Portland M.D.   On: 12/01/2020 12:17    Anti-infectives: Anti-infectives (From admission, onward)    Start     Dose/Rate Route Frequency Ordered Stop   12/02/20 1500  linezolid (ZYVOX) IVPB 600 mg        600 mg 300 mL/hr over 60 Minutes Intravenous Every 12 hours  12/02/20 1407     12/02/20 1028  vancomycin variable dose per unstable renal function (pharmacist dosing)  Status:  Discontinued         Does not apply See admin instructions 12/02/20 1028 12/02/20 1407   12/02/20 0000  vancomycin (VANCOREADY) IVPB 1500 mg/300 mL  Status:  Discontinued        1,500 mg 150 mL/hr over 120 Minutes Intravenous Every 12 hours 12/01/20 1126 12/02/20 1028   12/01/20 1215  vancomycin (VANCOREADY) IVPB 2000 mg/400 mL        2,000 mg 200 mL/hr over 120 Minutes Intravenous   Once 12/01/20 1123 12/01/20 1433   11/30/20 1200  Ampicillin-Sulbactam (UNASYN) 3 g in sodium chloride 0.9 % 100 mL IVPB  Status:  Discontinued        3 g 200 mL/hr over 30 Minutes Intravenous Every 6 hours 11/30/20 1055 12/02/20 1522        Assessment/Plan 50 year old male with alcoholic pancreatitis SBO vs Ileus from above - 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 No peritonitis on exam. No indication for emergency surgery - Film reviewed with MD. OGT looks like in good position for [place] to do SBO protocol - Keep K > 4 and Mg > 2 for bowel function - We will follow with you. Appreciate CCM's assistance  FEN - NPO, OGT, IVF per CCM VTE - SCDs, Lovenox ID - On Linezolid per CCM. Febrile to 103, WBC 15.4.  Foley - in place  Alcohol Pancreatitis  Elevated LFT's - improving VDRF Aspiration pneumonitis with Staph aureus in sputum - on abx  Alcohol withdrawal - on precedex, thimaine and folic acid Acute metabolic encephalopathy HTN CHF L rib fx's   LOS: 7 days    Jacinto Halim , [facility] Surgery 12/03/2020, 8:58 AM Please see Amion for pager number during day hours 7:00am-4:30pm

## 2020-12-03 NOTE — Progress Notes (Signed)
Assisted wife with video call.

## 2020-12-03 NOTE — Progress Notes (Signed)
eLink Physician-Brief Progress Note Patient Name: Ernest Rodgers DOB: 09-09-1970 MRN: [id number]   Date of Service  12/03/2020  HPI/Events of Note  cbgs running 161 to 245  oes not have any insulin coverage.   Would you like a SSI???   No meals or tube feeding  eICU Interventions  Pancreatitis, Cr > 2  SSI q4 hr protocol ordered.      Intervention Category Intermediate Interventions: Hyperglycemia - evaluation and treatment  Ranee Gosselin 12/03/2020, 11:52 PM

## 2020-12-03 NOTE — Progress Notes (Signed)
RT NOTE: no SBT for patient this AM due to FIO2 requirements.  Tolerating current ventilator settings well at this time.  Will continue to monitor.

## 2020-12-03 NOTE — Progress Notes (Signed)
NAME:  Ernest Rodgers, MRN:  [id number], DOB:  December 20, 1970, LOS: 7 ADMISSION DATE:  11/26/2020, CONSULTATION DATE:  11/29/20 REFERRING MD:  Rachael Darby - FPTS, CHIEF COMPLAINT:  Unresponsive and Hypoxic    History of Present Illness:  50 yo male former smoker presented with epigastric pain.  He fell prior to admission and found to have Lt 7th rib fracture.  Found to have acute pancreatitis.  Started on CIWA for ETOH withdrawal.  Developed altered mental status with hypoxia on 8/18 and transferred to ICU.  He works as a travel Engineer, civil (consulting).  Pertinent  Medical History  ETOH, s/p Roux en Y, Diastolic CHF, HTN, Depression, s/p cholecystectomy  Significant Hospital Events: Including procedures, antibiotic start and stop dates in addition to other pertinent events   8/15 admitted to [facility] for acute pancreatitis  8/16 EtOH withdrawal sx  8/17 incr CIWA scores  8/18 found to be unresponsive on floor and hypoxic. moving to ICU, intubated  8/20 ileus versus SBO,  8/21 General Surgery consulted for SBO  Interim History / Subjective:  Remains on vent support, sedation.  Objective   Blood pressure 115/75, pulse 82, temperature 99.2 F (37.3 C), temperature source Oral, resp. rate (!) 24, height 5\' 10"  (1.778 m), weight 121.9 kg, SpO2 95 %.    Vent Mode: PRVC FiO2 (%):  [70 %] 70 % Set Rate:  [22 bmp] 22 bmp Vt Set:  [580 mL] 580 mL PEEP:  [5 cmH20-8 cmH20] 8 cmH20 Plateau Pressure:  [11 cmH20-19 cmH20] 19 cmH20   Intake/Output Summary (Last 24 hours) at 12/03/2020 0715 Last data filed at 12/03/2020 0700 Gross per 24 hour  Intake 6060.09 ml  Output 2160 ml  Net 3900.09 ml   Filed Weights   12/01/20 0500 12/02/20 0500 12/03/20 0500  Weight: 119.4 kg 124.3 kg 121.9 kg    Examination: Physical Exam Constitutional:      General: He is not in acute distress.    Appearance: He is ill-appearing. He is not diaphoretic.     Comments: Ill appearing, intubated, sedated.   Cardiovascular:     Rate and  Rhythm: Regular rhythm.     Pulses: Normal pulses.     Heart sounds: Normal heart sounds. No murmur heard.   No friction rub. No gallop.  Pulmonary:     Comments: Intubated, Rhonchi appreciated bilaterally Abdominal:     General: There is distension.     Comments: Distended, tympanic, decreased BS  Musculoskeletal:        General: No tenderness.     Right lower leg: Edema present.     Left lower leg: Edema present.     Comments: 1+ pitting edema bilaterally in the lower extremities  Skin:    Comments: Cool extremities, no skin changes     Resolved Hospital Problem list     Assessment & Plan:   Acute hypoxic respiratory failure 2nd to aspiration pneumonitis in setting of acute pancreatitis requiring MV:  - Continue SBT/SAT as tolerated  - Goal SpO2 > 92%  Acute Alcoholic Pancreatitis Small Bowel Obstruction v Ileus Fatty liver: - Surgery following, appreciate their assistance.  - NPO - Serial abd xray  - OG tube to suction - Correct electrolyte abnormalities as they present for goals of K >4 and Mg >2  - Trend Magnesium and CMP  Aspiration pneumonitis with MRSA:  Continues to produce fevers with a high of 103 O/N. Leukocytosis of slightly increased to 15 from 14. Unasyn and Vancomyin(8/20-8/21) D/C'd 8/21. Linezolid 8/21.  Sputum culture growing MRSA 8/21.  - Linezolid day 2 - Start Ceftriaxone per pharmacy  - prn IV acetaminophen for fever  Acute metabolic encephalopathy from hypoxia, pancreatitis. Acute Alcohol Withdrawal. Hx of Depression. - Continue precedex for RASS goal 0 to -1 - PRN Ativan for CIWA > 8 - Continue thiamine and folic acid - VAP Protocol  - hold outpt fluoxetine   Hx of HTN, Chronic diastolic CHF: Cool extremities on physical examination today, along with 1+ pitting edema in the lower extremities and is net +12.7L. Will trial Lasix today.  - Lasix 40 mg IV once - Monitor hemodynamics - Goal K >4 and Mg >2, replete as needed  AKI in the  Setting Critical Illness: Patient with decreased renal function with increase in sCr of 2.2<1.3<0.72. Likely in the setting of his critical illness. He does have a Hx of HF and is net + 12.7 L this admission, so may have a component of cardiorenal syndrome. Will trial Lasix today and continue to monitor I&O.  - Lasix 40 mg IV once - Trend CMP - Avoid nephrotoxic medications  Acute Non-Displaced L Rib Fractures: - PRN Fentanyl    Hyperglycemia. - SSI  Best Practice (right click and "Reselect all SmartList Selections" daily)   Diet/type: NPO DVT prophylaxis: LMWH GI prophylaxis: PPI Code Status:  full code Last date of multidisciplinary goals of care discussion: updated family at bedside on 8/21  Labs    CMP Latest Ref Rng & Units 12/03/2020 12/02/2020 12/01/2020  Glucose 70 - 99 mg/dL 500(X) 381(W) 299(B)  BUN 6 - 20 mg/dL 71(I) 96(V) 89(F)  Creatinine 0.61 - 1.24 mg/dL 8.10(F) 7.51(W) 2.58  Sodium 135 - 145 mmol/L 148(H) 148(H) 146(H)  Potassium 3.5 - 5.1 mmol/L 4.0 4.3 3.8  Chloride 98 - 111 mmol/L 113(H) 112(H) 113(H)  CO2 22 - 32 mmol/L 21(L) 26 24  Calcium 8.9 - 10.3 mg/dL 8.0(L) 8.7(L) 8.8(L)  Total Protein 6.5 - 8.1 g/dL 5.3(L) - 5.6(L)  Total Bilirubin 0.3 - 1.2 mg/dL 5.2(D) - 2.0(H)  Alkaline Phos 38 - 126 U/L 63 - 102  AST 15 - 41 U/L 48(H) - 47(H)  ALT 0 - 44 U/L 63(H) - 160(H)    CBC Latest Ref Rng & Units 12/03/2020 12/02/2020 12/01/2020  WBC 4.0 - 10.5 K/uL 15.4(H) 14.1(H) 6.1  Hemoglobin 13.0 - 17.0 g/dL 78.2 42.3 12.5(L)  Hematocrit 39.0 - 52.0 % 41.5 45.9 37.1(L)  Platelets 150 - 400 K/uL 194 183 109(L)    ABG    Component Value Date/Time   PHART 7.345 (L) 11/29/2020 1019   PCO2ART 41.3 11/29/2020 1019   PO2ART 105 11/29/2020 1019   HCO3 22.5 11/29/2020 1019   TCO2 24 11/29/2020 1019   ACIDBASEDEF 3.0 (H) 11/29/2020 1019   O2SAT 98.0 11/29/2020 1019    CBG (last 3)  Recent Labs    12/02/20 2022 12/02/20 2337 12/03/20 0427  GLUCAP 160* 130* 133*     Dolan Amen, MD IMTS, PGY-3 Pager: [phone] 12/03/2020,7:16 AM

## 2020-12-03 NOTE — Progress Notes (Signed)
Assisted tele visit to patient with daughter.  Thomas, Tyrah Broers Renee, RN   

## 2020-12-04 ENCOUNTER — Inpatient Hospital Stay (HOSPITAL_COMMUNITY): Payer: Managed Care, Other (non HMO)

## 2020-12-04 DIAGNOSIS — N179 Acute kidney failure, unspecified: Secondary | ICD-10-CM | POA: Diagnosis not present

## 2020-12-04 DIAGNOSIS — R0603 Acute respiratory distress: Secondary | ICD-10-CM | POA: Diagnosis not present

## 2020-12-04 DIAGNOSIS — K852 Alcohol induced acute pancreatitis without necrosis or infection: Secondary | ICD-10-CM | POA: Diagnosis not present

## 2020-12-04 LAB — COMPREHENSIVE METABOLIC PANEL
ALT: 45 U/L — ABNORMAL HIGH (ref 0–44)
AST: 35 U/L (ref 15–41)
Albumin: 1.5 g/dL — ABNORMAL LOW (ref 3.5–5.0)
Alkaline Phosphatase: 58 U/L (ref 38–126)
Anion gap: 6 (ref 5–15)
BUN: 38 mg/dL — ABNORMAL HIGH (ref 6–20)
CO2: 26 mmol/L (ref 22–32)
Calcium: 8.2 mg/dL — ABNORMAL LOW (ref 8.9–10.3)
Chloride: 117 mmol/L — ABNORMAL HIGH (ref 98–111)
Creatinine, Ser: 0.85 mg/dL (ref 0.61–1.24)
GFR, Estimated: >60 mL/min (ref >60)
Glucose, Bld: 181 mg/dL — ABNORMAL HIGH (ref 70–99)
Potassium: 3.6 mmol/L (ref 3.5–5.1)
Sodium: 149 mmol/L — ABNORMAL HIGH (ref 135–145)
Total Bilirubin: 1.5 mg/dL — ABNORMAL HIGH (ref 0.3–1.2)
Total Protein: 4.9 g/dL — ABNORMAL LOW (ref 6.5–8.1)

## 2020-12-04 LAB — CBC
HCT: 35.4 % — ABNORMAL LOW (ref 39.0–52.0)
Hemoglobin: 11.9 g/dL — ABNORMAL LOW (ref 13.0–17.0)
MCH: 37.8 pg — ABNORMAL HIGH (ref 26.0–34.0)
MCHC: 33.6 g/dL (ref 30.0–36.0)
MCV: 112.4 fL — ABNORMAL HIGH (ref 80.0–100.0)
Platelets: 128 10*3/uL — ABNORMAL LOW (ref 150–400)
RBC: 3.15 MIL/uL — ABNORMAL LOW (ref 4.22–5.81)
RDW: 15.5 % (ref 11.5–15.5)
WBC: 9 10*3/uL (ref 4.0–10.5)
nRBC: 0.2 % (ref 0.0–0.2)

## 2020-12-04 LAB — GLUCOSE, CAPILLARY
Glucose-Capillary: 190 mg/dL — ABNORMAL HIGH (ref 70–99)
Glucose-Capillary: 173 mg/dL — ABNORMAL HIGH (ref 70–99)
Glucose-Capillary: 135 mg/dL — ABNORMAL HIGH (ref 70–99)
Glucose-Capillary: 139 mg/dL — ABNORMAL HIGH (ref 70–99)
Glucose-Capillary: 105 mg/dL — ABNORMAL HIGH (ref 70–99)
Glucose-Capillary: 168 mg/dL — ABNORMAL HIGH (ref 70–99)

## 2020-12-04 LAB — BRAIN NATRIURETIC PEPTIDE: B Natriuretic Peptide: 112.8 pg/mL — ABNORMAL HIGH (ref 0.0–100.0)

## 2020-12-04 LAB — MAGNESIUM: Magnesium: 2 mg/dL (ref 1.7–2.4)

## 2020-12-04 MED ORDER — CHLORHEXIDINE GLUCONATE 0.12 % MT SOLN
15.0000 mL | Freq: Two times a day (BID) | OROMUCOSAL | Status: DC
  Administered 2020-12-05 – 2020-12-13 (×8): 15 mL via OROMUCOSAL
  Filled 2020-12-04 (×9): qty 15

## 2020-12-04 MED ORDER — METOPROLOL TARTRATE 5 MG/5ML IV SOLN
5.0000 mg | Freq: Three times a day (TID) | INTRAVENOUS | Status: DC
  Administered 2020-12-04 – 2020-12-05 (×2): 5 mg via INTRAVENOUS
  Filled 2020-12-04 (×2): qty 5

## 2020-12-04 MED ORDER — THIAMINE HCL 100 MG PO TABS: 100.0000 mg | ORAL_TABLET | Freq: Every day | ORAL | Status: DC

## 2020-12-04 MED ORDER — FUROSEMIDE 10 MG/ML IJ SOLN: 40.0000 mg | Freq: Once | INTRAMUSCULAR | Status: DC

## 2020-12-04 MED ORDER — METOLAZONE 2.5 MG PO TABS: 10.0000 mg | ORAL_TABLET | Freq: Once | ORAL | Status: DC

## 2020-12-04 MED ORDER — METOPROLOL TARTRATE 5 MG/5ML IV SOLN
2.5000 mg | Freq: Three times a day (TID) | INTRAVENOUS | Status: DC
  Administered 2020-12-04: 2.5 mg via INTRAVENOUS
  Filled 2020-12-04: qty 5

## 2020-12-04 MED ORDER — ORAL CARE MOUTH RINSE
15.0000 mL | Freq: Two times a day (BID) | OROMUCOSAL | Status: DC
  Administered 2020-12-05 – 2020-12-11 (×13): 15 mL via OROMUCOSAL

## 2020-12-04 MED ORDER — LORAZEPAM 2 MG/ML IJ SOLN
INTRAMUSCULAR | Status: AC
  Administered 2020-12-04: 2 mg via INTRAVENOUS
  Filled 2020-12-04: qty 1

## 2020-12-04 MED ORDER — FOLIC ACID 5 MG/ML IJ SOLN
1.0000 mg | Freq: Every day | INTRAMUSCULAR | Status: DC
  Administered 2020-12-05: 1 mg via INTRAVENOUS
  Filled 2020-12-04: qty 0.2

## 2020-12-04 MED ORDER — POTASSIUM CHLORIDE 10 MEQ/100ML IV SOLN: 10.0000 meq | INTRAVENOUS | Status: DC

## 2020-12-04 MED ORDER — THIAMINE HCL 100 MG/ML IJ SOLN
100.0000 mg | Freq: Every day | INTRAMUSCULAR | Status: DC
  Filled 2020-12-04: qty 2

## 2020-12-04 MED ORDER — LORAZEPAM 1 MG PO TABS
1.0000 mg | ORAL_TABLET | ORAL | Status: AC | PRN
  Administered 2020-12-07: 1 mg via ORAL
  Filled 2020-12-04: qty 1

## 2020-12-04 MED ORDER — FOLIC ACID 1 MG PO TABS: 1.0000 mg | ORAL_TABLET | Freq: Every day | ORAL | Status: DC

## 2020-12-04 MED ORDER — FENTANYL CITRATE (PF) 100 MCG/2ML IJ SOLN
25.0000 ug | INTRAMUSCULAR | Status: DC | PRN
  Administered 2020-12-04 – 2020-12-10 (×10): 25 ug via INTRAVENOUS
  Filled 2020-12-04 (×10): qty 2

## 2020-12-04 MED ORDER — FUROSEMIDE 10 MG/ML IJ SOLN
40.0000 mg | Freq: Once | INTRAMUSCULAR | Status: AC
  Administered 2020-12-04: 40 mg via INTRAVENOUS
  Filled 2020-12-04: qty 4

## 2020-12-04 MED ORDER — HALOPERIDOL LACTATE 5 MG/ML IJ SOLN
5.0000 mg | Freq: Four times a day (QID) | INTRAMUSCULAR | Status: DC | PRN
  Administered 2020-12-07 – 2020-12-09 (×3): 5 mg via INTRAVENOUS
  Filled 2020-12-04 (×4): qty 1

## 2020-12-04 MED ORDER — SODIUM CHLORIDE 0.9 % IV BOLUS
250.0000 mL | Freq: Once | INTRAVENOUS | Status: AC
  Administered 2020-12-04: 250 mL via INTRAVENOUS

## 2020-12-04 MED ORDER — FUROSEMIDE 10 MG/ML IJ SOLN: 20.0000 mg | Freq: Once | INTRAMUSCULAR | Status: DC

## 2020-12-04 MED ORDER — ADULT MULTIVITAMIN W/MINERALS CH: 1.0000 | ORAL_TABLET | Freq: Every day | ORAL | Status: DC

## 2020-12-04 MED ORDER — POTASSIUM CHLORIDE 10 MEQ/100ML IV SOLN
10.0000 meq | INTRAVENOUS | Status: DC
  Administered 2020-12-04: 10 meq via INTRAVENOUS
  Filled 2020-12-04: qty 100

## 2020-12-04 MED ORDER — LORAZEPAM 2 MG/ML IJ SOLN
0.5000 mg | Freq: Two times a day (BID) | INTRAMUSCULAR | Status: DC | PRN
  Administered 2020-12-04: 0.5 mg via INTRAVENOUS
  Filled 2020-12-04: qty 1

## 2020-12-04 MED ORDER — THIAMINE HCL 100 MG/ML IJ SOLN: 100.0000 mg | Freq: Every day | INTRAMUSCULAR | Status: DC

## 2020-12-04 MED ORDER — HALOPERIDOL LACTATE 5 MG/ML IJ SOLN
INTRAMUSCULAR | Status: AC
  Administered 2020-12-04: 5 mg via INTRAVENOUS
  Filled 2020-12-04: qty 1

## 2020-12-04 MED ORDER — POTASSIUM CHLORIDE 10 MEQ/50ML IV SOLN
10.0000 meq | INTRAVENOUS | Status: DC
Start: 2020-12-04 — End: 2020-12-04
  Administered 2020-12-04: 10 meq via INTRAVENOUS
  Filled 2020-12-04: qty 50

## 2020-12-04 MED ORDER — LORAZEPAM 2 MG/ML IJ SOLN
1.0000 mg | INTRAMUSCULAR | Status: AC | PRN
  Administered 2020-12-05: 1 mg via INTRAVENOUS
  Administered 2020-12-05: 2 mg via INTRAVENOUS
  Administered 2020-12-06 (×2): 1 mg via INTRAVENOUS
  Filled 2020-12-04 (×4): qty 1

## 2020-12-04 MED ORDER — POTASSIUM CHLORIDE 10 MEQ/100ML IV SOLN
10.0000 meq | INTRAVENOUS | Status: AC
Start: 2020-12-04 — End: 2020-12-04
  Administered 2020-12-04 (×3): 10 meq via INTRAVENOUS
  Filled 2020-12-04 (×3): qty 100

## 2020-12-04 NOTE — Progress Notes (Signed)
Subjective: CC: Awake on the vent this morning. Reports no abdominal pain. NGT w/ 1.85L in the last 24 hours. Appears the cannister was just changed so unclear what the output of contents looked like. He reports he is passing flatus. Xray this am pending. Wife is at bedside.  Objective: Vital signs in last 24 hours: Temp:  [97.4 F (36.3 C)-103.2 F (39.6 C)] 97.5 F (36.4 C) (08/23 0712) Pulse Rate:  [61-113] 85 (08/23 0751) Resp:  [11-28] 16 (08/23 0751) BP: (79-153)/(55-99) 124/85 (08/23 0600) SpO2:  [91 %-97 %] 97 % (08/23 0751) FiO2 (%):  [40 %-60 %] 40 % (08/23 0751) Weight:  [126.7 kg] 126.7 kg (08/23 0333) Last BM Date: 11/26/20  Intake/Output from previous day: 08/22 0701 - 08/23 0700 In: 2711.9 [I.V.:1861.9; NG/GT:100; IV Piggyback:750] Out: 4520 [Urine:2670; Emesis/NG output:1850] Intake/Output this shift: No intake/output data recorded.   Vent Mode: CPAP;PSV FiO2 (%):  [40 %-60 %] 40 % Set Rate:  [22 bmp] 22 bmp Vt Set:  [580 mL] 580 mL PEEP:  [8 cmH20] 8 cmH20 Pressure Support:  [5 cmH20] 5 cmH20 Plateau Pressure:  [21 cmH20] 21 cmH20   PE: Gen:  Awake and alert, on vent Card:  RRR Pulm:  On vent w/ settings above Abd: Soft, mildly distended, NT, no guarding or rigidity, hypoactive bowel sounds. OGT in place.  GU: Straw colored urine  Lab Results:  Recent Labs    12/03/20 0113 12/04/20 0358  WBC 15.4* 9.0  HGB 13.6 11.9*  HCT 41.5 35.4*  PLT 194 128*   BMET Recent Labs    12/03/20 0113 12/04/20 0559  NA 148* 149*  K 4.0 3.6  CL 113* 117*  CO2 21* 26  GLUCOSE 151* 181*  BUN 58* 38*  CREATININE 2.22* 0.85  CALCIUM 8.0* 8.2*   PT/INR No results for input(s): LABPROT, INR in the last 72 hours. CMP     Component Value Date/Time   NA 149 (H) 12/04/2020 0559   K 3.6 12/04/2020 0559   CL 117 (H) 12/04/2020 0559   CO2 26 12/04/2020 0559   GLUCOSE 181 (H) 12/04/2020 0559   BUN 38 (H) 12/04/2020 0559   CREATININE 0.85 12/04/2020  0559   CALCIUM 8.2 (L) 12/04/2020 0559   PROT 4.9 (L) 12/04/2020 0559   ALBUMIN 1.5 (L) 12/04/2020 0559   AST 35 12/04/2020 0559   ALT 45 (H) 12/04/2020 0559   ALKPHOS 58 12/04/2020 0559   BILITOT 1.5 (H) 12/04/2020 0559   GFRNONAA >60 12/04/2020 0559   Lipase     Component Value Date/Time   LIPASE 507 (H) 11/27/2020 0208    Studies/Results: CT ABDOMEN PELVIS WO CONTRAST  Result Date: 12/02/2020 CLINICAL DATA:  Bowel obstruction suspected Pancreatitis, persistent EXAM: CT ABDOMEN AND PELVIS WITHOUT CONTRAST TECHNIQUE: Multidetector CT imaging of the abdomen and pelvis was performed following the standard protocol without IV contrast. COMPARISON:  11/26/2020 FINDINGS: Lower chest: Bilateral lower lobe airspace opacities. Small left pleural effusion. Hepatobiliary: Prior cholecystectomy. Diffuse fatty infiltration of the liver. No focal hepatic abnormality. Pancreas: Inflammation/stranding around the pancreas, slightly increased since prior study compatible with acute pancreatitis. No ductal dilatation. Spleen: No focal abnormality.  Normal size. Adrenals/Urinary Tract: No adrenal abnormality. No focal renal abnormality. No stones or hydronephrosis. Urinary bladder is unremarkable. Foley catheter within the bladder. Stomach/Bowel: Stomach and large bowel unremarkable. Small bowel is dilated into the pelvis. Distal small bowel is decompressed. Findings compatible with distal small bowel obstruction. Exact transition  not visualized. Vascular/Lymphatic: No evidence of aneurysm or adenopathy. Reproductive: No visible focal abnormality. Other: Small amount of free fluid in the abdomen and pelvis. Musculoskeletal: No acute bony abnormality. IMPRESSION: Dilated small bowel loops into the pelvis with decompressed distal small bowel. Findings compatible with distal small bowel obstruction. Changes of acute pancreatitis, slightly worsening since prior study. Hepatic steatosis. Bilateral airspace  opacity/consolidation. This could reflect atelectasis or pneumonia. Trace left effusion. Small amount of free fluid in the abdomen and pelvis. Electronically Signed   By: Charlett Nose M.D.   On: 12/02/2020 15:00   DG Chest Port 1 View  Result Date: 12/03/2020 CLINICAL DATA:  Hypertension, respiratory failure, nephrolithiasis EXAM: PORTABLE CHEST - 1 VIEW COMPARISON:  the previous day's study FINDINGS: Endotracheal tube and nasogastric tube remain in place. Relatively low lung volumes with infrahilar consolidation/atelectasis, left greater than right as before. Heart size and mediastinal contours are within normal limits. No effusion. No definite pneumothorax although the apices are partially excluded. Visualized bones unremarkable.  Cholecystectomy clips. IMPRESSION: Stable infrahilar consolidation/atelectasis, left greater than right. Support hardware stable in position. Electronically Signed   By: Corlis Leak M.D.   On: 12/03/2020 07:43   DG Abd Portable 1V  Result Date: 12/04/2020 CLINICAL DATA:  Small boxer per EXAM: PORTABLE ABDOMEN - 1 VIEW COMPARISON:  Mold KUB, most recently 12/03/2020. CT pelvis, 12/02/2020. FINDINGS: Support lines: Nasogastric tube, with tip unchanged and projecting within small bowel status post gastric bypass. Foley catheter, with temperature sensing probe. Similar appearance of multiple air-and-fluid distended central small bowel loops, greatest at the RIGHT lower quadrant and measuring up to 10.0 cm. Cholecystectomy clips. Surgical clips additional staple line in surgical clips at the epigastrium, consistent with known gastric bypass. No osseous abnormality. IMPRESSION: 1. Lines and tubes are unchanged in position. 2. Persistently dilated bowel loops, consistent with known bowel obstruction. Electronically Signed   By: Roanna Banning M.D.   On: 12/04/2020 08:09   DG Abd Portable 1V-Small Bowel Obstruction Protocol-initial, 8 hr delay  Result Date: 12/03/2020 CLINICAL DATA:   Small-bowel obstruction EXAM: PORTABLE ABDOMEN - 1 VIEW COMPARISON:  Same day abdominal radiographs, CT abdomen/pelvis 1 day prior FINDINGS: The enteric catheter tip projects over the mid abdomen likely in the small bowel status post gastric bypass. Multiple dilated loops of bowel are again seen throughout the abdomen, the largest in the right lower quadrant measuring up to 10.0 cm, overall unchanged since the radiographs obtained earlier the same day. IMPRESSION: No significant interval change compared to the KUB obtained earlier the same day. Electronically Signed   By: Lesia Hausen M.D.   On: 12/03/2020 19:35   DG Abd Portable 1V  Result Date: 12/03/2020 CLINICAL DATA:  Respiratory failure. SBO. Best obtainable images EXAM: PORTABLE ABDOMEN - 1 VIEW COMPARISON:  CT from previous day FINDINGS: Nasogastric tube into partially decompressed stomach. Several dilated gas-filled loops of small bowel predominantly in the left mid abdomen. Gas distended cecum in the right mid abdomen as before. The remainder of the colon is decompressed. Cholecystectomy clips. Surgical staples in the left upper and mid abdomen. No abnormal abdominal calcifications. IMPRESSION: Little convincing interval change in bowel gas pattern since previous CT, suggesting partial small bowel obstruction. Electronically Signed   By: Corlis Leak M.D.   On: 12/03/2020 07:49    Anti-infectives: Anti-infectives (From admission, onward)    Start     Dose/Rate Route Frequency Ordered Stop   12/03/20 1115  cefTRIAXone (ROCEPHIN) 2 g in sodium chloride  0.9 % 100 mL IVPB        2 g 200 mL/hr over 30 Minutes Intravenous Every 24 hours 12/03/20 1016     12/02/20 1500  linezolid (ZYVOX) IVPB 600 mg        600 mg 300 mL/hr over 60 Minutes Intravenous Every 12 hours 12/02/20 1407     12/02/20 1028  vancomycin variable dose per unstable renal function (pharmacist dosing)  Status:  Discontinued         Does not apply See admin instructions 12/02/20  1028 12/02/20 1407   12/02/20 0000  vancomycin (VANCOREADY) IVPB 1500 mg/300 mL  Status:  Discontinued        1,500 mg 150 mL/hr over 120 Minutes Intravenous Every 12 hours 12/01/20 1126 12/02/20 1028   12/01/20 1215  vancomycin (VANCOREADY) IVPB 2000 mg/400 mL        2,000 mg 200 mL/hr over 120 Minutes Intravenous  Once 12/01/20 1123 12/01/20 1433   11/30/20 1200  Ampicillin-Sulbactam (UNASYN) 3 g in sodium chloride 0.9 % 100 mL IVPB  Status:  Discontinued        3 g 200 mL/hr over 30 Minutes Intravenous Every 6 hours 11/30/20 1055 12/02/20 1522        Assessment/Plan 50 year old male with alcoholic pancreatitis SBO vs Ileus from above - 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 No peritonitis on exam. No indication for emergency surgery - Undergoing sbo protocol. Xray pending this am - Keep K > 4 and Mg > 2 for bowel function - We will follow with you. Appreciate CCM's assistance   FEN - NPO, OGT, IVF per CCM VTE - SCDs, Lovenox ID - On Linezolid per CCM for below. Foley - in place   Alcohol Pancreatitis  Elevated LFT's - improving VDRF Aspiration pneumonitis with Staph aureus in sputum - on abx  Alcohol withdrawal - on precedex, thiamine and folic acid Acute metabolic encephalopathy HTN CHF L rib fx's   LOS: 8 days    Jacinto Halim , [facility] 12/04/2020, 8:23 AM Please see Amion for pager number during day hours 7:00am-4:30pm

## 2020-12-04 NOTE — Procedures (Signed)
Extubation Procedure Note  Patient Details:   Name: Ernest Rodgers DOB: Jan 28, 1971 MRN: [id number]   Airway Documentation:    Vent end date: 12/04/20 Vent end time: 0835   Evaluation  O2 sats: stable throughout Complications: No apparent complications Patient did tolerate procedure well. Bilateral Breath Sounds: Clear, Diminished   Yes  Toula Moos 12/04/2020, 8:42 AM

## 2020-12-04 NOTE — Hospital Course (Addendum)
Ernest Rodgers is a 50 y.o. male who presented with acute EtOH pancreatitis and was briefly intubated in the ICU secondary to hypoxia and was subsequently extubated on 8/23.  He developed altered mental status and severe hallucinations and agitation after extubation. He was also found to have aspiration pneumonitis for which he completed a course of linezolid and CTX in addition to SBO that was therapeutically treated.  Splenic hematoma was found that was embolized by IR on 8/25 caused by either previous fall injury versus pancreatitis.  PMH is siginificant for alcohol use disorder, hypertension, GERD.  Hospital course outlined below.  EtOH pancreatitis Originally the patient woke up with epigastric pain and radiation to the back he was given Dilaudid, Zofran, and a normal saline bolus in the emergency room.  CT scan of the abdomen showed pancreatic necrosis possible with evidence of pancreatitis. They continued with aggressive fluid hydration, Dilaudid for pain, Zofran for nausea and vomiting.  Patient received supportive care throughout his stay in the ICU and when he was brought back to the floor.  Recovery from pancreatitis had multiple complications.   Alchohol Withdrawal  Patient originally drink 2 cocktails a night with 6-7 drinks on his days off of work.  While on the floor he did have tremors. Patient has history of delirium and hallucinations onset while in the ICU while most likely is multifactorial, it definitely is contributed to by patient's alcohol use.  CIWA scores were taken throughout the admission.  He received Ativan when he needed for withdrawal symptoms.  CIWA scores were discontinued on 12/12/2020 when patient consistently scored 0 on CIWA in was outside of the window for withdrawal.  Likely will follow-up for alcohol use disorder and discharged with naltrexone.  Acute encephalopathy  Flash Pulmonary Edema Patient was evaluated with rapid RN on 8/18.  Patient was responsive to painful  stimuli and it was suspected that he had encephalopathy due to worsening alcohol withdrawal as he had acutely rising CIWA scores overnight.  There was suspect of flash pulmonary edema that was a complication of his pancreatitis and underlying diastolic dysfunction with an element of hepatic congestion CCM took over the patient and he was admitted to ICU.  The patient was subsequently intubated on 11/29/2020-12/03/2020. During intubation, he was found to have a SBO.   ICU course was complicated by development of aspiration PNA and later by development of SBO. Surgery evaluated the patient and decided that he had adequate bowel movements, did not continue with any surgical intervention at that time for SBO. Patient was also found to have a splenic hematoma while in the ICU and had embolization of the spleen via IR (discussed below).    Aspiration pneumonitis Patient had presence of MRSA on sputum culture.  He has completed a course of linezolid and CTX.  No new symptoms at this time. SPO2 has been in the upper 90s on room air.  Splenic hematoma acute blood loss anemia Patient is s/p embolization by IR performed on 8/25.  Patient has since had hemoglobin 7.4 > 7.6 > 7.8 > 8.7 and has been stable.  Blood smear was obtained and showed microcytic anemia. He received meningococcal and Hib vaccines in hospital.  He still needs to get Shingrix and PCV 20 outpatient after leaving the hospital.    Ensure he has follow up for vaccines for asplenia (PCV 20 and Shingrix) Consider starting Naltrexone on discharge to help with alcohol cessation. Naltrexone should not be used with opioids.  Follow up with PCP  for alcohol use disorder treatment

## 2020-12-04 NOTE — Progress Notes (Signed)
Pt requesting to leave AMA. He is very agitated/anxious, Electrical engineer. He is alert and oriented x3, however he is confused and having hallucinations.. He keeps seeing his wife walk by the room(she is not here) and he keeps asking about a skull on the wall(there is not a skull on the wall). [facility] notified and request made for ground team to come assess pt. Will continue to monitor.

## 2020-12-04 NOTE — Progress Notes (Signed)
Dr. Ardeth Perfect came to beside to assess pt. Pt given 4mg  ativan and 5mg  haldol IV per MD order. CIWA protocol also ordered as well as haldol PRN. Pt now resting. M/S tx cancelled at this time. Wife, AJ, called and updated. Will continue to monitor pt closely.

## 2020-12-04 NOTE — Significant Event (Addendum)
OVERNIGHT COVERAGE CRITICAL CARE PROGRESS NOTE  CTSP re: patient threatening to leave AMA.  By his own admission, he is too weak to even pick up a cup of water.  Upon entry into the room, the patient demonstrates paranoid delusions.  He is oriented to time, person, place and situation.  However, he thinks that the nurses have been shooting water guns at him.  He is also intermittently hallucinating (thinking there are people moving in and out of the room even when the door is closed).  He also believes that his sister is sitting outside the room waiting for him (she is nowhere to be seen).  He has facial flusihgn.  He is tremulous.  Tachycardic.  Review of the record demonstrates that the patient has had a drastic reduction in Ativan administration (per orders) for EtOH withdrawal today.  Ativan 4 mg IV single dose now. Restart CIWA protocol with Ativan coverage. Haldol 5 mg IV single dose now.  Critical care time: 30 minutes.  The treatment and management of the patient's condition was required based on the threat of imminent deterioration. This time reflects time spent by the physician evaluating, providing care and managing the critically ill patient's care. The time was spent at the immediate bedside (or on the same floor/unit and dedicated to this patient's care). Time involved in separately billable procedures is NOT included int he critical care time indicated above. Family meeting and update time may be included above if and only if the patient is unable/incompetent to participate in clinical interview and/or decision making, and the discussion was necessary to determining treatment decisions.  Marcelle Smiling, MD Board Certified by the ABIM, Pulmonary Diseases & Critical Care Medicine

## 2020-12-04 NOTE — Progress Notes (Signed)
NAME:  Gurman Ashland, MRN:  [id number], DOB:  1970-09-18, LOS: 8 ADMISSION DATE:  11/26/2020, CONSULTATION DATE:  11/29/20 REFERRING MD:  Rachael Darby - FPTS, CHIEF COMPLAINT:  Unresponsive and Hypoxic    History of Present Illness:  50 yo male former smoker presented with epigastric pain.  He fell prior to admission and found to have Lt 7th rib fracture.  Found to have acute pancreatitis.  Started on CIWA for ETOH withdrawal.  Developed altered mental status with hypoxia on 8/18 and transferred to ICU.  He works as a travel Engineer, civil (consulting).  Pertinent  Medical History  ETOH, s/p Roux en Y, Diastolic CHF, HTN, Depression, s/p cholecystectomy  Significant Hospital Events: Including procedures, antibiotic start and stop dates in addition to other pertinent events   8/15 admitted to [facility] for acute pancreatitis  8/16 EtOH withdrawal sx  8/17 incr CIWA scores  8/18 found to be unresponsive on floor and hypoxic. moving to ICU, intubated  8/20 ileus versus SBO,  8/21 General Surgery consulted for SBO  Interim History / Subjective:  Awake and alert this AM, able to answer questions with head shakes/nods and hand gestures, reports no pain, nausea, fevers this AM. Confirms that cool extremities are his baseline when asked.   Objective   Blood pressure 124/85, pulse 62, temperature (!) 97.5 F (36.4 C), temperature source Core, resp. rate (!) 24, height 5\' 10"  (1.778 m), weight 126.7 kg, SpO2 97 %.    Vent Mode: PRVC FiO2 (%):  [50 %-60 %] 50 % Set Rate:  [22 bmp] 22 bmp Vt Set:  [580 mL] 580 mL PEEP:  [8 cmH20] 8 cmH20 Plateau Pressure:  [20 cmH20-21 cmH20] 21 cmH20   Intake/Output Summary (Last 24 hours) at 12/04/2020 0713 Last data filed at 12/04/2020 12/06/2020 Gross per 24 hour  Intake 2711.87 ml  Output 4520 ml  Net -1808.13 ml    Filed Weights   12/02/20 0500 12/03/20 0500 12/04/20 0333  Weight: 124.3 kg 121.9 kg 126.7 kg    Examination: Physical Exam Constitutional:      General: He is not in  acute distress.    Comments: Awake, on vent but able to answer questions appropriately with hand gestures, NAD.    Cardiovascular:     Rate and Rhythm: Normal rate and regular rhythm.     Pulses: Normal pulses.     Heart sounds: Normal heart sounds. No murmur heard.   No friction rub. No gallop.  Pulmonary:     Comments: Coarse breath sounds bilaterally Abdominal:     General: Bowel sounds are decreased. There is distension.     Comments: Abdomen distended mildly improved from yesterday, decreased BS  Musculoskeletal:     Comments: Trace pitting edema in the lower extremities bilaterally  Skin:    Comments: Cool lower extremities bilaterally  Neurological:     Comments: Moving all extremities spontaneously      Resolved Hospital Problem list     Assessment & Plan:   Acute hypoxic respiratory failure 2nd to aspiration pneumonitis in setting of acute pancreatitis requiring MV:  Awake and aware this morning this AM, able to answer questions appropriately with head nods/shakes and hand gestures.  - Continue SBT/SAT as tolerated, if he tolerates will extubate today.   - Goal SpO2 > 92%  Acute Alcoholic Pancreatitis Small Bowel Obstruction v Ileus Fatty liver: Repeat xray this AM shows no notable change from prior.  - Surgery following, appreciate their assistance.  - NPO - Serial abd xray  -  OG tube to suction - Correct electrolyte abnormalities as they present for goals of K >4 and Mg >2  - K of 3.6 this AM, will replete IV  - Trend Magnesium and CMP  Aspiration Pneumonia with MRSA:  No febrile events overnight, leukocytosis is resolved.   - Continue Linezolid  - Continue Ceftriaxone  - Trend CBC - prn IV acetaminophen for fever  Acute metabolic encephalopathy from hypoxia, pancreatitis. Acute Alcohol Withdrawal. Hx of Depression. LFTs and bili continue to downtrend - PRN Ativan for CIWA > 8  - Continue thiamine and folic acid - VAP Protocol  - Holding home  fluoxetine   Hx of HTN, Chronic diastolic CHF: - Lasix 40 mg IV once - Monitor hemodynamics - Goal K >4 and Mg >2, replete as needed  AKI in the Setting Critical Illness: sCr improved, 0.85 - Trend CMP - Monitor I&O's - Avoid nephrotoxic medications  Acute Non-Displaced L Rib Fractures: - PRN Fentanyl    Hyperglycemia:  - SSI   Hypernatremia:  Na of 149<148. Patient is alert and oriented will continue to trend.  - Trend BMP Best Practice (right click and "Reselect all SmartList Selections" daily)   Diet/type: NPO DVT prophylaxis: LMWH GI prophylaxis: PPI Code Status:  full code Last date of multidisciplinary goals of care discussion: updated family at bedside on 8/21  Labs    CMP Latest Ref Rng & Units 12/04/2020 12/03/2020 12/02/2020  Glucose 70 - 99 mg/dL 782(N) 562(Z) 308(M)  BUN 6 - 20 mg/dL 57(Q) 46(N) 62(X)  Creatinine 0.61 - 1.24 mg/dL 5.28 4.13(K) 4.40(N)  Sodium 135 - 145 mmol/L 149(H) 148(H) 148(H)  Potassium 3.5 - 5.1 mmol/L 3.6 4.0 4.3  Chloride 98 - 111 mmol/L 117(H) 113(H) 112(H)  CO2 22 - 32 mmol/L 26 21(L) 26  Calcium 8.9 - 10.3 mg/dL 8.2(L) 8.0(L) 8.7(L)  Total Protein 6.5 - 8.1 g/dL 4.9(L) 5.3(L) -  Total Bilirubin 0.3 - 1.2 mg/dL 0.2(V) 2.5(D) -  Alkaline Phos 38 - 126 U/L 58 63 -  AST 15 - 41 U/L 35 48(H) -  ALT 0 - 44 U/L 45(H) 63(H) -    CBC Latest Ref Rng & Units 12/04/2020 12/03/2020 12/02/2020  WBC 4.0 - 10.5 K/uL 9.0 15.4(H) 14.1(H)  Hemoglobin 13.0 - 17.0 g/dL 11.9(L) 13.6 15.1  Hematocrit 39.0 - 52.0 % 35.4(L) 41.5 45.9  Platelets 150 - 400 K/uL 128(L) 194 183    ABG    Component Value Date/Time   PHART 7.345 (L) 11/29/2020 1019   PCO2ART 41.3 11/29/2020 1019   PO2ART 105 11/29/2020 1019   HCO3 22.5 11/29/2020 1019   TCO2 24 11/29/2020 1019   ACIDBASEDEF 3.0 (H) 11/29/2020 1019   O2SAT 98.0 11/29/2020 1019    CBG (last 3)  Recent Labs    12/03/20 1914 12/03/20 2328 12/04/20 0324  GLUCAP 179* 245* 190*   Dolan Amen,  MD IMTS, PGY-3 Pager: [phone] 12/04/2020,7:13 AM

## 2020-12-05 ENCOUNTER — Inpatient Hospital Stay (HOSPITAL_COMMUNITY): Payer: Managed Care, Other (non HMO)

## 2020-12-05 ENCOUNTER — Inpatient Hospital Stay: Payer: Self-pay

## 2020-12-05 DIAGNOSIS — N179 Acute kidney failure, unspecified: Secondary | ICD-10-CM | POA: Diagnosis not present

## 2020-12-05 DIAGNOSIS — K852 Alcohol induced acute pancreatitis without necrosis or infection: Secondary | ICD-10-CM | POA: Diagnosis not present

## 2020-12-05 DIAGNOSIS — R0603 Acute respiratory distress: Secondary | ICD-10-CM | POA: Diagnosis not present

## 2020-12-05 LAB — GLUCOSE, CAPILLARY
Glucose-Capillary: 178 mg/dL — ABNORMAL HIGH (ref 70–99)
Glucose-Capillary: 138 mg/dL — ABNORMAL HIGH (ref 70–99)
Glucose-Capillary: 141 mg/dL — ABNORMAL HIGH (ref 70–99)
Glucose-Capillary: 174 mg/dL — ABNORMAL HIGH (ref 70–99)
Glucose-Capillary: 175 mg/dL — ABNORMAL HIGH (ref 70–99)
Glucose-Capillary: 174 mg/dL — ABNORMAL HIGH (ref 70–99)

## 2020-12-05 LAB — CBC
HCT: 31.9 % — ABNORMAL LOW (ref 39.0–52.0)
HCT: 27.4 % — ABNORMAL LOW (ref 39.0–52.0)
HCT: 22.7 % — ABNORMAL LOW (ref 39.0–52.0)
Hemoglobin: 10.8 g/dL — ABNORMAL LOW (ref 13.0–17.0)
Hemoglobin: 8.7 g/dL — ABNORMAL LOW (ref 13.0–17.0)
Hemoglobin: 7.4 g/dL — ABNORMAL LOW (ref 13.0–17.0)
MCH: 37.6 pg — ABNORMAL HIGH (ref 26.0–34.0)
MCH: 37.2 pg — ABNORMAL HIGH (ref 26.0–34.0)
MCH: 37.8 pg — ABNORMAL HIGH (ref 26.0–34.0)
MCHC: 33.9 g/dL (ref 30.0–36.0)
MCHC: 31.8 g/dL (ref 30.0–36.0)
MCHC: 32.6 g/dL (ref 30.0–36.0)
MCV: 111.1 fL — ABNORMAL HIGH (ref 80.0–100.0)
MCV: 117.1 fL — ABNORMAL HIGH (ref 80.0–100.0)
MCV: 115.8 fL — ABNORMAL HIGH (ref 80.0–100.0)
Platelets: 218 10*3/uL (ref 150–400)
Platelets: 209 10*3/uL (ref 150–400)
Platelets: 220 10*3/uL (ref 150–400)
RBC: 2.87 MIL/uL — ABNORMAL LOW (ref 4.22–5.81)
RBC: 2.34 MIL/uL — ABNORMAL LOW (ref 4.22–5.81)
RBC: 1.96 MIL/uL — ABNORMAL LOW (ref 4.22–5.81)
RDW: 15.3 % (ref 11.5–15.5)
RDW: 15.7 % — ABNORMAL HIGH (ref 11.5–15.5)
RDW: 15.7 % — ABNORMAL HIGH (ref 11.5–15.5)
WBC: 15.1 10*3/uL — ABNORMAL HIGH (ref 4.0–10.5)
WBC: 14.8 10*3/uL — ABNORMAL HIGH (ref 4.0–10.5)
WBC: 13.4 10*3/uL — ABNORMAL HIGH (ref 4.0–10.5)
nRBC: 0 % (ref 0.0–0.2)
nRBC: 0 % (ref 0.0–0.2)
nRBC: 0 % (ref 0.0–0.2)

## 2020-12-05 LAB — POCT I-STAT 7, (LYTES, BLD GAS, ICA,H+H)
Acid-base deficit: 1 mmol/L (ref 0.0–2.0)
Bicarbonate: 19.7 mmol/L — ABNORMAL LOW (ref 20.0–28.0)
Calcium, Ion: 1.16 mmol/L (ref 1.15–1.40)
HCT: 27 % — ABNORMAL LOW (ref 39.0–52.0)
Hemoglobin: 9.2 g/dL — ABNORMAL LOW (ref 13.0–17.0)
O2 Saturation: 97 %
Patient temperature: 38.3
Potassium: 3.4 mmol/L — ABNORMAL LOW (ref 3.5–5.1)
Sodium: 154 mmol/L — ABNORMAL HIGH (ref 135–145)
TCO2: 20 mmol/L — ABNORMAL LOW (ref 22–32)
pCO2 arterial: 21.3 mmHg — ABNORMAL LOW (ref 32.0–48.0)
pH, Arterial: 7.579 — ABNORMAL HIGH (ref 7.350–7.450)
pO2, Arterial: 80 mmHg — ABNORMAL LOW (ref 83.0–108.0)

## 2020-12-05 LAB — COMPREHENSIVE METABOLIC PANEL
ALT: 41 U/L (ref 0–44)
AST: 37 U/L (ref 15–41)
Albumin: 1.7 g/dL — ABNORMAL LOW (ref 3.5–5.0)
Alkaline Phosphatase: 64 U/L (ref 38–126)
Anion gap: 9 (ref 5–15)
BUN: 25 mg/dL — ABNORMAL HIGH (ref 6–20)
CO2: 23 mmol/L (ref 22–32)
Calcium: 8.3 mg/dL — ABNORMAL LOW (ref 8.9–10.3)
Chloride: 119 mmol/L — ABNORMAL HIGH (ref 98–111)
Creatinine, Ser: 0.92 mg/dL (ref 0.61–1.24)
GFR, Estimated: >60 mL/min (ref >60)
Glucose, Bld: 160 mg/dL — ABNORMAL HIGH (ref 70–99)
Potassium: 3.3 mmol/L — ABNORMAL LOW (ref 3.5–5.1)
Sodium: 151 mmol/L — ABNORMAL HIGH (ref 135–145)
Total Bilirubin: 1.6 mg/dL — ABNORMAL HIGH (ref 0.3–1.2)
Total Protein: 4.9 g/dL — ABNORMAL LOW (ref 6.5–8.1)

## 2020-12-05 LAB — PROTIME-INR
INR: 1.5 — ABNORMAL HIGH (ref 0.8–1.2)
Prothrombin Time: 17.7 seconds — ABNORMAL HIGH (ref 11.4–15.2)

## 2020-12-05 LAB — ABO/RH: ABO/RH(D): O NEG

## 2020-12-05 LAB — MAGNESIUM: Magnesium: 1.9 mg/dL (ref 1.7–2.4)

## 2020-12-05 LAB — PHOSPHORUS: Phosphorus: 3.5 mg/dL (ref 2.5–4.6)

## 2020-12-05 MED ORDER — ZINC CHLORIDE 1 MG/ML IV SOLN
INTRAVENOUS | Status: AC
  Filled 2020-12-05: qty 556.8

## 2020-12-05 MED ORDER — IOHEXOL 350 MG/ML SOLN
100.0000 mL | Freq: Once | INTRAVENOUS | Status: AC | PRN
  Administered 2020-12-05: 100 mL via INTRAVENOUS

## 2020-12-05 MED ORDER — SODIUM CHLORIDE 0.9 % IV SOLN
1.0000 mg | Freq: Once | INTRAVENOUS | Status: AC
  Administered 2020-12-05: 1 mg via INTRAVENOUS
  Filled 2020-12-05: qty 0.2

## 2020-12-05 MED ORDER — METOPROLOL TARTRATE 5 MG/5ML IV SOLN
5.0000 mg | Freq: Four times a day (QID) | INTRAVENOUS | Status: DC
  Administered 2020-12-05 – 2020-12-08 (×10): 5 mg via INTRAVENOUS
  Filled 2020-12-05 (×10): qty 5

## 2020-12-05 MED ORDER — INSULIN ASPART 100 UNIT/ML IJ SOLN
0.0000 [IU] | INTRAMUSCULAR | Status: DC
  Administered 2020-12-05 – 2020-12-06 (×3): 3 [IU] via SUBCUTANEOUS
  Administered 2020-12-06: 5 [IU] via SUBCUTANEOUS

## 2020-12-05 MED ORDER — FOLIC ACID 5 MG/ML IJ SOLN: 1.0000 mg | Freq: Every day | INTRAMUSCULAR | Status: DC

## 2020-12-05 MED ORDER — LABETALOL HCL 5 MG/ML IV SOLN
20.0000 mg | Freq: Once | INTRAVENOUS | Status: AC
  Administered 2020-12-05: 20 mg via INTRAVENOUS

## 2020-12-05 MED ORDER — MAGNESIUM SULFATE 2 GM/50ML IV SOLN
2.0000 g | Freq: Once | INTRAVENOUS | Status: AC
  Administered 2020-12-05: 2 g via INTRAVENOUS
  Filled 2020-12-05: qty 50

## 2020-12-05 MED ORDER — THIAMINE HCL 100 MG/ML IJ SOLN
100.0000 mg | Freq: Every day | INTRAMUSCULAR | Status: AC
  Administered 2020-12-05: 100 mg via INTRAVENOUS

## 2020-12-05 MED ORDER — ALBUMIN HUMAN 25 % IV SOLN
50.0000 g | Freq: Once | INTRAVENOUS | Status: AC
  Administered 2020-12-05: 50 g via INTRAVENOUS
  Filled 2020-12-05: qty 200

## 2020-12-05 MED ORDER — SODIUM CHLORIDE 0.9% FLUSH
10.0000 mL | INTRAVENOUS | Status: DC | PRN
  Administered 2020-12-06: 30 mL
  Administered 2020-12-07: 10 mL

## 2020-12-05 MED ORDER — LABETALOL HCL 5 MG/ML IV SOLN
INTRAVENOUS | Status: AC
  Filled 2020-12-05: qty 4

## 2020-12-05 MED ORDER — POTASSIUM CHLORIDE 10 MEQ/100ML IV SOLN
10.0000 meq | INTRAVENOUS | Status: AC
  Administered 2020-12-05 (×6): 10 meq via INTRAVENOUS
  Filled 2020-12-05 (×6): qty 100

## 2020-12-05 MED ORDER — SODIUM CHLORIDE 0.9% FLUSH
10.0000 mL | Freq: Two times a day (BID) | INTRAVENOUS | Status: DC
Start: 2020-12-05 — End: 2020-12-13
  Administered 2020-12-05 – 2020-12-08 (×7): 10 mL
  Administered 2020-12-09: 20 mL
  Administered 2020-12-09 – 2020-12-13 (×6): 10 mL

## 2020-12-05 MED ORDER — HYDRALAZINE HCL 20 MG/ML IJ SOLN
10.0000 mg | Freq: Once | INTRAMUSCULAR | Status: AC
  Administered 2020-12-05: 10 mg via INTRAVENOUS
  Filled 2020-12-05: qty 1

## 2020-12-05 MED ORDER — POTASSIUM CL IN DEXTROSE 5% 20 MEQ/L IV SOLN
20.0000 meq | INTRAVENOUS | Status: AC
  Administered 2020-12-05: 20 meq via INTRAVENOUS
  Filled 2020-12-05: qty 1000

## 2020-12-05 NOTE — Progress Notes (Signed)
OT Cancellation Note  Patient Details Name: Ernest Rodgers MRN: [id number] DOB: [birth year]   Cancelled Treatment:    Reason Eval/Treat Not Completed: Patient not medically ready.  Spoke with RN who requested therapies hold today due to findings on CT.  Will check back,  Eber Jones., OTR/L Acute Rehabilitation Services Pager [phone] Office [phone]   Jeani Hawking M 12/05/2020, 1:27 PM

## 2020-12-05 NOTE — Progress Notes (Signed)
Subjective: CC: Notes reviewed from overnight. He is very somnolent this morning but opens eyes and responds appropriately. He denies any abdominal pain or nausea. No emesis yesterday or this morning. Unclear about flatus this am but had 4 episodes of mushy stools yesterday per his wife. He became febrile overnight and is going for CTA chest and CT A/P of the abdomen this am.   Objective: Vital signs in last 24 hours: Temp:  [98 F (36.7 C)-101.3 F (38.5 C)] 99.9 F (37.7 C) (08/24 0800) Pulse Rate:  [97-143] 99 (08/24 0930) Resp:  [16-45] 29 (08/24 0930) BP: (100-185)/(58-163) 102/71 (08/24 0930) SpO2:  [87 %-100 %] 99 % (08/24 0930) Weight:  [122.6 kg] 122.6 kg (08/24 0630) Last BM Date: 12/04/20  Intake/Output from previous day: 08/23 0701 - 08/24 0700 In: 1518.7 [I.V.:61.2; IV Piggyback:1457.5] Out: 1750 [Urine:1750] Intake/Output this shift: Total I/O In: 36.2 [IV Piggyback:36.2] Out: -   PE: Gen:  Somnolent but opens eyes to voice and responds to questioning. Moves all 4 extremities. Card:  Reg Pulm:  Normal rate and effort  Abd: Soft, more distended in the upper abdomen, NT, no guarding or rigidity, hypoactive bowel sounds.   Lab Results:  Recent Labs    12/04/20 0358 12/05/20 0150 12/05/20 0416  WBC 9.0 15.1*  --   HGB 11.9* 10.8* 9.2*  HCT 35.4* 31.9* 27.0*  PLT 128* 218  --    BMET Recent Labs    12/04/20 0559 12/05/20 0150 12/05/20 0416  NA 149* 151* 154*  K 3.6 3.3* 3.4*  CL 117* 119*  --   CO2 26 23  --   GLUCOSE 181* 160*  --   BUN 38* 25*  --   CREATININE 0.85 0.92  --   CALCIUM 8.2* 8.3*  --    PT/INR No results for input(s): LABPROT, INR in the last 72 hours. CMP     Component Value Date/Time   NA 154 (H) 12/05/2020 0416   K 3.4 (L) 12/05/2020 0416   CL 119 (H) 12/05/2020 0150   CO2 23 12/05/2020 0150   GLUCOSE 160 (H) 12/05/2020 0150   BUN 25 (H) 12/05/2020 0150   CREATININE 0.92 12/05/2020 0150   CALCIUM 8.3 (L)  12/05/2020 0150   PROT 4.9 (L) 12/05/2020 0150   ALBUMIN 1.7 (L) 12/05/2020 0150   AST 37 12/05/2020 0150   ALT 41 12/05/2020 0150   ALKPHOS 64 12/05/2020 0150   BILITOT 1.6 (H) 12/05/2020 0150   GFRNONAA >60 12/05/2020 0150   Lipase     Component Value Date/Time   LIPASE 507 (H) 11/27/2020 0208    Studies/Results: DG Chest Port 1 View  Result Date: 12/05/2020 CLINICAL DATA:  50 year old male with history of acute respiratory failure. Pneumonia. EXAM: PORTABLE CHEST 1 VIEW COMPARISON:  Chest x-ray 12/03/2020. FINDINGS: Lung volumes are low. There are some patchy bibasilar opacities in the medial aspect of the left lower lobe and lateral aspect of the right lower lobe. Possible trace right pleural effusion. No definite left pleural effusion. No pneumothorax. No evidence of pulmonary edema. Heart size is within normal limits allowing for low lung volumes and portable AP technique. The patient is rotated to the left on today's exam, resulting in distortion of the mediastinal contours and reduced diagnostic sensitivity and specificity for mediastinal pathology. IMPRESSION: 1. Low lung volumes with bibasilar opacities which may reflect areas of atelectasis and/or consolidation. Possible trace right pleural effusion. Electronically Signed   By: Reuel Boom  Entrikin M.D.   On: 12/05/2020 05:32   DG Abd Portable 1V  Result Date: 12/04/2020 CLINICAL DATA:  Small boxer per EXAM: PORTABLE ABDOMEN - 1 VIEW COMPARISON:  Mold KUB, most recently 12/03/2020. CT pelvis, 12/02/2020. FINDINGS: Support lines: Nasogastric tube, with tip unchanged and projecting within small bowel status post gastric bypass. Foley catheter, with temperature sensing probe. Similar appearance of multiple air-and-fluid distended central small bowel loops, greatest at the RIGHT lower quadrant and measuring up to 10.0 cm. Cholecystectomy clips. Surgical clips additional staple line in surgical clips at the epigastrium, consistent with known  gastric bypass. No osseous abnormality. IMPRESSION: 1. Lines and tubes are unchanged in position. 2. Persistently dilated bowel loops, consistent with known bowel obstruction. Electronically Signed   By: Roanna Banning M.D.   On: 12/04/2020 08:09   DG Abd Portable 1V-Small Bowel Obstruction Protocol-initial, 8 hr delay  Result Date: 12/03/2020 CLINICAL DATA:  Small-bowel obstruction EXAM: PORTABLE ABDOMEN - 1 VIEW COMPARISON:  Same day abdominal radiographs, CT abdomen/pelvis 1 day prior FINDINGS: The enteric catheter tip projects over the mid abdomen likely in the small bowel status post gastric bypass. Multiple dilated loops of bowel are again seen throughout the abdomen, the largest in the right lower quadrant measuring up to 10.0 cm, overall unchanged since the radiographs obtained earlier the same day. IMPRESSION: No significant interval change compared to the KUB obtained earlier the same day. Electronically Signed   By: Lesia Hausen M.D.   On: 12/03/2020 19:35    Anti-infectives: Anti-infectives (From admission, onward)    Start     Dose/Rate Route Frequency Ordered Stop   12/03/20 1115  cefTRIAXone (ROCEPHIN) 2 g in sodium chloride 0.9 % 100 mL IVPB        2 g 200 mL/hr over 30 Minutes Intravenous Every 24 hours 12/03/20 1016 12/09/20 2359   12/02/20 1500  linezolid (ZYVOX) IVPB 600 mg        600 mg 300 mL/hr over 60 Minutes Intravenous Every 12 hours 12/02/20 1407 12/07/20 2359   12/02/20 1028  vancomycin variable dose per unstable renal function (pharmacist dosing)  Status:  Discontinued         Does not apply See admin instructions 12/02/20 1028 12/02/20 1407   12/02/20 0000  vancomycin (VANCOREADY) IVPB 1500 mg/300 mL  Status:  Discontinued        1,500 mg 150 mL/hr over 120 Minutes Intravenous Every 12 hours 12/01/20 1126 12/02/20 1028   12/01/20 1215  vancomycin (VANCOREADY) IVPB 2000 mg/400 mL        2,000 mg 200 mL/hr over 120 Minutes Intravenous  Once 12/01/20 1123 12/01/20 1433    11/30/20 1200  Ampicillin-Sulbactam (UNASYN) 3 g in sodium chloride 0.9 % 100 mL IVPB  Status:  Discontinued        3 g 200 mL/hr over 30 Minutes Intravenous Every 6 hours 11/30/20 1055 12/02/20 1522        Assessment/Plan 50 year old male with alcoholic pancreatitis SBO vs Ileus from above - 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 Will follow up on repeat CT scan this AM. He has no tenderness or peritonitis on exam and started to have ROBF yesterday w/ 4 bm's however appears more distended this am. - Keep K > 4 and Mg > 2 for bowel function - We will follow  with you. Appreciate CCM's assistance   FEN - NPO, IVF per CCM. Consider TPN vs PO nutrition pending CT scan results VTE - SCDs, Lovenox ID - On Linezolid/Rocephin per CCM for below. Foley - in place   Alcohol Pancreatitis  Elevated LFT's - improving VDRF Aspiration pneumonitis with Staph aureus in sputum - on abx  Alcohol withdrawal - on thiamine and folic acid. CIWA. Ativan and Haldol overnight Acute metabolic encephalopathy HTN CHF L rib fx's   LOS: 9 days    Jacinto Halim , [facility] 12/05/2020, 10:11 AM Please see Amion for pager number during day hours 7:00am-4:30pm

## 2020-12-05 NOTE — Progress Notes (Signed)
PHARMACY - TOTAL PARENTERAL NUTRITION CONSULT NOTE   Indication: Prolonged ileus   Patient Measurements: Height: 5\' 10"  (177.8 cm) Weight: 122.6 kg (270 lb 4.5 oz) IBW/kg (Calculated) : 73 TPN AdjBW (KG): 82.8 Body mass index is 38.78 kg/m. Usual Weight: 118kg   Assessment: Patient presented to [facility] ED 8/15 with epigastric pain, found to have acute pancreatitis secondary to alcohol use. On 8/18, he developed altered mental status and hypoxia, for which he was transferred to the ICU. He was started on tube feeds 8/18-8/19. On 8/20 there was concern for ileus vs. SBO due to abdominal distension and general surgery was consulted. CT scan at that time showed distal SBO. CT 8/24 shows contrast through the distal small bowel and colon, more concerning for ileus. CT also shows progression of pancreatitis with increasing inflammatory changes, as well as a new large subscapular hematoma. Notably, patient has also received a Roux-en-Y Gastric Bypass Surgery in 2017, and has multiple nutrient deficiencies, including Vitamin A (11.3), Vitamin C (0.2), Zinc (42). Pharmacy has been consulted for TPN dosing to meet nutritional needs.   Below is the timeline for feeding as denoted by 2018, RD:   8/16 - clear liquid diet initiated at 1529  8/17 - regular diet initiated at 0839 (PO 25-50% x 2 meals)  8/18 - found unresponsive on floor, intubated, trickle TF initiated  8/19 - TF advanced to goal  8/20 - TF d/c for ileus vs SBO, OG tube to suction  8/21 - CT abdomen/pelvis showing distal SBO  8/23 - extubated, OG tube removed  Glucose / Insulin: Current CBGs running 140s-160s on sSSI and no feeding (used 7U aspart in past 24h)  Electrolytes: Keep K>4, Mg>2, per surgery; Mg 1.9 (supplemented with 2g Iv x1, K replaced by primary team), elevated Na and Cl (Na 154, Cl 119). Removed Sodium from TPN and maxed acetate.  Renal: Scr 0.9 (improved); Good UOP (0.6 mL/kg/h)  Hepatic: LFTs WNL, Tbili trending down,  hepatic steatosis noted on CT 8/24  Intake / Output; MIVF: +1L  GI Imaging: 8/24 CT shows hepatatic steatosis, oral contrast through small bowel and colon indicative of ileus, and progressive pancreatitis with increasing inflammation.  GI Surgeries / Procedures: Roux-en-Y gastric Bypass 2017. No surgeries since start of TPN.    Central access: None  TPN start date: 12/05/20  Nutritional Goals: Goal TPN rate is 105 mL/hr (provides 146 g of protein and ~2,630 kcals per day)  RD Assessment: Estimated Needs Total Energy Estimated Needs: 2500-2700 Total Protein Estimated Needs: 130-150 grams Total Fluid Estimated Needs: >/= 2.0 L  Current Nutrition:  NPO  Plan:  Start TPN at 40 mL/hr at 1800- Notably patient at risk for refeeding syndrome due to alcohol use and malnutrition at baseline.  Electrolytes in TPN: removed Na 0 mEq/L, K 85mEq/L, Ca 62mEq/L, Mg 59mEq/L, and Phos 71mmol/L. Maximized acetate.  Add standard MVI and trace elements to TPN. Added Zinc, folic acid, thiamine.  Initiate Moderate q4h SSI and adjust as needed  Not on MIVF.  Monitor TPN labs on Mon/Thurs, Check CMET, Mg, Phos in AM. Will replete aggressively with signs of refeeding and following surgery recommendations for electrolyte replacement.  Magnesium 2g IV x1 repleted per protocol (Mg 1.9).  Will add enteral Vitamins A and C when able to use GI tract.   12m, PharmD PGY-1 Acute Care Resident  12/05/2020 2:57 PM

## 2020-12-05 NOTE — Progress Notes (Signed)
Nutrition Follow-up  DOCUMENTATION CODES:   Not applicable  INTERVENTION:   - TPN dosing per Pharmacy  Monitor magnesium, potassium, and phosphorus twice daily for at least 3 days, MD to replete as needed, as pt is at risk for refeeding syndrome given inadequate nutrition x 9 days, hypokalemia.  - Recommend repleting vitamin A, vitamin C, and zinc given deficiencies; discussed with Pharmacy  NUTRITION DIAGNOSIS:   Increased nutrient needs related to acute illness as evidenced by estimated needs.  Ongoing, being addressed via initiation of TPN  GOAL:   Patient will meet greater than or equal to 90% of their needs  Progressing with initiation of TPN  MONITOR:   Diet advancement, Labs, Weight trends, I & O's, Other (TPN)  REASON FOR ASSESSMENT:   Ventilator, Consult Enteral/tube feeding initiation and management  ASSESSMENT:   Patient with PMH significant for HTN, CHF, s/p Roux en Y, and ETOH use. Presents this admission with acute pancreatitis and ETOH withdrawal.  8/15 - admit, NPO 8/16 - clear liquid diet initiated at 1529 8/17 - regular diet initiated at 0839 (PO 25-50% x 2 meals) 8/18 - found unresponsive on floor, intubated, trickle TF initiated 8/19 - TF advanced to goal 8/20 - TF d/c for ileus vs SBO, OG tube to suction 8/21 - CT abdomen/pelvis showing distal SBO 8/23 - extubated, OG tube removed  Discussed pt with RN and during ICU rounds. Pt more somnolent today with some confusion and hallucinations reported overnight. Repeat CT abdomen/pelvis today with findings of dilated loops of small bowel most likely to be related to ileus than partial SBO. Also with findings of a new large subscapular hematoma associated with the spleen with hemorrhagic fluid in the peritoneal cavity as well as progression of pancreatitis with increasing inflammatory changes and possible areas of hypoenhancement in the body and tail of the pancreas which could reflect developing  pancreatic necrosis.  Abdominal x-ray today showing "persistent gaseous distention of the small and large bowel loops compatible with ileus." Enteric contrast materia was noted within the distal colon.  Pt has been extubated but remains NPO. Pt with minimal nutrition x 9 days. Pt was on a regular diet for 1 day and received full tube feeds for 1 day. Discussed pt with CCM and Surgery. Plan is to start TPN today per CCM. TPN ordered to start at 40 ml/hr.  Discussed pt with Pharmacy. Plan is to add additional zinc to TPN to replete given deficiency. Will monitor for pt's ability to take POs and then order vitamin A and vitamin C PO for repletion. Vitamin C is not available in IV form per Pharmacy and vitamin A is only available in IM injection form. Will also monitor for refeeding labs with initiation of TPN.  Discussed plan with pt's RN and and with pt's significant other at bedside.  Admit weight: 112 kg Current weight: 122.6 kg  Weight elevated compared to admit weight. However, pt with non-pitting edema to BUE and +1 pitting edema to BLE. Will utilize admit weight of 112 kg as EDW.  Vitamin/Mineral Profile:  Vitamin A: 11.3 (low) Vitamin D: 33.01 (WNL) Vitamin C: 0.2 (low) Copper: 71 (low end of normal) Zinc: 42 (low)  Medications reviewed and include: IV folic acid, SSI q 4 hours, IV protonix, thiamine, IV abx, IV KCl 10 mEq x 6 runs, IV magnesium sulfate 2 grams once  Labs reviewed: sodium 154, potassium 3.4, hemoglobin 9.2 CBG's: 105-178 x 24 hours  UOP: 1750 ml x 24 hours I/O's: +  11.0 L since admit  Diet Order:   Diet Order     None       EDUCATION NEEDS:   Not appropriate for education at this time  Skin:  Skin Assessment: Reviewed RN Assessment  Last BM:  12/04/20 multiple type 5/type 6  Height:   Ht Readings from Last 1 Encounters:  11/26/20 5\' 10"  (1.778 m)    Weight:   Wt Readings from Last 1 Encounters:  12/05/20 122.6 kg    BMI:  Body mass index  is 38.78 kg/m.  Estimated Nutritional Needs:   Kcal:  2500-2700  Protein:  130-150 grams  Fluid:  >/= 2.0 L    12/07/20, MS, RD, LDN Inpatient Clinical Dietitian Please see AMiON for contact information.

## 2020-12-05 NOTE — Progress Notes (Signed)
[facility] ADULT ICU REPLACEMENT PROTOCOL   The patient does apply for the [facility] Adult ICU Electrolyte Replacment Protocol based on the criteria listed below:   1.Exclusion criteria: TCTS patients, ECMO patients and Hypothermia Protocol, and   Dialysis patients 2. Is GFR >/= 30 ml/min? Yes.    Patient's GFR today is >60 3. Is SCr </= 2? Yes.   Patient's SCr is 0.92 mg/dL 4. Did SCr increase >/= 0.5 in 24 hours? No. 5.Pt's weight >40kg  Yes.   6. Abnormal electrolyte(s): K+ 3.3  7. Electrolytes replaced per protocol 8.  Call MD STAT for K+ </= 2.5, Phos </= 1, or Mag </= 1 Physician:  n/a  Melvern Banker 12/05/2020 5:52 AM

## 2020-12-05 NOTE — Progress Notes (Signed)
Spoke with Dr. Fredia Sorrow with IR regarding his CT scan findings.  The fluid around the liver appears to be simple fluid and not blood.  The splenic hematoma appears contained within the splenic capsule at this time.  The patient's BP is soft, but stable as is his HR.  HGB is 9.2 which has dropped from 11 yesterday.  The patient is not on anticoagulation and Dr. Fredia Sorrow does not feel this is secondary to his pancreatitis and there is no evidence of pseudoaneurysm either.  For now, he does not think he needs embolization; however, if he worsens and becomes more unstable with further evidence of continued bleeding, then we can order an IR embolization.  We will closely follow.  Will recheck PT/INR as well as CBC q 6 hrs for the next 24hrs.  Letha Cape 12:12 PM 12/05/2020

## 2020-12-05 NOTE — Progress Notes (Addendum)
eLink Physician-Brief Progress Note Patient Name: Ernest Rodgers DOB: [birth year] MRN: [id number]   Date of Service  12/05/2020  HPI/Events of Note  Patient with acute urinary retention, straight catheters are on back order and are not available. ABG and CXR reviewed and consistent with bi-basilar atelectasis, K+ 3.4.  eICU Interventions  Order to insert foley catheter entered. Incentive spirometer ordered to be used Q 1 hour when patient is more alert. KCL replaced per protocol.        Thomasene Lot Arthur Speagle 12/05/2020, 6:20 AM

## 2020-12-05 NOTE — Progress Notes (Signed)
eLink Physician-Brief Progress Note Patient Name: Ernest Rodgers DOB: 1970-07-03 MRN: [id number]   Date of Service  12/05/2020  HPI/Events of Note  Patient with persistent tachypnea / tachycardia, he's also spiked a temperature > 101. Patient also had a transient desaturation to 88 %. He is now quite somnolent following iv Ativan given per CIWA protocol.K+ 3.3.  eICU Interventions  K+ replaced per protocol. Stat CXR and ABG ordered.        Thomasene Lot Ernest Rodgers 12/05/2020, 4:10 AM

## 2020-12-05 NOTE — Progress Notes (Signed)
eLink Physician-Brief Progress Note Patient Name: Ernest Rodgers DOB: 20-Apr-1970 MRN: [id number]   Date of Service  12/05/2020  HPI/Events of Note  Patient with transiently soft blood pressure and sinus tachycardia, hemoglobin is 7.4 gm, down from 8.7 gm earlier today, patient has a sub-capsular splenic hematoma on CTAP. Serum Albumin is 1.7 gm / dl.  eICU Interventions  Albumin 25 % 50 gm iv x 1, will trend H & H Q 6 hours for the next 24 hours and transfuse for a hemoglobin < 7 gm / dl. Type and Screen ordered.        Migdalia Dk 12/05/2020, 8:47 PM

## 2020-12-05 NOTE — Progress Notes (Signed)
NAME:  Ernest Rodgers, MRN:  [id number], DOB:  [birth year], LOS: 9 ADMISSION DATE:  11/26/2020, CONSULTATION DATE:  11/29/20 REFERRING MD:  Rachael Darby - FPTS, CHIEF COMPLAINT:  Unresponsive and Hypoxic    History of Present Illness:  50 yo male former smoker presented with epigastric pain.  He fell prior to admission and found to have Lt 7th rib fracture.  Found to have acute pancreatitis.  Started on CIWA for ETOH withdrawal.  Developed altered mental status with hypoxia on 8/18 and transferred to ICU.  He works as a travel Engineer, civil (consulting). Extubated 8/23, severe hallucinations and agitation in the evening  Pertinent  Medical History  ETOH, s/p Roux en Y, Diastolic CHF, HTN, Depression, s/p cholecystectomy  Significant Hospital Events: Including procedures, antibiotic start and stop dates in addition to other pertinent events   8/15 admitted to [facility] for acute pancreatitis  8/16 EtOH withdrawal sx  8/17 incr CIWA scores  8/18 found to be unresponsive on floor and hypoxic. moving to ICU, intubated  8/20 ileus versus SBO,  8/21 General Surgery consulted for SBO 8/23 extubated  Interim History / Subjective:  Agitated last evening, requiring Ativan and Haldol Sedate this morning Increased abdominal distention, hypoactive bowel sounds  Objective   Blood pressure (!) 133/109, pulse (!) 125, temperature (!) 100.4 F (38 C), temperature source Rectal, resp. rate (!) 28, height 5\' 10"  (1.778 m), weight 122.6 kg, SpO2 96 %.    Vent Mode: CPAP;PSV FiO2 (%):  [40 %] 40 % PEEP:  [8 cmH20] 8 cmH20 Pressure Support:  [5 cmH20] 5 cmH20   Intake/Output Summary (Last 24 hours) at 12/05/2020 0745 Last data filed at 12/05/2020 0700 Gross per 24 hour  Intake 1518.69 ml  Output 1750 ml  Net -231.31 ml    Filed Weights   12/03/20 0500 12/04/20 0333 12/05/20 0630  Weight: 121.9 kg 126.7 kg 122.6 kg   Appearance -middle-age, does not appear to be in distress ENMT -dry oral mucosa Neck -no JVD, no  thyromegaly Respiratory -fair air entry bilaterally, no wheezes or rales CV -S1-S2 appreciated with no murmur  GI -distended, hypoactive bowel sounds  lymph - no adenopathy noted in neck and axillary areas Ext - no cyanosis, clubbing, or joint inflammation noted Skin - no rashes, lesions, or ulcers  Resolved Hospital Problem list     Assessment & Plan:   Acute hypoxemic respiratory failure secondary to aspiration pneumonitis in setting of acute pancreatitis -Extubated 8/23 2022 -On oxygen supplementation  Agitation/hallucinations/anxiety -Less likely related to alcohol withdrawal as he has been in the hospital for over a week -Required benzodiazepine and Haldol -We will continue to monitor closely -Initiated CIWA  Acute alcoholic pancreatitis Small bowel obstruction versus IBS -Remains n.p.o. -With increased abdominal distention, hypoactive bowel sounds -We will request for CT scan of the abdomen and pelvis  Atelectasis, recent aspiration pneumonia MRSA pneumonia on linezolid Tachypnea -Obtain CT chest chest with contrast-tachypnea in the 30s, tachycardia -On antibiotics for aspiration  Acute metabolic encephalopathy from hypoxia, pancreatitis -LFTs downtrending  History of hypertension Chronic diastolic congestive heart failure -Replete electrolytes -Cautious diuresis  Acute kidney injury in the setting of critical illness -Back to baseline  Acute nondisplaced left rib fractures -As needed fentanyl  Hyperglycemia -SSI  Hypernatremia -Cautious D5 water  Discussed with spouse at bedside Will obtain CT abdomen and pelvis and CT chest  Best Practice (right click and "Reselect all SmartList Selections" daily)   Diet/type: NPO DVT prophylaxis: LMWH GI prophylaxis: PPI Code Status:  full code Last date of multidisciplinary goals of care discussion: updated family at bedside on 8/24  Labs    CMP Latest Ref Rng & Units 12/05/2020 12/05/2020 12/04/2020   Glucose 70 - 99 mg/dL - 702(O) 378(H)  BUN 6 - 20 mg/dL - 88(F) 02(D)  Creatinine 0.61 - 1.24 mg/dL - 7.41 2.87  Sodium 867 - 145 mmol/L 154(H) 151(H) 149(H)  Potassium 3.5 - 5.1 mmol/L 3.4(L) 3.3(L) 3.6  Chloride 98 - 111 mmol/L - 119(H) 117(H)  CO2 22 - 32 mmol/L - 23 26  Calcium 8.9 - 10.3 mg/dL - 8.3(L) 8.2(L)  Total Protein 6.5 - 8.1 g/dL - 4.9(L) 4.9(L)  Total Bilirubin 0.3 - 1.2 mg/dL - 1.6(H) 1.5(H)  Alkaline Phos 38 - 126 U/L - 64 58  AST 15 - 41 U/L - 37 35  ALT 0 - 44 U/L - 41 45(H)    CBC Latest Ref Rng & Units 12/05/2020 12/05/2020 12/04/2020  WBC 4.0 - 10.5 K/uL - 15.1(H) 9.0  Hemoglobin 13.0 - 17.0 g/dL 6.7(M) 10.8(L) 11.9(L)  Hematocrit 39.0 - 52.0 % 27.0(L) 31.9(L) 35.4(L)  Platelets 150 - 400 K/uL - 218 128(L)    ABG    Component Value Date/Time   PHART 7.579 (H) 12/05/2020 0416   PCO2ART 21.3 (L) 12/05/2020 0416   PO2ART 80 (L) 12/05/2020 0416   HCO3 19.7 (L) 12/05/2020 0416   TCO2 20 (L) 12/05/2020 0416   ACIDBASEDEF 1.0 12/05/2020 0416   O2SAT 97.0 12/05/2020 0416    CBG (last 3)  Recent Labs    12/04/20 2315 12/05/20 0324 12/05/20 0722  GLUCAP 168* 178* 138*    The patient is critically ill with multiple organ systems failure and requires high complexity decision making for assessment and support, frequent evaluation and titration of therapies, application of advanced monitoring technologies and extensive interpretation of multiple databases. Critical Care Time devoted to patient care services described in this note independent of APP/resident time (if applicable)  is 35 minutes.   Virl Diamond MD [facility] Personal pager: See Amion If unanswered, please page CCM On-call: #[phone]

## 2020-12-05 NOTE — Progress Notes (Signed)
Due to patient's tachycardia, fever and somnolence, discussed return to ICU level of care with CCM NP Brett Canales Minor. Patient will remain in ICU at this time. FPTS will resume care when patient is appropriate for floor.   Ronnald Ramp, MD  [facility] Service, PGY-3  FPTS Intern Pager [phone]

## 2020-12-05 NOTE — Progress Notes (Signed)
PT Cancellation Note  Patient Details Name: Ernest Rodgers MRN: [id number] DOB: [birth year]   Cancelled Treatment:    Reason Eval/Treat Not Completed: Medical issues which prohibited therapy;Patient not medically ready - awaiting repeat abdominal CT, RN requesting hold. Will check back tomorrow.   Marye Round, PT DPT Acute Rehabilitation Services Pager [phone]  Office [phone]    Truddie Coco 12/05/2020, 1:34 PM

## 2020-12-05 NOTE — Progress Notes (Signed)
Peripherally Inserted Central Catheter Placement  The IV Nurse has discussed with the patient and/or persons authorized to consent for the patient, the purpose of this procedure and the potential benefits and risks involved with this procedure.  The benefits include less needle sticks, lab draws from the catheter, and the patient may be discharged home with the catheter. Risks include, but not limited to, infection, bleeding, blood clot (thrombus formation), and puncture of an artery; nerve damage and irregular heartbeat and possibility to perform a PICC exchange if needed/ordered by physician.  Alternatives to this procedure were also discussed.  Bard Power PICC patient education guide, fact sheet on infection prevention and patient information card has been provided to patient /or left at bedside.    PICC Placement Documentation  PICC Triple Lumen 12/05/20 PICC Basilic 42 cm 0 cm (Active)  Indication for Insertion or Continuance of Line Administration of hyperosmolar/irritating solutions (i.e. TPN, Vancomycin, etc.) 12/05/20 1656  Exposed Catheter (cm) 0 cm 12/05/20 1656  Site Assessment Clean;Intact;Dry 12/05/20 1656  Lumen #1 Status Saline locked;Heparin locked;Blood return noted;Flushed 12/05/20 1656  Lumen #2 Status Flushed;Saline locked;Blood return noted 12/05/20 1656  Lumen #3 Status Flushed;Saline locked;Blood return noted 12/05/20 1656  Dressing Type Transparent 12/05/20 1656  Dressing Status Clean;Dry;Intact 12/05/20 1656  Antimicrobial disc in place? Yes 12/05/20 1656  Dressing Intervention New dressing;Other (Comment) 12/05/20 1656  Dressing Change Due 12/12/20 12/05/20 1656   Telephone consent signed by wife    [name] 12/05/2020, 4:57 PM

## 2020-12-05 NOTE — Progress Notes (Signed)
Pt scoring MEWS 8. FPTS supposed to resume care on 12/05/20. I discussed with Dr Warrick Parisian regarding the appropriateness of pt coming back to the floor given that he is somnolent and unstable. He recommends the day team speak with the day ICU team regarding team as he is [facility].  Towanda Octave MD PGY-3, [facility]

## 2020-12-06 ENCOUNTER — Inpatient Hospital Stay (HOSPITAL_COMMUNITY): Payer: Managed Care, Other (non HMO)

## 2020-12-06 DIAGNOSIS — K851 Biliary acute pancreatitis without necrosis or infection: Secondary | ICD-10-CM | POA: Diagnosis not present

## 2020-12-06 DIAGNOSIS — N179 Acute kidney failure, unspecified: Secondary | ICD-10-CM | POA: Diagnosis not present

## 2020-12-06 DIAGNOSIS — R1011 Right upper quadrant pain: Secondary | ICD-10-CM | POA: Diagnosis not present

## 2020-12-06 DIAGNOSIS — R0603 Acute respiratory distress: Secondary | ICD-10-CM | POA: Diagnosis not present

## 2020-12-06 HISTORY — PX: IR EMBO ART  VEN HEMORR LYMPH EXTRAV  INC GUIDE ROADMAPPING: IMG5450

## 2020-12-06 HISTORY — PX: IR ANGIOGRAM VISCERAL SELECTIVE: IMG657

## 2020-12-06 HISTORY — PX: IR US GUIDE VASC ACCESS RIGHT: IMG2390

## 2020-12-06 LAB — HEMOGLOBIN AND HEMATOCRIT, BLOOD
HCT: 19.5 % — ABNORMAL LOW (ref 39.0–52.0)
HCT: 21.9 % — ABNORMAL LOW (ref 39.0–52.0)
HCT: 22.2 % — ABNORMAL LOW (ref 39.0–52.0)
Hemoglobin: 6.5 g/dL — CL (ref 13.0–17.0)
Hemoglobin: 7.1 g/dL — ABNORMAL LOW (ref 13.0–17.0)
Hemoglobin: 7.1 g/dL — ABNORMAL LOW (ref 13.0–17.0)

## 2020-12-06 LAB — COMPREHENSIVE METABOLIC PANEL
ALT: 29 U/L (ref 0–44)
AST: 43 U/L — ABNORMAL HIGH (ref 15–41)
Albumin: 2.4 g/dL — ABNORMAL LOW (ref 3.5–5.0)
Alkaline Phosphatase: 59 U/L (ref 38–126)
Anion gap: 9 (ref 5–15)
BUN: 35 mg/dL — ABNORMAL HIGH (ref 6–20)
CO2: 23 mmol/L (ref 22–32)
Calcium: 8.1 mg/dL — ABNORMAL LOW (ref 8.9–10.3)
Chloride: 119 mmol/L — ABNORMAL HIGH (ref 98–111)
Creatinine, Ser: 0.87 mg/dL (ref 0.61–1.24)
GFR, Estimated: >60 mL/min (ref >60)
Glucose, Bld: 181 mg/dL — ABNORMAL HIGH (ref 70–99)
Potassium: 3.2 mmol/L — ABNORMAL LOW (ref 3.5–5.1)
Sodium: 151 mmol/L — ABNORMAL HIGH (ref 135–145)
Total Bilirubin: 1.7 mg/dL — ABNORMAL HIGH (ref 0.3–1.2)
Total Protein: 5.1 g/dL — ABNORMAL LOW (ref 6.5–8.1)

## 2020-12-06 LAB — CBC
HCT: 20.8 % — ABNORMAL LOW (ref 39.0–52.0)
HCT: 20.4 % — ABNORMAL LOW (ref 39.0–52.0)
Hemoglobin: 6.6 g/dL — CL (ref 13.0–17.0)
Hemoglobin: 6.8 g/dL — CL (ref 13.0–17.0)
MCH: 38.8 pg — ABNORMAL HIGH (ref 26.0–34.0)
MCH: 38.6 pg — ABNORMAL HIGH (ref 26.0–34.0)
MCHC: 31.7 g/dL (ref 30.0–36.0)
MCHC: 33.3 g/dL (ref 30.0–36.0)
MCV: 122.4 fL — ABNORMAL HIGH (ref 80.0–100.0)
MCV: 115.9 fL — ABNORMAL HIGH (ref 80.0–100.0)
Platelets: 209 10*3/uL (ref 150–400)
Platelets: 163 10*3/uL (ref 150–400)
RBC: 1.7 MIL/uL — ABNORMAL LOW (ref 4.22–5.81)
RBC: 1.76 MIL/uL — ABNORMAL LOW (ref 4.22–5.81)
RDW: 15.9 % — ABNORMAL HIGH (ref 11.5–15.5)
RDW: 18.6 % — ABNORMAL HIGH (ref 11.5–15.5)
WBC: 14.3 10*3/uL — ABNORMAL HIGH (ref 4.0–10.5)
WBC: 12.5 10*3/uL — ABNORMAL HIGH (ref 4.0–10.5)
nRBC: 0 % (ref 0.0–0.2)
nRBC: 0 % (ref 0.0–0.2)

## 2020-12-06 LAB — TRIGLYCERIDES: Triglycerides: 391 mg/dL — ABNORMAL HIGH (ref <150)

## 2020-12-06 LAB — PREPARE RBC (CROSSMATCH)

## 2020-12-06 LAB — GLUCOSE, CAPILLARY
Glucose-Capillary: 175 mg/dL — ABNORMAL HIGH (ref 70–99)
Glucose-Capillary: 192 mg/dL — ABNORMAL HIGH (ref 70–99)
Glucose-Capillary: 194 mg/dL — ABNORMAL HIGH (ref 70–99)
Glucose-Capillary: 197 mg/dL — ABNORMAL HIGH (ref 70–99)
Glucose-Capillary: 199 mg/dL — ABNORMAL HIGH (ref 70–99)
Glucose-Capillary: 217 mg/dL — ABNORMAL HIGH (ref 70–99)

## 2020-12-06 LAB — HEMOGLOBIN A1C
Hgb A1c MFr Bld: 5.5 % (ref 4.8–5.6)
Mean Plasma Glucose: 111.15 mg/dL

## 2020-12-06 LAB — MAGNESIUM: Magnesium: 2.3 mg/dL (ref 1.7–2.4)

## 2020-12-06 LAB — PHOSPHORUS: Phosphorus: 3 mg/dL (ref 2.5–4.6)

## 2020-12-06 MED ORDER — IOHEXOL 350 MG/ML SOLN
100.0000 mL | Freq: Once | INTRAVENOUS | Status: AC | PRN
  Administered 2020-12-06: 20 mL

## 2020-12-06 MED ORDER — FENTANYL CITRATE (PF) 100 MCG/2ML IJ SOLN
INTRAMUSCULAR | Status: AC
  Filled 2020-12-06: qty 2

## 2020-12-06 MED ORDER — MIDAZOLAM HCL 2 MG/2ML IJ SOLN
INTRAMUSCULAR | Status: AC
  Filled 2020-12-06: qty 2

## 2020-12-06 MED ORDER — FENTANYL CITRATE (PF) 100 MCG/2ML IJ SOLN
INTRAMUSCULAR | Status: AC | PRN
  Administered 2020-12-06 (×6): 25 ug via INTRAVENOUS
  Administered 2020-12-06: 50 ug via INTRAVENOUS

## 2020-12-06 MED ORDER — MIDAZOLAM HCL 2 MG/2ML IJ SOLN
INTRAMUSCULAR | Status: AC
  Filled 2020-12-06: qty 4

## 2020-12-06 MED ORDER — ACETAMINOPHEN 650 MG RE SUPP
650.0000 mg | RECTAL | Status: DC | PRN
  Filled 2020-12-06: qty 1

## 2020-12-06 MED ORDER — IOHEXOL 350 MG/ML SOLN
100.0000 mL | Freq: Once | INTRAVENOUS | Status: AC | PRN
  Administered 2020-12-12: 100 mL

## 2020-12-06 MED ORDER — POTASSIUM CHLORIDE 10 MEQ/50ML IV SOLN
10.0000 meq | INTRAVENOUS | Status: AC
  Administered 2020-12-06 (×6): 10 meq via INTRAVENOUS
  Filled 2020-12-06 (×5): qty 50

## 2020-12-06 MED ORDER — DEXTROSE 5 % IV BOLUS
500.0000 mL | Freq: Once | INTRAVENOUS | Status: AC
  Administered 2020-12-06: 500 mL via INTRAVENOUS

## 2020-12-06 MED ORDER — MIDAZOLAM HCL 2 MG/2ML IJ SOLN
INTRAMUSCULAR | Status: AC | PRN
Start: 2020-12-06 — End: 2020-12-06
  Administered 2020-12-06: 1 mg via INTRAVENOUS
  Administered 2020-12-06: 0.5 mg via INTRAVENOUS
  Administered 2020-12-06: 1 mg via INTRAVENOUS
  Administered 2020-12-06: 0.5 mg via INTRAVENOUS
  Administered 2020-12-06 (×2): 1 mg via INTRAVENOUS
  Administered 2020-12-06 (×2): 0.5 mg via INTRAVENOUS

## 2020-12-06 MED ORDER — SODIUM CHLORIDE 0.9% IV SOLUTION: Freq: Once | INTRAVENOUS | Status: AC

## 2020-12-06 MED ORDER — ALTEPLASE 2 MG IJ SOLR
2.0000 mg | Freq: Once | INTRAMUSCULAR | Status: AC
  Administered 2020-12-06: 2 mg
  Filled 2020-12-06: qty 2

## 2020-12-06 MED ORDER — INSULIN ASPART 100 UNIT/ML IJ SOLN
0.0000 [IU] | INTRAMUSCULAR | Status: DC
  Administered 2020-12-06 (×3): 4 [IU] via SUBCUTANEOUS
  Administered 2020-12-07 (×2): 7 [IU] via SUBCUTANEOUS
  Administered 2020-12-07 (×2): 4 [IU] via SUBCUTANEOUS
  Administered 2020-12-07: 3 [IU] via SUBCUTANEOUS
  Administered 2020-12-07: 4 [IU] via SUBCUTANEOUS
  Administered 2020-12-08 (×2): 3 [IU] via SUBCUTANEOUS

## 2020-12-06 MED ORDER — POTASSIUM PHOSPHATES 150 MMOLE/50ML IV SOLN
INTRAVENOUS | Status: AC
Start: 2020-12-06 — End: 2020-12-07
  Filled 2020-12-06: qty 835.2

## 2020-12-06 MED ORDER — SODIUM CHLORIDE 0.9% IV SOLUTION: Freq: Once | INTRAVENOUS | Status: DC

## 2020-12-06 MED ORDER — LIDOCAINE HCL 1 % IJ SOLN
INTRAMUSCULAR | Status: AC | PRN
Start: 2020-12-06 — End: 2020-12-06
  Administered 2020-12-06: 10 mL

## 2020-12-06 MED ORDER — LIDOCAINE HCL 1 % IJ SOLN
INTRAMUSCULAR | Status: AC
  Filled 2020-12-06: qty 20

## 2020-12-06 NOTE — Progress Notes (Signed)
NAME:  Ernest Rodgers, MRN:  [id number], DOB:  1970-10-29, LOS: 10 ADMISSION DATE:  11/26/2020, CONSULTATION DATE:  11/29/20 REFERRING MD:  Rachael Darby - FPTS, CHIEF COMPLAINT:  Unresponsive and Hypoxic    History of Present Illness:  50 yo male former smoker presented with epigastric pain.  He fell prior to admission and found to have Lt 7th rib fracture.  Found to have acute pancreatitis.  Started on CIWA for ETOH withdrawal.  Developed altered mental status with hypoxia on 8/18 and transferred to ICU.  He works as a travel Engineer, civil (consulting). Extubated 8/23, severe hallucinations and agitation in the evening  Pertinent  Medical History  ETOH, s/p Roux en Y, Diastolic CHF, HTN, Depression, s/p cholecystectomy  Significant Hospital Events: Including procedures, antibiotic start and stop dates in addition to other pertinent events   8/15 admitted to [facility] for acute pancreatitis  8/16 EtOH withdrawal sx  8/17 incr CIWA scores  8/18 found to be unresponsive on floor and hypoxic. moving to ICU, intubated  8/20 ileus versus SBO,  8/21 General Surgery consulted for SBO 8/23 extubated CT abdomen reveals peri-splenic hematoma    Antibiotics Unasyn 8/19-8/21 Vancomycin 8/20 Ceftriaxone 8/22>> Linezolid 8/21>>  Interim History / Subjective:  More alert and interactive today Still has episodes of confusion  Objective   Blood pressure 135/82, pulse (!) 112, temperature 98.8 F (37.1 C), temperature source Core, resp. rate (!) 21, height 5\' 10"  (1.778 m), weight 122.6 kg, SpO2 100 %.        Intake/Output Summary (Last 24 hours) at 12/06/2020 0753 Last data filed at 12/06/2020 12/08/2020 Gross per 24 hour  Intake 2260.58 ml  Output 300 ml  Net 1960.58 ml   Filed Weights   12/03/20 0500 12/04/20 0333 12/05/20 0630  Weight: 121.9 kg 126.7 kg 122.6 kg   Appearance -middle-aged, does not appear to be in distress ENMT -dry oral mucosa Neck -no JVD, no thyromegaly Respiratory -fair air entry bilaterally with  no wheezes or rales CV -S1-S2 appreciated GI -hypoactive bowel sounds lymph - no adenopathy noted in neck and axillary areas Ext -no clubbing no cyanosis Skin - no rashes, lesions, or ulcers  CT abdomen and pelvis shows a perisplenic hematoma Drop in hematocrit overnight suggests still actively bleeding  CT chest shows atelectasis bibasilarly  Resolved Hospital Problem list     Assessment & Plan:   Perisplenic hematoma Anemia -Transfuse 1 unit -Contact IR for possible embolization -Transfused 1 unit overnight however, H&H still remains low  Acute hypoxemic respiratory failure secondary to aspiration pneumonitis in the setting of acute pancreatitis -Extubated 12/04/2020 -Remains on oxygen supplementation -Respiratory status appears to be stable at present  Agitation/hallucination/anxiety -Less likely at will related to alcohol withdrawal, has been in the hospital for over 1 week -Continue to monitor closely -On CIWA protocol  Alcoholic pancreatitis Small bowel obstruction versus ileus -Remains n.p.o. -TPN started  Metabolic encephalopathy -Continue to monitor  History of hypertension, chronic diastolic congestive heart failure -Replete electrolytes -Cautious diuresis  Atelectasis MRSA pneumonia on linezolid Ceftriaxone  Acute kidney injury in setting of critical illness -Continue to trend electrolytes -Avoid nephrotoxic agents  Acute nondisplaced left rib fractures -As needed fentanyl  Hyperglycemia -SSI  Hypernatremia -Cautious D5 water -Trend electrolytes, labs pending this morning  Discussed with spouse at bedside -We will transfuse, contact IR for possible embolization   Best Practice (right click and "Reselect all SmartList Selections" daily)   Diet/type: NPO DVT prophylaxis: LMWH GI prophylaxis: PPI Code Status:  full code  Last date of multidisciplinary goals of care discussion: updated family at bedside on 8/24  Labs    CMP Latest Ref  Rng & Units 12/05/2020 12/05/2020 12/04/2020  Glucose 70 - 99 mg/dL - 097(D) 532(D)  BUN 6 - 20 mg/dL - 92(E) 26(S)  Creatinine 0.61 - 1.24 mg/dL - 3.41 9.62  Sodium 229 - 145 mmol/L 154(H) 151(H) 149(H)  Potassium 3.5 - 5.1 mmol/L 3.4(L) 3.3(L) 3.6  Chloride 98 - 111 mmol/L - 119(H) 117(H)  CO2 22 - 32 mmol/L - 23 26  Calcium 8.9 - 10.3 mg/dL - 8.3(L) 8.2(L)  Total Protein 6.5 - 8.1 g/dL - 4.9(L) 4.9(L)  Total Bilirubin 0.3 - 1.2 mg/dL - 1.6(H) 1.5(H)  Alkaline Phos 38 - 126 U/L - 64 58  AST 15 - 41 U/L - 37 35  ALT 0 - 44 U/L - 41 45(H)    CBC Latest Ref Rng & Units 12/06/2020 12/06/2020 12/05/2020  WBC 4.0 - 10.5 K/uL 12.5(H) 14.3(H) -  Hemoglobin 13.0 - 17.0 g/dL 6.8(LL) 6.6(LL) 7.1(L)  Hematocrit 39.0 - 52.0 % 20.4(L) 20.8(L) 22.2(L)  Platelets 150 - 400 K/uL 163 209 -    ABG    Component Value Date/Time   PHART 7.579 (H) 12/05/2020 0416   PCO2ART 21.3 (L) 12/05/2020 0416   PO2ART 80 (L) 12/05/2020 0416   HCO3 19.7 (L) 12/05/2020 0416   TCO2 20 (L) 12/05/2020 0416   ACIDBASEDEF 1.0 12/05/2020 0416   O2SAT 97.0 12/05/2020 0416    CBG (last 3)  Recent Labs    12/06/20 0046 12/06/20 0328 12/06/20 0703  GLUCAP 197* 217* 175*   The patient is critically ill with multiple organ systems failure and requires high complexity decision making for assessment and support, frequent evaluation and titration of therapies, application of advanced monitoring technologies and extensive interpretation of multiple databases. Critical Care Time devoted to patient care services described in this note independent of APP/resident time (if applicable)  is 32 minutes.   Virl Diamond MD [facility] Personal pager: See Amion If unanswered, please page CCM On-call: #[phone]

## 2020-12-06 NOTE — Progress Notes (Signed)
eLink Physician-Brief Progress Note Patient Name: Ernest Rodgers DOB: 08-15-1970 MRN: [id number]   Date of Service  12/06/2020  HPI/Events of Note  Hemoglobin 6.5 gm / dl. Temp spike to 101.  eICU Interventions  Order entered to transfuse one unit of PRBC. PRN Tylenol suppository ordered, as well as a cooling blanket.        Migdalia Dk 12/06/2020, 11:34 PM

## 2020-12-06 NOTE — Progress Notes (Addendum)
Subjective: CC: Patient alert and orientated to self and time. He is not orientated to place ([facility] is [facility] but knows he is in a hospital) or situation. He denies any n/v or abdominal pain. Reports last flatus was the day before yesterday. No BM documented overnight. His hgb has fallen to 6.8 this am despite 1U PRBC overnight   Objective: Vital signs in last 24 hours: Temp:  [98.1 F (36.7 C)-99.7 F (37.6 C)] 98.8 F (37.1 C) (08/25 0705) Pulse Rate:  [93-128] 112 (08/25 0630) Resp:  [18-42] 21 (08/25 0630) BP: (87-185)/(56-162) 135/82 (08/25 0630) SpO2:  [88 %-100 %] 100 % (08/25 0630) Last BM Date: 12/04/20  Intake/Output from previous day: 08/24 0701 - 08/25 0700 In: 2260.6 [I.V.:905.9; Blood:315; IV Piggyback:1039.7] Out: 300 [Urine:300] Intake/Output this shift: Total I/O In: 30 [I.V.:30] Out: -   PE: Gen:  Alert, NAD, pleasant Card:  Tachycardic with regular rhythm Pulm:  Distant breath sounds at the bases. Clear to upper lung fields b/l w/o rales, rhonchi or wheezing. Normal rate and effort.  Abd: Soft, mild distension of the upper abdomen, NT, no peritonitis, +BS Psych: A&Ox2 Skin: no rashes noted, warm and dry  Lab Results:  Recent Labs    12/06/20 0038 12/06/20 0408  WBC 14.3* 12.5*  HGB 6.6* 6.8*  HCT 20.8* 20.4*  PLT 209 163   BMET Recent Labs    12/04/20 0559 12/05/20 0150 12/05/20 0416  NA 149* 151* 154*  K 3.6 3.3* 3.4*  CL 117* 119*  --   CO2 26 23  --   GLUCOSE 181* 160*  --   BUN 38* 25*  --   CREATININE 0.85 0.92  --   CALCIUM 8.2* 8.3*  --    PT/INR Recent Labs    12/05/20 1219  LABPROT 17.7*  INR 1.5*   CMP     Component Value Date/Time   NA 154 (H) 12/05/2020 0416   K 3.4 (L) 12/05/2020 0416   CL 119 (H) 12/05/2020 0150   CO2 23 12/05/2020 0150   GLUCOSE 160 (H) 12/05/2020 0150   BUN 25 (H) 12/05/2020 0150   CREATININE 0.92 12/05/2020 0150   CALCIUM 8.3 (L) 12/05/2020 0150   PROT 4.9 (L) 12/05/2020 0150    ALBUMIN 1.7 (L) 12/05/2020 0150   AST 37 12/05/2020 0150   ALT 41 12/05/2020 0150   ALKPHOS 64 12/05/2020 0150   BILITOT 1.6 (H) 12/05/2020 0150   GFRNONAA >60 12/05/2020 0150   Lipase     Component Value Date/Time   LIPASE 507 (H) 11/27/2020 0208    Studies/Results: CT Angio Chest Pulmonary Embolism (PE) W or WO Contrast  Result Date: 12/05/2020 CLINICAL DATA:  50 year old male with history of tachycardia and tachypnea. Pancreatitis. Abdominal distension. Fevers. EXAM: CT ANGIOGRAPHY CHEST CT ABDOMEN AND PELVIS WITH CONTRAST TECHNIQUE: Multidetector CT imaging of the chest was performed using the standard protocol during bolus administration of intravenous contrast. Multiplanar CT image reconstructions and MIPs were obtained to evaluate the vascular anatomy. Multidetector CT imaging of the abdomen and pelvis was performed using the standard protocol during bolus administration of intravenous contrast. CONTRAST:  100mL OMNIPAQUE IOHEXOL 350 MG/ML SOLN COMPARISON:  CT of the abdomen and pelvis 12/02/2020. Chest CT 11/26/2020. FINDINGS: CTA CHEST FINDINGS Cardiovascular: There are no filling defects within the pulmonary arterial tree to suggest pulmonary embolism. Heart size is normal. Trace amount of pericardial fluid and/or thickening, unlikely to be of hemodynamic significance at this  time. No associated pericardial calcification. There is aortic atherosclerosis, as well as atherosclerosis of the great vessels of the mediastinum and the coronary arteries, including calcified atherosclerotic plaque in the left main, left anterior descending, left circumflex and right coronary arteries. Mediastinum/Nodes: No pathologically enlarged mediastinal or hilar lymph nodes. Esophagus is unremarkable in appearance. No axillary lymphadenopathy. Lungs/Pleura: Resolving areas of airspace consolidation are noted throughout the lungs bilaterally, most evident in the lower lobes of the lungs bilaterally where  there continues to be extensive subsegmental atelectasis and some residual airspace disease. Trace bilateral pleural effusions lying dependently. Musculoskeletal: There are no aggressive appearing lytic or blastic lesions noted in the visualized portions of the skeleton. Review of the MIP images confirms the above findings. CT ABDOMEN and PELVIS FINDINGS Hepatobiliary: Diffuse low attenuation throughout the hepatic parenchyma, indicative of hepatic steatosis. No suspicious cystic or solid hepatic lesions. No intra or extrahepatic biliary ductal dilatation. Status post cholecystectomy. Pancreas: No discrete pancreatic mass confidently identified. There continues to be a small amount of peripancreatic fluid and extensive peripancreatic inflammatory changes indicative of acute pancreatitis. Portions of the pancreatic parenchyma do not enhance well, particularly in the body and tail of the pancreas, concerning for areas of potential pancreatic necrosis (although assessment is limited by suboptimal contrast bolus). Some of the peripancreatic fluid appears to be organizing into small pseudo cysts, largest of which is superior to the proximal pancreatic body (axial image 27 of series 6) measuring 3.8 x 2.0 cm. Spleen: Large subcapsular hematoma in the spleen common new compared to the prior study. Adrenals/Urinary Tract: Bilateral kidneys and adrenal glands are normal in appearance. No hydroureteronephrosis. Urinary bladder is nearly completely decompressed around an indwelling Foley balloon catheter. 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 Several dilated loops of small bowel are noted in the central abdomen measuring up to 4.8 cm in diameter, improved compared to the prior study. Oral contrast material on today's examination is noted throughout the distal small bowel and colon, indicating that small-bowel findings are more likely related to ileus than partial obstruction.  Rectal tube noted. Vascular/Lymphatic: Aortic atherosclerosis, without evidence of aneurysm or dissection in the abdominal or pelvic vasculature. No lymphadenopathy noted in the abdomen or pelvis. Reproductive: Status post hysterectomy. Ovaries are not confidently identified may be surgically absent or atrophic. Other: Tracking inferiorly from the spleen along the left pericolic gutter there is a moderate amount of high attenuation fluid, indicative of hemoperitoneum. Small to moderate volume of ascites elsewhere in the abdomen ranging from low to intermediate attenuation, presumably reflective of hemorrhagic contents. No pneumoperitoneum. Musculoskeletal: There are no aggressive appearing lytic or blastic lesions noted in the visualized portions of the skeleton. Review of the MIP images confirms the above findings. IMPRESSION: 1. New large subcapsular hematoma associated with the spleen. Adjacent to this there is hemorrhagic fluid in the peritoneal cavity (hemoperitoneum) tracking down the left pericolic gutter extending into the pelvis. Although active extravasation is not confidently identified on today's examination, this hematoma likely rapidly expanded, and the possibility of active bleeding should be considered. 2. Progression of pancreatitis with increasing inflammatory changes surrounding the pancreas and possible areas of hypoenhancement in the body and tail of the pancreas which could reflect developing pancreatic necrosis. Small peripancreatic pseudocysts are also noted. 3. No evidence of pulmonary embolism. 4. Resolving bilateral lower lobe pneumonia with residual areas of subsegmental atelectasis and a small amount of residual airspace consolidation. Trace bilateral parapneumonic pleural effusions. 5. Hepatic steatosis. 6. Aortic  atherosclerosis, in addition to left main and 3 vessel coronary artery disease. Please note that although the presence of coronary artery calcium documents the presence of  coronary artery disease, the severity of this disease and any potential stenosis cannot be assessed on this non-gated CT examination. Assessment for potential risk factor modification, dietary therapy or pharmacologic therapy may be warranted, if clinically indicated. 7. Additional incidental findings, as above. Critical Value/emergent results were called by telephone at the time of interpretation on 12/05/2020 at 11:31 am to provider Virl Diamond, who verbally acknowledged these results. Electronically Signed   By: Trudie Reed M.D.   On: 12/05/2020 11:34   CT ABDOMEN PELVIS W CONTRAST  Result Date: 12/05/2020 CLINICAL DATA:  50 year old male with history of tachycardia and tachypnea. Pancreatitis. Abdominal distension. Fevers. EXAM: CT ANGIOGRAPHY CHEST CT ABDOMEN AND PELVIS WITH CONTRAST TECHNIQUE: Multidetector CT imaging of the chest was performed using the standard protocol during bolus administration of intravenous contrast. Multiplanar CT image reconstructions and MIPs were obtained to evaluate the vascular anatomy. Multidetector CT imaging of the abdomen and pelvis was performed using the standard protocol during bolus administration of intravenous contrast. CONTRAST:  OMNIPAQUE IOHEXOL 350 MG/ML SOLN COMPARISON:  CT of the abdomen and pelvis 12/02/2020. Chest CT 11/26/2020. FINDINGS: CTA CHEST FINDINGS Cardiovascular: There are no filling defects within the pulmonary arterial tree to suggest pulmonary embolism. Heart size is normal. Trace amount of pericardial fluid and/or thickening, unlikely to be of hemodynamic significance at this time. No associated pericardial calcification. There is aortic atherosclerosis, as well as atherosclerosis of the great vessels of the mediastinum and the coronary arteries, including calcified atherosclerotic plaque in the left main, left anterior descending, left circumflex and right coronary arteries. Mediastinum/Nodes: No pathologically enlarged  mediastinal or hilar lymph nodes. Esophagus is unremarkable in appearance. No axillary lymphadenopathy. Lungs/Pleura: Resolving areas of airspace consolidation are noted throughout the lungs bilaterally, most evident in the lower lobes of the lungs bilaterally where there continues to be extensive subsegmental atelectasis and some residual airspace disease. Trace bilateral pleural effusions lying dependently. Musculoskeletal: There are no aggressive appearing lytic or blastic lesions noted in the visualized portions of the skeleton. Review of the MIP images confirms the above findings. CT ABDOMEN and PELVIS FINDINGS Hepatobiliary: Diffuse low attenuation throughout the hepatic parenchyma, indicative of hepatic steatosis. No suspicious cystic or solid hepatic lesions. No intra or extrahepatic biliary ductal dilatation. Status post cholecystectomy. Pancreas: No discrete pancreatic mass confidently identified. There continues to be a small amount of peripancreatic fluid and extensive peripancreatic inflammatory changes indicative of acute pancreatitis. Portions of the pancreatic parenchyma do not enhance well, particularly in the body and tail of the pancreas, concerning for areas of potential pancreatic necrosis (although assessment is limited by suboptimal contrast bolus). Some of the peripancreatic fluid appears to be organizing into small pseudo cysts, largest of which is superior to the proximal pancreatic body (axial image 27 of series 6) measuring 3.8 x 2.0 cm. Spleen: Large subcapsular hematoma in the spleen common new compared to the prior study. Adrenals/Urinary Tract: Bilateral kidneys and adrenal glands are normal in appearance. No hydroureteronephrosis. Urinary bladder is nearly completely decompressed around an indwelling Foley balloon catheter. 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 Several dilated loops of small bowel are noted in the central abdomen  measuring up to 4.8 cm in diameter, improved compared to the prior study. Oral contrast material on today's examination is noted throughout the distal small bowel and  colon, indicating that small-bowel findings are more likely related to ileus than partial obstruction. Rectal tube noted. Vascular/Lymphatic: Aortic atherosclerosis, without evidence of aneurysm or dissection in the abdominal or pelvic vasculature. No lymphadenopathy noted in the abdomen or pelvis. Reproductive: Status post hysterectomy. Ovaries are not confidently identified may be surgically absent or atrophic. Other: Tracking inferiorly from the spleen along the left pericolic gutter there is a moderate amount of high attenuation fluid, indicative of hemoperitoneum. Small to moderate volume of ascites elsewhere in the abdomen ranging from low to intermediate attenuation, presumably reflective of hemorrhagic contents. No pneumoperitoneum. Musculoskeletal: There are no aggressive appearing lytic or blastic lesions noted in the visualized portions of the skeleton. Review of the MIP images confirms the above findings. IMPRESSION: 1. New large subcapsular hematoma associated with the spleen. Adjacent to this there is hemorrhagic fluid in the peritoneal cavity (hemoperitoneum) tracking down the left pericolic gutter extending into the pelvis. Although active extravasation is not confidently identified on today's examination, this hematoma likely rapidly expanded, and the possibility of active bleeding should be considered. 2. Progression of pancreatitis with increasing inflammatory changes surrounding the pancreas and possible areas of hypoenhancement in the body and tail of the pancreas which could reflect developing pancreatic necrosis. Small peripancreatic pseudocysts are also noted. 3. No evidence of pulmonary embolism. 4. Resolving bilateral lower lobe pneumonia with residual areas of subsegmental atelectasis and a small amount of residual airspace  consolidation. Trace bilateral parapneumonic pleural effusions. 5. Hepatic steatosis. 6. Aortic atherosclerosis, in addition to left main and 3 vessel coronary artery disease. Please note that although the presence of coronary artery calcium documents the presence of coronary artery disease, the severity of this disease and any potential stenosis cannot be assessed on this non-gated CT examination. Assessment for potential risk factor modification, dietary therapy or pharmacologic therapy may be warranted, if clinically indicated. 7. Additional incidental findings, as above. Critical Value/emergent results were called by telephone at the time of interpretation on 12/05/2020 at 11:31 am to provider Virl Diamond, who verbally acknowledged these results. Electronically Signed   By: Trudie Reed M.D.   On: 12/05/2020 11:34   DG Chest Port 1 View  Result Date: 12/05/2020 CLINICAL DATA:  50 year old male with history of acute respiratory failure. Pneumonia. EXAM: PORTABLE CHEST 1 VIEW COMPARISON:  Chest x-ray 12/03/2020. FINDINGS: Lung volumes are low. There are some patchy bibasilar opacities in the medial aspect of the left lower lobe and lateral aspect of the right lower lobe. Possible trace right pleural effusion. No definite left pleural effusion. No pneumothorax. No evidence of pulmonary edema. Heart size is within normal limits allowing for low lung volumes and portable AP technique. The patient is rotated to the left on today's exam, resulting in distortion of the mediastinal contours and reduced diagnostic sensitivity and specificity for mediastinal pathology. IMPRESSION: 1. Low lung volumes with bibasilar opacities which may reflect areas of atelectasis and/or consolidation. Possible trace right pleural effusion. Electronically Signed   By: Trudie Reed M.D.   On: 12/05/2020 05:32   DG Abd Portable 1V  Result Date: 12/05/2020 CLINICAL DATA:  Shortness of breath.  Abdominal distention. EXAM:  PORTABLE ABDOMEN - 1 VIEW COMPARISON:  Earlier today FINDINGS: There is diffuse distention of the small and large bowel loops. The cecum measures approximately 12.8 cm. This is similar to previous exam. Small bowel loops measure up to 5.8 cm. Enteric contrast material identified within the distal descending colon and rectum. IMPRESSION: Persistent gaseous distention of the small  and large bowel loops compatible with ileus. Enteric contrast material is again noted within the distal colon. Electronically Signed   By: Signa Kell M.D.   On: 12/05/2020 14:32   [place] EKG SITE RITE  Result Date: 12/05/2020 If Site Rite image not attached, placement could not be confirmed due to current cardiac rhythm.   Anti-infectives: Anti-infectives (From admission, onward)    Start     Dose/Rate Route Frequency Ordered Stop   12/03/20 1115  cefTRIAXone (ROCEPHIN) 2 g in sodium chloride 0.9 % 100 mL IVPB        2 g 200 mL/hr over 30 Minutes Intravenous Every 24 hours 12/03/20 1016 12/09/20 2359   12/02/20 1500  linezolid (ZYVOX) IVPB 600 mg        600 mg 300 mL/hr over 60 Minutes Intravenous Every 12 hours 12/02/20 1407 12/07/20 2359   12/02/20 1028  vancomycin variable dose per unstable renal function (pharmacist dosing)  Status:  Discontinued         Does not apply See admin instructions 12/02/20 1028 12/02/20 1407   12/02/20 0000  vancomycin (VANCOREADY) IVPB 1500 mg/300 mL  Status:  Discontinued        1,500 mg 150 mL/hr over 120 Minutes Intravenous Every 12 hours 12/01/20 1126 12/02/20 1028   12/01/20 1215  vancomycin (VANCOREADY) IVPB 2000 mg/400 mL        2,000 mg 200 mL/hr over 120 Minutes Intravenous  Once 12/01/20 1123 12/01/20 1433   11/30/20 1200  Ampicillin-Sulbactam (UNASYN) 3 g in sodium chloride 0.9 % 100 mL IVPB  Status:  Discontinued        3 g 200 mL/hr over 30 Minutes Intravenous Every 6 hours 11/30/20 1055 12/02/20 1522        Assessment/Plan 50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 CT 8/24 w/ more of an ileus picture - oral contrast has passed into the distal small bowel and colon.  - Keep K > 4 and Mg > 2 for bowel function - Mobilize as able for bowel function - We will follow with you. Appreciate CCM's assistance. Discussed with their team   Subcapsular hematoma of the spleen ABL anemia  - Our team discussed with IR yesterday, please see separate note for more details. Patient with continued drop in hgb now requiring transfusion. HR ~100, BP stable and without hypotension. IR consult for embolization this AM. Discussed with patient and his wife.   FEN - NPO, IVF per CCM. On TPN. Consider small sips of clears after IR procedure, will discuss with MD. 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 CCM planning to touch base with GI Elevated LFT's - pending this am. Needs close monitoring, especially now that TNA has been initiated Aspiration pneumonitis with Staph aureus in sputum - on abx  Alcohol withdrawal - on thiamine and folic acid. CIWA.  Acute metabolic encephalopathy HTN CHF L rib fx's   LOS: 10 days    Jacinto Halim , [facility] 12/06/2020, 8:18 AM Please see Amion for pager number during day hours 7:00am-4:30pm

## 2020-12-06 NOTE — Consult Note (Signed)
Chief Complaint: Patient was seen in consultation today for splenic hemorrhage. Request is for intra abdominal arteriogram with possible embolization.    Referring Physician(s): Jettie Pagan PA  Supervising Physician: Richarda Overlie  Patient Status: [facility] - In-pt  History of Present Illness: Barron Vanloan is a 50 y.o. male inpatient. Former smoker. History of GERD, HTN, Etoh use, depression, obesity s/p Roux-en--Y, s/p cholecystectomy. Presented to the ED at [facility] on 8.15.22 with epigastric pain found to be acute pancreatitis and a left 7th rib fracture due to a recent fall.  On 8.18.22 Patient developed AMS with hypoxia where he was intubated. Patient was extubated on 8.23.22. Hospital stay further complicated with tachycardia, abdominal distention and fevers. CTA from 8.24.22 reads New large subcapsular hematoma associated with the spleen. Adjacent to this there is hemorrhagic fluid in the peritoneal cavity (hemoperitoneum) tracking down the left pericolic gutter extending into the pelvis. Although active extravasation is not confidently identified on today's examination, this hematoma likely rapidly expanded, and the possibility of active bleeding should be considered. Team is requesting an intra abdominal arteriogram with possible intervention.   Patient alert to person and place. and laying in bed. Wife at bedside. Patient denies abdominal pain, nausea, vomiting or bleeding. Wife at bedside states that he has had confusion since admission. Return precautions and treatment recommendations and follow-up discussed with the patient and his wife who are agreeable with the plan.    Past Medical History:  Diagnosis Date   Hypertension    Kidney stone     Past Surgical History:  Procedure Laterality Date   GASTRECTOMY      Allergies: Patient has no known allergies.  Medications: Prior to Admission medications   Medication Sig Start Date End Date Taking? Authorizing Provider  cyclobenzaprine  (FLEXERIL) 10 MG tablet Take 1 tablet (10 mg total) by mouth 2 (two) times daily as needed for muscle spasms. Patient taking differently: Take 10 mg by mouth every 12 (twelve) hours. 11/24/20  Yes Wurst, [place], PA-C  FLUoxetine (PROZAC) 20 MG tablet Take 20 mg by mouth every morning. 11/17/20  Yes [provider]  furosemide (LASIX) 20 MG tablet Take 20 mg by mouth every morning. 10/01/20  Yes [provider]  lisinopril (ZESTRIL) 10 MG tablet Take 10 mg by mouth daily. 03/30/20  Yes [provider]  metoprolol succinate (TOPROL-XL) 50 MG 24 hr tablet Take 50 mg by mouth daily. 08/25/20  Yes [provider]  omeprazole (PRILOSEC) 20 MG capsule Take 20 mg by mouth daily. 11/20/20  Yes [provider]  ondansetron (ZOFRAN) 8 MG tablet Take 8 mg by mouth every 8 (eight) hours as needed for nausea or vomiting. 07/14/20  Yes [provider]  predniSONE (STERAPRED UNI-PAK 21 TAB) 10 MG (21) TBPK tablet Take by mouth daily. Take 6 tabs by mouth daily  for 2 days, then 5 tabs for 2 days, then 4 tabs for 2 days, then 3 tabs for 2 days, 2 tabs for 2 days, then 1 tab by mouth daily for 2 days 11/24/20  Yes Wurst, [place], PA-C     History reviewed. No pertinent family history.  Social History   Socioeconomic History   Marital status: Married    Spouse name: Not on file   Number of children: Not on file   Years of education: Not on file   Highest education level: Not on file  Occupational History   Not on file  Tobacco Use   Smoking status: Never  Smokeless tobacco: Never  Substance and Sexual Activity   Alcohol use: Not on file   Drug use: Not on file   Sexual activity: Not on file  Other Topics Concern   Not on file  Social History Narrative   Not on file   Social Determinants of Health   Financial Resource Strain: Not on file  Food Insecurity: Not on file  Transportation Needs: Not on file  Physical Activity: Not on file  Stress: Not on  file  Social Connections: Not on file    Review of Systems: A 12 point ROS discussed and pertinent positives are indicated in the HPI above.  All other systems are negative.  Review of Systems  Constitutional:  Positive for fever.  HENT:  Negative for congestion.   Respiratory:  Negative for cough and shortness of breath.   Cardiovascular:  Negative for chest pain.  Gastrointestinal:  Positive for abdominal distention. Negative for abdominal pain.  Neurological:  Negative for headaches.  Psychiatric/Behavioral:  Positive for confusion (per wife at bedside.). Negative for behavioral problems.    Vital Signs: BP 135/82   Pulse (!) 112   Temp 98.8 F (37.1 C) (Core)   Resp (!) 21   Ht 5\' 10"  (1.778 m)   Wt 270 lb 4.5 oz (122.6 kg)   SpO2 100%   BMI 38.78 kg/m   Physical Exam Vitals and nursing note reviewed.  Constitutional:      Appearance: He is well-developed. He is ill-appearing.  HENT:     Head: Normocephalic.  Cardiovascular:     Rate and Rhythm: Normal rate.  Pulmonary:     Breath sounds: Normal breath sounds.     Comments: On o2 via [place] Abdominal:     General: There is distension.  Musculoskeletal:        General: Normal range of motion.     Cervical back: Normal range of motion.  Skin:    General: Skin is warm.  Neurological:     Mental Status: He is alert.     Comments: Oriented X 2    Imaging: CT ABDOMEN PELVIS WO CONTRAST  Result Date: 12/02/2020 CLINICAL DATA:  Bowel obstruction suspected Pancreatitis, persistent EXAM: CT ABDOMEN AND PELVIS WITHOUT CONTRAST TECHNIQUE: Multidetector CT imaging of the abdomen and pelvis was performed following the standard protocol without IV contrast. COMPARISON:  11/26/2020 FINDINGS: Lower chest: Bilateral lower lobe airspace opacities. Small left pleural effusion. Hepatobiliary: Prior cholecystectomy. Diffuse fatty infiltration of the liver. No focal hepatic abnormality. Pancreas: Inflammation/stranding around the  pancreas, slightly increased since prior study compatible with acute pancreatitis. No ductal dilatation. Spleen: No focal abnormality.  Normal size. Adrenals/Urinary Tract: No adrenal abnormality. No focal renal abnormality. No stones or hydronephrosis. Urinary bladder is unremarkable. Foley catheter within the bladder. Stomach/Bowel: Stomach and large bowel unremarkable. Small bowel is dilated into the pelvis. Distal small bowel is decompressed. Findings compatible with distal small bowel obstruction. Exact transition not visualized. Vascular/Lymphatic: No evidence of aneurysm or adenopathy. Reproductive: No visible focal abnormality. Other: Small amount of free fluid in the abdomen and pelvis. Musculoskeletal: No acute bony abnormality. IMPRESSION: Dilated small bowel loops into the pelvis with decompressed distal small bowel. Findings compatible with distal small bowel obstruction. Changes of acute pancreatitis, slightly worsening since prior study. Hepatic steatosis. Bilateral airspace opacity/consolidation. This could reflect atelectasis or pneumonia. Trace left effusion. Small amount of free fluid in the abdomen and pelvis. Electronically Signed   By: Charlett NoseKevin  Dover M.D.   On: 12/02/2020  15:00   DG Ribs Unilateral W/Chest Left  Result Date: 11/26/2020 CLINICAL DATA:  Left-sided rib pain for the past 2 days. EXAM: LEFT RIBS AND CHEST - 3+ VIEW COMPARISON:  None. FINDINGS: Acute nondisplaced fractures of the left anterior sixth through eighth ribs. The heart size and mediastinal contours are within normal limits. No focal consolidation, pleural effusion, or pneumothorax. IMPRESSION: 1. Acute nondisplaced fractures of the left anterior sixth through eighth ribs. Electronically Signed   By: Obie Dredge M.D.   On: 11/26/2020 10:07   DG Abd 1 View  Result Date: 11/29/2020 CLINICAL DATA:  Check gastric catheter placement EXAM: ABDOMEN - 1 VIEW COMPARISON:  Film from earlier in the same day. FINDINGS:  Gastric catheter is now visualized within the stomach. Scattered large and small bowel gas is noted. No obstructive changes are seen. IMPRESSION: Gastric catheter within the stomach. Electronically Signed   By: Alcide Clever M.D.   On: 11/29/2020 17:16   DG Abd 1 View  Result Date: 11/29/2020 CLINICAL DATA:  Abdominal pain. EXAM: ABDOMEN - 1 VIEW COMPARISON:  Abdominal x-ray from yesterday. FINDINGS: The bowel gas pattern is normal. No radio-opaque calculi or other significant radiographic abnormality are seen. Postsurgical changes in the left abdomen. IMPRESSION: 1. Negative. Electronically Signed   By: Obie Dredge M.D.   On: 11/29/2020 10:11   DG Abd 1 View  Result Date: 11/28/2020 CLINICAL DATA:  Abdominal pain. EXAM: ABDOMEN - 1 VIEW COMPARISON:  None. FINDINGS: Mild gaseous distension scattered small bowel loops and transverse colon. Gas is seen in the rectum. No unexpected radiopaque calculi. IMPRESSION: Mild gaseous distention of scattered small bowel loops and colon can be seen with an ileus. Electronically Signed   By: Leanna Battles M.D.   On: 11/28/2020 15:17   CT HEAD WO CONTRAST ( )  Result Date: 11/29/2020 CLINICAL DATA:  Anisocoria. EXAM: CT HEAD WITHOUT CONTRAST TECHNIQUE: Contiguous axial images were obtained from the base of the skull through the vertex without intravenous contrast. COMPARISON:  No pertinent prior exams available for comparison. FINDINGS: Brain: Cerebral volume is normal. There is no acute intracranial hemorrhage. No demarcated cortical infarct. No extra-axial fluid collection. No evidence of an intracranial mass. No midline shift. Vascular: No hyperdense vessel.  Atherosclerotic calcifications. Skull: Normal. Negative for fracture or focal lesion. Sinuses/Orbits: Visualized orbits show no acute finding. 18 mm left maxillary sinus mucous retention cyst. IMPRESSION: No evidence of acute intracranial abnormality. 18 mm left maxillary sinus mucous retention cyst.  Electronically Signed   By: Jackey Loge D.O.   On: 11/29/2020 16:57   CT Angio Chest Pulmonary Embolism (PE) W or WO Contrast  Result Date: 12/05/2020 CLINICAL DATA:  50 year old male with history of tachycardia and tachypnea. Pancreatitis. Abdominal distension. Fevers. EXAM: CT ANGIOGRAPHY CHEST CT ABDOMEN AND PELVIS WITH CONTRAST TECHNIQUE: Multidetector CT imaging of the chest was performed using the standard protocol during bolus administration of intravenous contrast. Multiplanar CT image reconstructions and MIPs were obtained to evaluate the vascular anatomy. Multidetector CT imaging of the abdomen and pelvis was performed using the standard protocol during bolus administration of intravenous contrast. CONTRAST:  OMNIPAQUE IOHEXOL 350 MG/ML SOLN COMPARISON:  CT of the abdomen and pelvis 12/02/2020. Chest CT 11/26/2020. FINDINGS: CTA CHEST FINDINGS Cardiovascular: There are no filling defects within the pulmonary arterial tree to suggest pulmonary embolism. Heart size is normal. Trace amount of pericardial fluid and/or thickening, unlikely to be of hemodynamic significance at this time. No associated pericardial calcification. There is aortic  atherosclerosis, as well as atherosclerosis of the great vessels of the mediastinum and the coronary arteries, including calcified atherosclerotic plaque in the left main, left anterior descending, left circumflex and right coronary arteries. Mediastinum/Nodes: No pathologically enlarged mediastinal or hilar lymph nodes. Esophagus is unremarkable in appearance. No axillary lymphadenopathy. Lungs/Pleura: Resolving areas of airspace consolidation are noted throughout the lungs bilaterally, most evident in the lower lobes of the lungs bilaterally where there continues to be extensive subsegmental atelectasis and some residual airspace disease. Trace bilateral pleural effusions lying dependently. Musculoskeletal: There are no aggressive appearing lytic or blastic  lesions noted in the visualized portions of the skeleton. Review of the MIP images confirms the above findings. CT ABDOMEN and PELVIS FINDINGS Hepatobiliary: Diffuse low attenuation throughout the hepatic parenchyma, indicative of hepatic steatosis. No suspicious cystic or solid hepatic lesions. No intra or extrahepatic biliary ductal dilatation. Status post cholecystectomy. Pancreas: No discrete pancreatic mass confidently identified. There continues to be a small amount of peripancreatic fluid and extensive peripancreatic inflammatory changes indicative of acute pancreatitis. Portions of the pancreatic parenchyma do not enhance well, particularly in the body and tail of the pancreas, concerning for areas of potential pancreatic necrosis (although assessment is limited by suboptimal contrast bolus). Some of the peripancreatic fluid appears to be organizing into small pseudo cysts, largest of which is superior to the proximal pancreatic body (axial image 27 of series 6) measuring 3.8 x 2.0 cm. Spleen: Large subcapsular hematoma in the spleen common new compared to the prior study. Adrenals/Urinary Tract: Bilateral kidneys and adrenal glands are normal in appearance. No hydroureteronephrosis. Urinary bladder is nearly completely decompressed around an indwelling Foley balloon catheter. 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 Several dilated loops of small bowel are noted in the central abdomen measuring up to 4.8 cm in diameter, improved compared to the prior study. Oral contrast material on today's examination is noted throughout the distal small bowel and colon, indicating that small-bowel findings are more likely related to ileus than partial obstruction. Rectal tube noted. Vascular/Lymphatic: Aortic atherosclerosis, without evidence of aneurysm or dissection in the abdominal or pelvic vasculature. No lymphadenopathy noted in the abdomen or pelvis. Reproductive:  Status post hysterectomy. Ovaries are not confidently identified may be surgically absent or atrophic. Other: Tracking inferiorly from the spleen along the left pericolic gutter there is a moderate amount of high attenuation fluid, indicative of hemoperitoneum. Small to moderate volume of ascites elsewhere in the abdomen ranging from low to intermediate attenuation, presumably reflective of hemorrhagic contents. No pneumoperitoneum. Musculoskeletal: There are no aggressive appearing lytic or blastic lesions noted in the visualized portions of the skeleton. Review of the MIP images confirms the above findings. IMPRESSION: 1. New large subcapsular hematoma associated with the spleen. Adjacent to this there is hemorrhagic fluid in the peritoneal cavity (hemoperitoneum) tracking down the left pericolic gutter extending into the pelvis. Although active extravasation is not confidently identified on today's examination, this hematoma likely rapidly expanded, and the possibility of active bleeding should be considered. 2. Progression of pancreatitis with increasing inflammatory changes surrounding the pancreas and possible areas of hypoenhancement in the body and tail of the pancreas which could reflect developing pancreatic necrosis. Small peripancreatic pseudocysts are also noted. 3. No evidence of pulmonary embolism. 4. Resolving bilateral lower lobe pneumonia with residual areas of subsegmental atelectasis and a small amount of residual airspace consolidation. Trace bilateral parapneumonic pleural effusions. 5. Hepatic steatosis. 6. Aortic atherosclerosis, in addition to left main and 3  vessel coronary artery disease. Please note that although the presence of coronary artery calcium documents the presence of coronary artery disease, the severity of this disease and any potential stenosis cannot be assessed on this non-gated CT examination. Assessment for potential risk factor modification, dietary therapy or  pharmacologic therapy may be warranted, if clinically indicated. 7. Additional incidental findings, as above. Critical Value/emergent results were called by telephone at the time of interpretation on 12/05/2020 at 11:31 am to provider Virl Diamond, who verbally acknowledged these results. Electronically Signed   By: Trudie Reed M.D.   On: 12/05/2020 11:34   CT ABDOMEN PELVIS W CONTRAST  Result Date: 12/05/2020 CLINICAL DATA:  50 year old male with history of tachycardia and tachypnea. Pancreatitis. Abdominal distension. Fevers. EXAM: CT ANGIOGRAPHY CHEST CT ABDOMEN AND PELVIS WITH CONTRAST TECHNIQUE: Multidetector CT imaging of the chest was performed using the standard protocol during bolus administration of intravenous contrast. Multiplanar CT image reconstructions and MIPs were obtained to evaluate the vascular anatomy. Multidetector CT imaging of the abdomen and pelvis was performed using the standard protocol during bolus administration of intravenous contrast. CONTRAST:  OMNIPAQUE IOHEXOL 350 MG/ML SOLN COMPARISON:  CT of the abdomen and pelvis 12/02/2020. Chest CT 11/26/2020. FINDINGS: CTA CHEST FINDINGS Cardiovascular: There are no filling defects within the pulmonary arterial tree to suggest pulmonary embolism. Heart size is normal. Trace amount of pericardial fluid and/or thickening, unlikely to be of hemodynamic significance at this time. No associated pericardial calcification. There is aortic atherosclerosis, as well as atherosclerosis of the great vessels of the mediastinum and the coronary arteries, including calcified atherosclerotic plaque in the left main, left anterior descending, left circumflex and right coronary arteries. Mediastinum/Nodes: No pathologically enlarged mediastinal or hilar lymph nodes. Esophagus is unremarkable in appearance. No axillary lymphadenopathy. Lungs/Pleura: Resolving areas of airspace consolidation are noted throughout the lungs bilaterally, most  evident in the lower lobes of the lungs bilaterally where there continues to be extensive subsegmental atelectasis and some residual airspace disease. Trace bilateral pleural effusions lying dependently. Musculoskeletal: There are no aggressive appearing lytic or blastic lesions noted in the visualized portions of the skeleton. Review of the MIP images confirms the above findings. CT ABDOMEN and PELVIS FINDINGS Hepatobiliary: Diffuse low attenuation throughout the hepatic parenchyma, indicative of hepatic steatosis. No suspicious cystic or solid hepatic lesions. No intra or extrahepatic biliary ductal dilatation. Status post cholecystectomy. Pancreas: No discrete pancreatic mass confidently identified. There continues to be a small amount of peripancreatic fluid and extensive peripancreatic inflammatory changes indicative of acute pancreatitis. Portions of the pancreatic parenchyma do not enhance well, particularly in the body and tail of the pancreas, concerning for areas of potential pancreatic necrosis (although assessment is limited by suboptimal contrast bolus). Some of the peripancreatic fluid appears to be organizing into small pseudo cysts, largest of which is superior to the proximal pancreatic body (axial image 27 of series 6) measuring 3.8 x 2.0 cm. Spleen: Large subcapsular hematoma in the spleen common new compared to the prior study. Adrenals/Urinary Tract: Bilateral kidneys and adrenal glands are normal in appearance. No hydroureteronephrosis. Urinary bladder is nearly completely decompressed around an indwelling Foley balloon catheter. 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 Several dilated loops of small bowel are noted in the central abdomen measuring up to 4.8 cm in diameter, improved compared to the prior study. Oral contrast material on today's examination is noted throughout the distal small bowel and colon, indicating that small-bowel findings  are more likely  related to ileus than partial obstruction. Rectal tube noted. Vascular/Lymphatic: Aortic atherosclerosis, without evidence of aneurysm or dissection in the abdominal or pelvic vasculature. No lymphadenopathy noted in the abdomen or pelvis. Reproductive: Status post hysterectomy. Ovaries are not confidently identified may be surgically absent or atrophic. Other: Tracking inferiorly from the spleen along the left pericolic gutter there is a moderate amount of high attenuation fluid, indicative of hemoperitoneum. Small to moderate volume of ascites elsewhere in the abdomen ranging from low to intermediate attenuation, presumably reflective of hemorrhagic contents. No pneumoperitoneum. Musculoskeletal: There are no aggressive appearing lytic or blastic lesions noted in the visualized portions of the skeleton. Review of the MIP images confirms the above findings. IMPRESSION: 1. New large subcapsular hematoma associated with the spleen. Adjacent to this there is hemorrhagic fluid in the peritoneal cavity (hemoperitoneum) tracking down the left pericolic gutter extending into the pelvis. Although active extravasation is not confidently identified on today's examination, this hematoma likely rapidly expanded, and the possibility of active bleeding should be considered. 2. Progression of pancreatitis with increasing inflammatory changes surrounding the pancreas and possible areas of hypoenhancement in the body and tail of the pancreas which could reflect developing pancreatic necrosis. Small peripancreatic pseudocysts are also noted. 3. No evidence of pulmonary embolism. 4. Resolving bilateral lower lobe pneumonia with residual areas of subsegmental atelectasis and a small amount of residual airspace consolidation. Trace bilateral parapneumonic pleural effusions. 5. Hepatic steatosis. 6. Aortic atherosclerosis, in addition to left main and 3 vessel coronary artery disease. Please note that although the  presence of coronary artery calcium documents the presence of coronary artery disease, the severity of this disease and any potential stenosis cannot be assessed on this non-gated CT examination. Assessment for potential risk factor modification, dietary therapy or pharmacologic therapy may be warranted, if clinically indicated. 7. Additional incidental findings, as above. Critical Value/emergent results were called by telephone at the time of interpretation on 12/05/2020 at 11:31 am to provider Virl Diamond, who verbally acknowledged these results. Electronically Signed   By: Trudie Reed M.D.   On: 12/05/2020 11:34   CT CHEST ABDOMEN PELVIS W CONTRAST  Result Date: 11/26/2020 CLINICAL DATA:  Left-sided rib pain for the past 2 days.  Trauma. EXAM: CT CHEST, ABDOMEN, AND PELVIS WITH CONTRAST TECHNIQUE: Multidetector CT imaging of the chest, abdomen and pelvis was performed following the standard protocol during bolus administration of intravenous contrast. CONTRAST:  OMNIPAQUE IOHEXOL 350 MG/ML SOLN COMPARISON:  None. FINDINGS: CHEST: Ports and Devices: None. Lungs/airways: No focal consolidation. Couple scattered pulmonary micronodules. No pulmonary mass. No pulmonary contusion or laceration. No pneumatocele formation. The central airways are patent. Pleura: No pleural effusion. No pneumothorax. No hemothorax. Lymph Nodes: No mediastinal, hilar, or axillary lymphadenopathy. Mediastinum: No pneumomediastinum. No aortic injury or mediastinal hematoma. The thoracic aorta is normal in caliber. The heart is normal in size. No significant pericardial effusion. At least 2 vessel coronary calcifications. The esophagus is unremarkable. The thyroid is unremarkable. Chest Wall / Breasts: No chest wall mass. Musculoskeletal: Acute nondisplaced left seventh rib fracture. No sternal fracture. No spinal fracture. ABDOMEN / PELVIS: Liver: The hepatic parenchyma is diffusely hypodense compared to the splenic  parenchyma consistent with fatty infiltration. No focal liver abnormality. No laceration or subcapsular hematoma. Biliary System: The gallbladder is otherwise unremarkable with no radio-opaque gallstones. No biliary ductal dilatation. Pancreas: Hazy contour of the pancreatic parenchyma diffusely with associated peripancreatic fat stranding and free fluid. No main pancreatic duct dilatation. No pseudocyst formation. Spleen:  Not enlarged. No focal lesion. No laceration, subcapsular hematoma, or vascular injury. Adrenal Glands: No nodularity bilaterally. Kidneys: Bilateral kidneys enhance symmetrically. No hydronephrosis. No contusion, laceration, or subcapsular hematoma. No injury to the vascular structures or collecting systems. No hydroureter. The urinary bladder is unremarkable. On delayed imaging, there is no urothelial wall thickening and there are no filling defects in the opacified portions of the bilateral collecting systems or ureters. Bowel: Roux-en-Y gastric surgical changes identified. Haziness of the duodenal wall with associated surrounding fat stranding. No large bowel wall thickening or dilatation. Few scattered colonic diverticula. Mesentery, Omentum, and Peritoneum: No simple free fluid ascites. No pneumoperitoneum. No hemoperitoneum. No mesenteric hematoma identified. No organized fluid collection. Pelvic Organs: Normal. Lymph Nodes: No abdominal, pelvic, inguinal lymphadenopathy. Vasculature: Mild atherosclerotic plaque. No abdominal aorta or iliac aneurysm. No active contrast extravasation or pseudoaneurysm. Musculoskeletal: No significant soft tissue hematoma. No acute pelvic fracture. No spinal fracture. IMPRESSION: 1. Acute pancreatitis. 2. Likely associated reactive changes of duodenum. 3. Hepatic steatosis. 4. Acute nondisplaced left seventh rib fracture. 5. Otherwise: No acute traumatic injury to the chest, abdomen, or pelvis. No acute fracture or traumatic malalignment of the thoracic or  lumbar spine. 6. Other imaging findings of potential clinical significance: Status post cholecystectomy and gastric surgical changes. Scattered colonic diverticulosis with no acute diverticulitis. Aortic Atherosclerosis (ICD10-I70.0). Electronically Signed   By: Tish Frederickson M.D.   On: 11/26/2020 16:15   DG Chest Port 1 View  Result Date: 12/05/2020 CLINICAL DATA:  50 year old male with history of acute respiratory failure. Pneumonia. EXAM: PORTABLE CHEST 1 VIEW COMPARISON:  Chest x-ray 12/03/2020. FINDINGS: Lung volumes are low. There are some patchy bibasilar opacities in the medial aspect of the left lower lobe and lateral aspect of the right lower lobe. Possible trace right pleural effusion. No definite left pleural effusion. No pneumothorax. No evidence of pulmonary edema. Heart size is within normal limits allowing for low lung volumes and portable AP technique. The patient is rotated to the left on today's exam, resulting in distortion of the mediastinal contours and reduced diagnostic sensitivity and specificity for mediastinal pathology. IMPRESSION: 1. Low lung volumes with bibasilar opacities which may reflect areas of atelectasis and/or consolidation. Possible trace right pleural effusion. Electronically Signed   By: Trudie Reed M.D.   On: 12/05/2020 05:32   DG Chest Port 1 View  Result Date: 12/03/2020 CLINICAL DATA:  Hypertension, respiratory failure, nephrolithiasis EXAM: PORTABLE CHEST - 1 VIEW COMPARISON:  the previous day's study FINDINGS: Endotracheal tube and nasogastric tube remain in place. Relatively low lung volumes with infrahilar consolidation/atelectasis, left greater than right as before. Heart size and mediastinal contours are within normal limits. No effusion. No definite pneumothorax although the apices are partially excluded. Visualized bones unremarkable.  Cholecystectomy clips. IMPRESSION: Stable infrahilar consolidation/atelectasis, left greater than right. Support  hardware stable in position. Electronically Signed   By: Corlis Leak M.D.   On: 12/03/2020 07:43   DG Chest Port 1 View  Result Date: 12/02/2020 CLINICAL DATA:  Respiratory failure EXAM: PORTABLE CHEST 1 VIEW COMPARISON:  11/29/2020 FINDINGS: Endotracheal tube terminates 2.6 cm above carina. Nasogastric tube terminates in the body of the stomach with the side port likely at the gastroesophageal junction. Apical lordotic positioning. Cardiomegaly accentuated by AP portable technique. No pleural effusion or pneumothorax. No congestive failure. Low lung volumes with resultant pulmonary interstitial prominence. Mild bibasilar atelectasis. IMPRESSION: Cardiomegaly and low lung volumes, without acute disease. Nasogastric tube with side port likely at the level  of the gastroesophageal junction. Consider advancement. Electronically Signed   By: Jeronimo Greaves M.D.   On: 12/02/2020 11:45   Portable Chest x-ray  Result Date: 11/29/2020 CLINICAL DATA:  Intubation. EXAM: PORTABLE CHEST 1 VIEW COMPARISON:  Chest x-ray from same day at 0746 hours. FINDINGS: Interval intubation with the endotracheal tube tip 2.2 cm above the carina. New enteric tube entering the stomach with the tip below the field of view. Bilateral hazy and indistinct pulmonary opacities have mildly improved. No pleural effusion or pneumothorax. IMPRESSION: 1. Interval intubation with endotracheal tube tip 2.2 cm above the carina. 2. New enteric tube entering the stomach with the tip below the field of view. 3. Mildly improved pulmonary opacities since earlier today, favoring resolving edema. Electronically Signed   By: Obie Dredge M.D.   On: 11/29/2020 10:10   DG Chest Port 1 View  Result Date: 11/29/2020 CLINICAL DATA:  Acute respiratory distress EXAM: PORTABLE CHEST 1 VIEW COMPARISON:  This yesterday FINDINGS: Low volume chest with bilateral pulmonary opacity that is indistinct. Cardiomegaly and probable vascular pedicle widening. No visible  effusion or pneumothorax. IMPRESSION: Worsening aeration which could be from edema or infiltrates. Electronically Signed   By: Marnee Spring M.D.   On: 11/29/2020 07:56   DG Chest Port 1 View  Result Date: 11/28/2020 CLINICAL DATA:  Dyspnea EXAM: PORTABLE CHEST 1 VIEW COMPARISON:  11/26/2020 FINDINGS: Low lung volumes. Subsegmental atelectasis left base. Borderline cardiac size. No pneumothorax IMPRESSION: No active disease. Low lung volume with subsegmental atelectasis at the left base Electronically Signed   By: Jasmine Pang M.D.   On: 11/28/2020 21:43   DG Abd Portable 1V  Result Date: 12/05/2020 CLINICAL DATA:  Shortness of breath.  Abdominal distention. EXAM: PORTABLE ABDOMEN - 1 VIEW COMPARISON:  Earlier today FINDINGS: There is diffuse distention of the small and large bowel loops. The cecum measures approximately 12.8 cm. This is similar to previous exam. Small bowel loops measure up to 5.8 cm. Enteric contrast material identified within the distal descending colon and rectum. IMPRESSION: Persistent gaseous distention of the small and large bowel loops compatible with ileus. Enteric contrast material is again noted within the distal colon. Electronically Signed   By: Signa Kell M.D.   On: 12/05/2020 14:32   DG Abd Portable 1V  Result Date: 12/04/2020 CLINICAL DATA:  Small boxer per EXAM: PORTABLE ABDOMEN - 1 VIEW COMPARISON:  Mold KUB, most recently 12/03/2020. CT pelvis, 12/02/2020. FINDINGS: Support lines: Nasogastric tube, with tip unchanged and projecting within small bowel status post gastric bypass. Foley catheter, with temperature sensing probe. Similar appearance of multiple air-and-fluid distended central small bowel loops, greatest at the RIGHT lower quadrant and measuring up to 10.0 cm. Cholecystectomy clips. Surgical clips additional staple line in surgical clips at the epigastrium, consistent with known gastric bypass. No osseous abnormality. IMPRESSION: 1. Lines and tubes are  unchanged in position. 2. Persistently dilated bowel loops, consistent with known bowel obstruction. Electronically Signed   By: Roanna Banning M.D.   On: 12/04/2020 08:09   DG Abd Portable 1V-Small Bowel Obstruction Protocol-initial, 8 hr delay  Result Date: 12/03/2020 CLINICAL DATA:  Small-bowel obstruction EXAM: PORTABLE ABDOMEN - 1 VIEW COMPARISON:  Same day abdominal radiographs, CT abdomen/pelvis 1 day prior FINDINGS: The enteric catheter tip projects over the mid abdomen likely in the small bowel status post gastric bypass. Multiple dilated loops of bowel are again seen throughout the abdomen, the largest in the right lower quadrant measuring up to 10.0  cm, overall unchanged since the radiographs obtained earlier the same day. IMPRESSION: No significant interval change compared to the KUB obtained earlier the same day. Electronically Signed   By: Lesia Hausen M.D.   On: 12/03/2020 19:35   DG Abd Portable 1V  Result Date: 12/03/2020 CLINICAL DATA:  Respiratory failure. SBO. Best obtainable images EXAM: PORTABLE ABDOMEN - 1 VIEW COMPARISON:  CT from previous day FINDINGS: Nasogastric tube into partially decompressed stomach. Several dilated gas-filled loops of small bowel predominantly in the left mid abdomen. Gas distended cecum in the right mid abdomen as before. The remainder of the colon is decompressed. Cholecystectomy clips. Surgical staples in the left upper and mid abdomen. No abnormal abdominal calcifications. IMPRESSION: Little convincing interval change in bowel gas pattern since previous CT, suggesting partial small bowel obstruction. Electronically Signed   By: Corlis Leak M.D.   On: 12/03/2020 07:49   DG Abd Portable 1V  Result Date: 12/01/2020 CLINICAL DATA:  Ileus.  Pancreatitis. EXAM: PORTABLE ABDOMEN - 1 VIEW COMPARISON:  11/29/2020 and older exams.  CT, 11/26/2020. FINDINGS: There is increased bowel gas with mild dilation of small bowel and colon, similar to the exam from 11/29/2020.  Nasogastric tube tip projects in the mid stomach, stable. Stable surgical vascular clips in the left mid abdomen and right upper quadrant. IMPRESSION: 1. Increased bowel gas in mild bowel dilation consistent with an adynamic ileus, similar to the most recent prior study. Electronically Signed   By: Amie Portland M.D.   On: 12/01/2020 12:17   ECHOCARDIOGRAM COMPLETE  Result Date: 11/28/2020    ECHOCARDIOGRAM REPORT   Patient Name:   ELLIAS MCELREATH Date of Exam: 11/28/2020 Medical Rec #:  [id number]   Height:       70.0 in Accession #:    [id number]  Weight:       247.0 lb Date of Birth:  January 28, 1971   BSA:          2.283 m Patient Age:    50 years    BP:           148/96 mmHg Patient Gender: M           HR:           100 bpm. Exam Location:  Inpatient Procedure: 2D Echo, Cardiac Doppler and Color Doppler Indications:    Congenital Heart Disease. Q24.0  History:        Patient has no prior history of Echocardiogram examinations.                 Risk Factors:Hypertension.  Sonographer:    Tiffany Dance RVT Referring Phys: [id number] TODD D MCDIARMID  Sonographer Comments: No subcostal window. IMPRESSIONS  1. Left ventricular ejection fraction, by estimation, is 60 to 65%. The left ventricle has normal function. The left ventricle has no regional wall motion abnormalities. There is mild concentric left ventricular hypertrophy. Left ventricular diastolic parameters are consistent with Grade I diastolic dysfunction (impaired relaxation).  2. Right ventricular systolic function is normal. The right ventricular size is normal.  3. The mitral valve is normal in structure. No evidence of mitral valve regurgitation. No evidence of mitral stenosis.  4. The aortic valve is tricuspid. Aortic valve regurgitation is trivial. No aortic stenosis is present.  5. Aortic dilatation noted. There is mild dilatation of the aortic root, measuring 41 mm.  6. The inferior vena cava is normal in size with greater than 50% respiratory variability,  suggesting right atrial pressure of 3  mmHg. FINDINGS  Left Ventricle: Left ventricular ejection fraction, by estimation, is 60 to 65%. The left ventricle has normal function. The left ventricle has no regional wall motion abnormalities. The left ventricular internal cavity size was normal in size. There is  mild concentric left ventricular hypertrophy. Left ventricular diastolic parameters are consistent with Grade I diastolic dysfunction (impaired relaxation). Normal left ventricular filling pressure. Right Ventricle: The right ventricular size is normal. No increase in right ventricular wall thickness. Right ventricular systolic function is normal. Left Atrium: Left atrial size was normal in size. Right Atrium: Right atrial size was normal in size. Pericardium: There is no evidence of pericardial effusion. Mitral Valve: The mitral valve is normal in structure. No evidence of mitral valve regurgitation. No evidence of mitral valve stenosis. Tricuspid Valve: The tricuspid valve is normal in structure. Tricuspid valve regurgitation is not demonstrated. No evidence of tricuspid stenosis. Aortic Valve: The aortic valve is tricuspid. Aortic valve regurgitation is trivial. No aortic stenosis is present. Pulmonic Valve: The pulmonic valve was normal in structure. Pulmonic valve regurgitation is not visualized. No evidence of pulmonic stenosis. Aorta: Aortic dilatation noted. There is mild dilatation of the aortic root, measuring 41 mm. Venous: The inferior vena cava is normal in size with greater than 50% respiratory variability, suggesting right atrial pressure of 3 mmHg. IAS/Shunts: No atrial level shunt detected by color flow Doppler.  LEFT VENTRICLE PLAX 2D LVIDd:         4.60 cm  Diastology LVIDs:         3.60 cm  LV e' medial:    7.83 cm/s LV PW:         1.16 cm  LV E/e' medial:  6.1 LV IVS:        1.20 cm  LV e' lateral:   8.27 cm/s LVOT diam:     2.30 cm  LV E/e' lateral: 5.8 LV SV:         91 LV SV Index:   40  LVOT Area:     4.15 cm  RIGHT VENTRICLE RV Basal diam:  2.10 cm RV S prime:     11.90 cm/s TAPSE (M-mode): 2.1 cm LEFT ATRIUM             Index       RIGHT ATRIUM           Index LA diam:        2.70 cm 1.18 cm/m  RA Area:     14.80 cm LA Vol (A2C):   53.6 ml 23.48 ml/m RA Volume:   31.50 ml  13.80 ml/m LA Vol (A4C):   44.2 ml 19.36 ml/m LA Biplane Vol: 50.6 ml 22.16 ml/m  AORTIC VALVE LVOT Vmax:   121.00 cm/s LVOT Vmean:  82.500 cm/s LVOT VTI:    0.220 m  AORTA Ao Root diam: 4.30 cm Ao Asc diam:  4.10 cm MITRAL VALVE MV Area (PHT): 3.93 cm    SHUNTS MV Decel Time: 193 msec    Systemic VTI:  0.22 m MV E velocity: 47.60 cm/s  Systemic Diam: 2.30 cm MV A velocity: 70.60 cm/s MV E/A ratio:  0.67 Chilton Si MD Electronically signed by Chilton Si MD Signature Date/Time: 11/28/2020/4:09:03 PM    Final    [place] EKG SITE RITE  Result Date: 12/05/2020 If Site Rite image not attached, placement could not be confirmed due to current cardiac rhythm.   Labs:  CBC: Recent Labs    12/05/20 1219 12/05/20 1813  12/05/20 2331 12/06/20 0038 12/06/20 0408  WBC 14.8* 13.4*  --  14.3* 12.5*  HGB 8.7* 7.4* 7.1* 6.6* 6.8*  HCT 27.4* 22.7* 22.2* 20.8* 20.4*  PLT 209 220  --  209 163    COAGS: Recent Labs    11/30/20 1134 12/01/20 0016 12/05/20 1219  INR 1.2 1.2 1.5*    BMP: Recent Labs    12/03/20 0113 12/04/20 0559 12/05/20 0150 12/05/20 0416 12/06/20 0730  NA 148* 149* 151* 154* 151*  K 4.0 3.6 3.3* 3.4* 3.2*  CL 113* 117* 119*  --  119*  CO2 21* 26 23  --  23  GLUCOSE 151* 181* 160*  --  181*  BUN 58* 38* 25*  --  35*  CALCIUM 8.0* 8.2* 8.3*  --  8.1*  CREATININE 2.22* 0.85 0.92  --  0.87  GFRNONAA 35* >60 >60  --  >60    LIVER FUNCTION TESTS: Recent Labs    12/03/20 0113 12/04/20 0559 12/05/20 0150 12/06/20 0730  BILITOT 1.8* 1.5* 1.6* 1.7*  AST 48* 35 37 43*  ALT 63* 45* 41 29  ALKPHOS 63 58 64 59  PROT 5.3* 4.9* 4.9* 5.1*  ALBUMIN 1.8* 1.5* 1.7* 2.4*       Assessment and Plan: 50 y.o.  male inpatient. Former smoker. History of GERD, HTN, Etoh use, depression, obesity s/p Roux-en--Y, s/p cholecystectomy. [facility] ED at [facility] on 8.15.22 with epigastric pain found to be acute pancreatitis and a left 7th rib fracture due to a recent fall.  On 8.18.22 Patient developed AMS with hypoxia where he was intubated. Patient was extubated on 8.23.22. Hospital stay further complicated with tachycardia, abdominal distention and fevers. CTA from 8.24.22 reads New large subcapsular hematoma associated with the spleen. Adjacent to this there is hemorrhagic fluid in the peritoneal cavity (hemoperitoneum) tracking down the left pericolic gutter extending into the pelvis. Although active extravasation is not confidently identified on today's examination, this hematoma likely rapidly expanded, and the possibility of active bleeding should be considered. Team is requesting an intra abdominal arteriogram with possible intervention.    Mr Bezek received 1 unit of PRBC on 8.25.22 at 2:14. Hgb 6.8 >> 6.6.>> 7.1>> 7.1>>7.4. Hemoglobin was 14.6 upon admission, HCT 20.4, potassium 3.2, albumin 2.4, AST 43, BUN 35.  Last recorded B/P 147/ 98 heart rate 114, respirations 31 temperature 98.8 A Last dose of subcutaneous prophylactic lovenox administered on 8.23.22. NKDA. Patient is on TPN for nutrition.   Risks and benefits of intra abdominal arteriogram with possible intervention  were discussed with the patient  and his wife including, but not limited to bleeding, infection, vascular injury or contrast induced renal failure.  This interventional procedure involves the use of X-rays and because of the nature of the planned procedure, it is possible that we will have prolonged use of X-ray fluoroscopy.  Potential radiation risks to you include (but are not limited to) the following: - A slightly elevated risk for cancer  several years later in life. This risk is typically  less than 0.5% percent. This risk is low in comparison to the normal incidence of human cancer, which is 33% for women and 50% for men according to the [employer]. - Radiation induced injury can include skin redness, resembling a rash, tissue breakdown / ulcers and hair loss (which can be temporary or permanent).   The likelihood of either of these occurring depends on the difficulty of the procedure and whether you are sensitive to  radiation due to previous procedures, disease, or genetic conditions.   IF your procedure requires a prolonged use of radiation, you will be notified and given written instructions for further action.  It is your responsibility to monitor the irradiated area for the 2 weeks following the procedure and to notify your physician if you are concerned that you have suffered a radiation induced injury.    All of the patient's and his wife's questions were answered, patient is agreeable to proceed.  Consent signed and in chart.   Thank you for this interesting consult.  I greatly enjoyed meeting Dhyan Noah and look forward to participating in their care.  A copy of this report was sent to the requesting provider on this date.  Electronically Signed: Alene Mires, NP 12/06/2020, 9:12 AM   I spent a total of 40 Minutes    in face to face in clinical consultation, greater than 50% of which was counseling/coordinating care for intra abdominal arteriogram with possible embolization

## 2020-12-06 NOTE — Progress Notes (Signed)
Video connection link was texted x2 to participant via mobile number provided.  Connection successful. 

## 2020-12-06 NOTE — Progress Notes (Signed)
OT Cancellation Note  Patient Details Name: Ernest Rodgers MRN: [id number] DOB: [birth year]   Cancelled Treatment:    Reason Eval/Treat Not Completed: Medical issues which prohibited therapy  Burnett Corrente Jad Johansson, OT/L   Acute OT Clinical Specialist Acute Rehabilitation Services Pager [phone] Office [phone]  12/06/2020, 9:56 AM

## 2020-12-06 NOTE — Progress Notes (Signed)
Pharmacy Electrolyte Replacement  Recent Labs:  Recent Labs    12/06/20 0730  K 3.2*  MG 2.3  PHOS 3.0  CREATININE 0.87    No critical values noted.   MD Contacted: Dr. Wynona Neat   Plan: Supplement 10 mEq K IV x6 via central line   Jani Gravel, PharmD PGY-1 Acute Care Resident  12/06/2020 9:48 AM

## 2020-12-06 NOTE — Progress Notes (Signed)
eLink Physician-Brief Progress Note Patient Name: Rasheen Bells DOB: [birth year] MRN: [id number]   Date of Service  12/06/2020  HPI/Events of Note  Patient with acute blood loss anemia, hemoglobin 6.6 gm / dl. No clinical evidence of active bleeding.  eICU Interventions  Order entered to transfuse 1 unit PRBC.        Thomasene Lot Miniya Miguez 12/06/2020, 2:18 AM

## 2020-12-06 NOTE — Progress Notes (Addendum)
PHARMACY - TOTAL PARENTERAL NUTRITION CONSULT NOTE   Indication: Prolonged ileus   Patient Measurements: Height: 5\' 10"  (177.8 cm) Weight: 122.6 kg (270 lb 4.5 oz) IBW/kg (Calculated) : 73 TPN AdjBW (KG): 82.8 Body mass index is 38.78 kg/m. Usual Weight: 118kg   Assessment: Patient presented to [facility] ED 8/15 with epigastric pain, found to have acute pancreatitis secondary to alcohol use. On 8/18, he developed altered mental status and hypoxia, for which he was transferred to the ICU. He was started on tube feeds 8/18-8/19. On 8/20 there was concern for ileus vs. SBO due to abdominal distension and general surgery was consulted. CT scan at that time showed distal SBO. CT 8/24 shows contrast through the distal small bowel and colon, more concerning for ileus. CT also shows progression of pancreatitis with increasing inflammatory changes, as well as a new large subscapular hematoma. Notably, patient has also received a Roux-en-Y Gastric Bypass Surgery in 2017, and has multiple nutrient deficiencies, including Vitamin A (11.3), Vitamin C (0.2), Zinc (42). Pharmacy has been consulted for TPN dosing to meet nutritional needs.   Below is the timeline for feeding as denoted by 2018, RD:   8/16 - clear liquid diet initiated at 1529  8/17 - regular diet initiated at 0839 (PO 25-50% x 2 meals)  8/18 - found unresponsive on floor, intubated, trickle TF initiated  8/19 - TF advanced to goal  8/20 - TF d/c for ileus vs SBO, OG tube to suction  8/21 - CT abdomen/pelvis showing distal SBO  8/23 - extubated, OG tube removed  Glucose / Insulin: Current CBGs running 170s-200s, most CBGs <200, with one 217 (received 16 U aspart since the start of TPN)  Electrolytes:  - Mg 2.3 (s/p 2g Iv x1 8/24) - K 3.2- increased rate will provide 72 mEq (s/p 10 mEq x6 + 10 mEq in fluids for a total of 70 mEq, goal >/=4); - Na 151 (s/p D5W with 20 meq Kcl x4h; no Na in TPN)  - Phosphorous 3 (decreased from  3.5) Renal: Scr 0.87 (improved); UOP decreased 0.6 mL/kg/h>0.3 mL/kg/h (patient has had substantial blood loss from subscapular hematoma- going for embolization with IR 8/25)  Hepatic: AST/ALT 43/29, Tbili 1.7, TG 391- drawn near TPN per RN (previously 67 11/26/20). hepatic steatosis noted on CT 8/24.   Intake / Output; MIVF: +12.8L; MIVF D5 w/ 20 mEq/L Kcl at 125 mL/h for 4h    GI Imaging: 8/24 CT shows hepatatic steatosis, oral contrast through small bowel and colon indicative of ileus, and progressive pancreatitis with increasing inflammation.  GI Surgeries / Procedures: Roux-en-Y gastric Bypass 2017. IR embolization for splenic hematoma .8/25  Central access: None  TPN start date: 12/05/20  Nutritional Goals: Goal TPN rate is 105 mL/hr (provides 146 g of protein and ~2,630 kcals per day) TPN rate of 60 mL/h will provide ~83 g protein and 1,504 Kcals/d.   RD Assessment: Estimated Needs Total Energy Estimated Needs: 2500-2700 Total Protein Estimated Needs: 130-150 grams Total Fluid Estimated Needs: >/= 2.0 L  Current Nutrition:  NPO  Plan:  INCREASE TPN to 60 mL/hr at 1800- Notably patient at risk for refeeding syndrome due to alcohol use and malnutrition at baseline.  Electrolytes in TPN: continue Na 0 mEq/L, K 32mEq/L, Ca 68mEq/L, Mg 29mEq/L, and Phos 17 mmol/L (increased from 15 mmol/L). Maximized acetate.  Add standard MVI and trace elements to TPN. Added Zinc, folic acid, thiamine.  Initiate Resistant q4h SSI and adjust as needed ADD 10  U insulin regular. Will receive 500 mL D5W bolus s/p IR embolization 8/25.  Monitor TPN labs on Mon/Thurs, Check CMET, Mg, Phos in AM. Will replete aggressively with signs of refeeding and following surgery recommendations for electrolyte replacement.  Recheck Tg 8/25 since potential for false elevation before adjusting lipids in TPN.  Will add enteral Vitamins A and C when able to use GI tract.   Jani Gravel, PharmD PGY-1 Acute Care Resident   12/06/2020 11:59 AM

## 2020-12-06 NOTE — Progress Notes (Signed)
PT Cancellation Note  Patient Details Name: Ernest Rodgers MRN: [id number] DOB: Jun 09, 1970   Cancelled Treatment:    Reason Eval/Treat Not Completed: Patient not medically ready - pt with continued drop in hgb and splenic hematoma, pending IR consult for possible embolization. PT to check back tomorrow post-procedure.   Marye Round, PT DPT Acute Rehabilitation Services Pager [phone]  Office [phone]    Truddie Coco 12/06/2020, 8:59 AM

## 2020-12-06 NOTE — Procedures (Signed)
Interventional Radiology Procedure:   Indications: Splenic hemorrhage and decreasing hemoglobin.  Procedure: Splenic artery embolization  Findings: No active splenic bleeding.  Coil embolization of main splenic artery.   Near stagnant flow in proximal splenic artery at end of procedure.   Right groin closure with Angioseal.  Complications: No immediate complications noted.     EBL: Minimal  Plan: Bedrest today.  Follow CBC.    Dealie Koelzer R. Lowella Dandy, MD  Pager: [phone]

## 2020-12-06 NOTE — Progress Notes (Signed)
Bolus of D5 water for hypernatremia-500 cc

## 2020-12-07 DIAGNOSIS — R935 Abnormal findings on diagnostic imaging of other abdominal regions, including retroperitoneum: Secondary | ICD-10-CM | POA: Diagnosis not present

## 2020-12-07 DIAGNOSIS — K859 Acute pancreatitis without necrosis or infection, unspecified: Secondary | ICD-10-CM | POA: Diagnosis not present

## 2020-12-07 LAB — CBC
HCT: 24.9 % — ABNORMAL LOW (ref 39.0–52.0)
HCT: 24.2 % — ABNORMAL LOW (ref 39.0–52.0)
Hemoglobin: 8 g/dL — ABNORMAL LOW (ref 13.0–17.0)
Hemoglobin: 7.9 g/dL — ABNORMAL LOW (ref 13.0–17.0)
MCH: 34.2 pg — ABNORMAL HIGH (ref 26.0–34.0)
MCH: 34.5 pg — ABNORMAL HIGH (ref 26.0–34.0)
MCHC: 32.1 g/dL (ref 30.0–36.0)
MCHC: 32.6 g/dL (ref 30.0–36.0)
MCV: 106.4 fL — ABNORMAL HIGH (ref 80.0–100.0)
MCV: 105.7 fL — ABNORMAL HIGH (ref 80.0–100.0)
Platelets: 210 10*3/uL (ref 150–400)
Platelets: 229 10*3/uL (ref 150–400)
RBC: 2.34 MIL/uL — ABNORMAL LOW (ref 4.22–5.81)
RBC: 2.29 MIL/uL — ABNORMAL LOW (ref 4.22–5.81)
RDW: 23.2 % — ABNORMAL HIGH (ref 11.5–15.5)
RDW: 23.4 % — ABNORMAL HIGH (ref 11.5–15.5)
WBC: 11.4 10*3/uL — ABNORMAL HIGH (ref 4.0–10.5)
WBC: 10.7 10*3/uL — ABNORMAL HIGH (ref 4.0–10.5)
nRBC: 0 % (ref 0.0–0.2)
nRBC: 0 % (ref 0.0–0.2)

## 2020-12-07 LAB — BASIC METABOLIC PANEL
Anion gap: 11 (ref 5–15)
BUN: 21 mg/dL — ABNORMAL HIGH (ref 6–20)
CO2: 23 mmol/L (ref 22–32)
Calcium: 8.7 mg/dL — ABNORMAL LOW (ref 8.9–10.3)
Chloride: 115 mmol/L — ABNORMAL HIGH (ref 98–111)
Creatinine, Ser: 0.78 mg/dL (ref 0.61–1.24)
GFR, Estimated: >60 mL/min (ref >60)
Glucose, Bld: 213 mg/dL — ABNORMAL HIGH (ref 70–99)
Potassium: 3.5 mmol/L (ref 3.5–5.1)
Sodium: 149 mmol/L — ABNORMAL HIGH (ref 135–145)

## 2020-12-07 LAB — GLUCOSE, CAPILLARY
Glucose-Capillary: 212 mg/dL — ABNORMAL HIGH (ref 70–99)
Glucose-Capillary: 141 mg/dL — ABNORMAL HIGH (ref 70–99)
Glucose-Capillary: 144 mg/dL — ABNORMAL HIGH (ref 70–99)
Glucose-Capillary: 189 mg/dL — ABNORMAL HIGH (ref 70–99)
Glucose-Capillary: 173 mg/dL — ABNORMAL HIGH (ref 70–99)
Glucose-Capillary: 153 mg/dL — ABNORMAL HIGH (ref 70–99)
Glucose-Capillary: 218 mg/dL — ABNORMAL HIGH (ref 70–99)

## 2020-12-07 LAB — MAGNESIUM: Magnesium: 2.1 mg/dL (ref 1.7–2.4)

## 2020-12-07 LAB — TRIGLYCERIDES: Triglycerides: 102 mg/dL (ref <150)

## 2020-12-07 LAB — PHOSPHORUS: Phosphorus: 2.7 mg/dL (ref 2.5–4.6)

## 2020-12-07 MED ORDER — DEXTROSE 5 % IV SOLN: INTRAVENOUS | Status: AC

## 2020-12-07 MED ORDER — MAGNESIUM SULFATE IN D5W 1-5 GM/100ML-% IV SOLN
1.0000 g | Freq: Once | INTRAVENOUS | Status: AC
  Administered 2020-12-07: 1 g via INTRAVENOUS
  Filled 2020-12-07: qty 100

## 2020-12-07 MED ORDER — WHITE PETROLATUM EX OINT
TOPICAL_OINTMENT | CUTANEOUS | Status: DC | PRN
  Administered 2020-12-07 – 2020-12-13 (×3): 0.2 via TOPICAL
  Filled 2020-12-07 (×3): qty 28.35

## 2020-12-07 MED ORDER — ASCORBIC ACID 500 MG PO TABS
1000.0000 mg | ORAL_TABLET | Freq: Every day | ORAL | Status: DC
  Administered 2020-12-07 – 2020-12-13 (×6): 1000 mg via ORAL
  Filled 2020-12-07 (×8): qty 2

## 2020-12-07 MED ORDER — POTASSIUM CHLORIDE 10 MEQ/50ML IV SOLN
10.0000 meq | INTRAVENOUS | Status: AC
  Administered 2020-12-07 (×4): 10 meq via INTRAVENOUS
  Filled 2020-12-07 (×4): qty 50

## 2020-12-07 MED ORDER — DEXTROSE 5 % IV BOLUS
500.0000 mL | Freq: Once | INTRAVENOUS | Status: AC
  Administered 2020-12-07: 500 mL via INTRAVENOUS

## 2020-12-07 MED ORDER — ZINC CHLORIDE 1 MG/ML IV SOLN
INTRAVENOUS | Status: DC
  Filled 2020-12-07: qty 835.2

## 2020-12-07 MED ORDER — VITAMIN A 3 MG (10000 UNIT) PO CAPS
20000.0000 [IU] | ORAL_CAPSULE | Freq: Every day | ORAL | Status: DC
  Administered 2020-12-07 – 2020-12-13 (×7): 20000 [IU] via ORAL
  Filled 2020-12-07 (×7): qty 2

## 2020-12-07 MED ORDER — POTASSIUM PHOSPHATES 15 MMOLE/5ML IV SOLN
20.0000 mmol | Freq: Once | INTRAVENOUS | Status: AC
  Administered 2020-12-07: 20 mmol via INTRAVENOUS
  Filled 2020-12-07: qty 6.67

## 2020-12-07 MED ORDER — FREE WATER: 200.0000 mL | Freq: Four times a day (QID) | Status: DC

## 2020-12-07 MED ORDER — LABETALOL HCL 5 MG/ML IV SOLN
5.0000 mg | Freq: Four times a day (QID) | INTRAVENOUS | Status: DC | PRN
  Administered 2020-12-07: 5 mg via INTRAVENOUS
  Filled 2020-12-07: qty 4

## 2020-12-07 MED ORDER — BOOST / RESOURCE BREEZE PO LIQD CUSTOM
1.0000 | Freq: Three times a day (TID) | ORAL | Status: DC
  Administered 2020-12-07 – 2020-12-10 (×10): 1 via ORAL

## 2020-12-07 NOTE — Evaluation (Signed)
Physical Therapy Evaluation Patient Details Name: Ernest Rodgers MRN: [id number] DOB: 1971/03/15 Today's Date: 12/07/2020   History of Present Illness  50 yo male presents to [facility] on 8/15 with L rib pain, epigastric pain radiating to back after jump into ball pit. CT chest shows acute pancreatitis with likely reactive changes to duodenum, hepatic steatosis, L 6-8th rib fx. on CIWA. On 8/18, pt with rapid response called due to tachycardia and hypoxia to 60s suspect due to HF vs ARDS, ETT 8/18-8/23 . CT abd shows small bowel obstruction on 8/21, OGT 8/21-8/23. Worsening agitation and hallucinations starting 8/23. s/p successful splenic artery embolization on 8/25. 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  Clinical Impression   Pt presents with impaired cognition with hallucinations during eval, debility, tachypnea/tachycardia with mobility, poor balance, max difficulty to perform mobility tasks, and decreased activity tolerance vs baseline. Pt to benefit from acute PT to address deficits. Pt requiring max 2-3 assist for bed mobility and transfer to/from [facility]. At baseline, pt is independent with mobility and works as a travel Engineer, civil (consulting). PT to progress mobility as tolerated, and will continue to follow acutely.    HR 113-143 bpm, RRmax 47 breaths/min, SpO2 92% and greater on 4LO2 when pleth is accurate    Follow Up Recommendations CIR    Equipment Recommendations  Other (comment) (TBD)    Recommendations for Other Services       Precautions / Restrictions Precautions Precautions: Fall Restrictions Weight Bearing Restrictions: No      Mobility  Bed Mobility Overal bed mobility: Needs Assistance Bed Mobility: Sit to Supine;Supine to Sit;Rolling Rolling: Max assist   Supine to sit: Max assist;+2 for physical assistance Sit to supine: Max assist;+2 for physical assistance   General bed mobility  comments: max +2 for trunk and LE management, boost up in bed with trendelenburg function. Very increased time, step-by-step sequencing cues. max assist to roll bilat for clean up s/p BM in BSC.    Transfers Overall transfer level: Needs assistance Equipment used: 2 person hand held assist Transfers: Sit to/from [employer] Sit to Stand: Max assist;+2 physical assistance Stand pivot transfers: Max assist;+2 physical assistance (+3 for back to bed from [facility])       General transfer comment: max +2 for power up and rise with assist of bed pad, steadying, and pivot to BSC at bedside towards pt's L. Max +3 for back to bed for holding BSC and boost up.  Ambulation/Gait             General Gait Details: nt - pivotal steps only  Information systems manager Rankin (Stroke Patients Only)       Balance Overall balance assessment: Needs assistance Sitting-balance support: No upper extremity supported;Feet supported Sitting balance-Leahy Scale: Fair       Standing balance-Leahy Scale: Zero Standing balance comment: max +2-3 for standing                             Pertinent Vitals/Pain Pain Assessment: Faces Faces Pain Scale: Hurts even more Pain Location: RLE, when moved Pain Descriptors / Indicators: Sore;Grimacing;Guarding Pain Intervention(s): Limited activity within patient's tolerance;Monitored during session;Repositioned    Home Living Family/patient expects to be discharged to:: Private residence Living Arrangements: Spouse/significant other Available Help at Discharge: Family Type  of Home: Mobile home (using RV right now) Home Access: Stairs to enter   Entrance Stairs-Number of Steps: "a few" Home Layout: One level Home Equipment: None      Prior Function Level of Independence: Independent         Comments: pt works as a travel Ambulance person: Right    Extremity/Trunk  Assessment   Upper Extremity Assessment Upper Extremity Assessment: Defer to OT evaluation    Lower Extremity Assessment Lower Extremity Assessment: RLE deficits/detail;LLE deficits/detail RLE Deficits / Details: 2+/5 knee extension bilat; 2/5 hip abd, hip flex, DF/PF (R groin incision from procedure yesterday) RLE: Unable to fully assess due to pain LLE Deficits / Details: 2+/5 knee extension bilat; 2/5 hip abd, hip flex, DF/PF    Cervical / Trunk Assessment Cervical / Trunk Assessment: Normal  Communication   Communication: No difficulties  Cognition Arousal/Alertness: Awake/alert Behavior During Therapy: Restless;Impulsive Overall Cognitive Status: Impaired/Different from baseline Area of Impairment: Orientation;Attention;Following commands;Memory;Safety/judgement;Problem solving                 Orientation Level: Disoriented to;Place;Time;Situation Current Attention Level: Focused Memory: Decreased short-term memory Following Commands: Follows one step commands inconsistently;Follows one step commands with increased time Safety/Judgement: Decreased awareness of safety;Decreased awareness of deficits   Problem Solving: Slow processing;Decreased initiation;Difficulty sequencing;Requires verbal cues;Requires tactile cues General Comments: Pt states he is in [place], mentions something about "being underground", and states he is here because he was coming to work. Pt does not describe anything about accident, pancreatitis, etc. Pt restless and calling out to people who are not present, talking to wall like it is his friend per wife. At times pt talking nonsensically. Pt requires short, max verbal cuing for participation in mobility.      General Comments General comments (skin integrity, edema, etc.): HR 113-143 bpm, RRmax 47 breaths/min, SpO2 92% and greater on 4LO2 when pleth is accurate    Exercises     Assessment/Plan    PT Assessment Patient needs continued PT  services  PT Problem List Decreased strength;Decreased mobility;Decreased safety awareness;Obesity;Decreased coordination;Decreased activity tolerance;Decreased cognition;Cardiopulmonary status limiting activity;Pain;Decreased knowledge of use of DME;Decreased balance       PT Treatment Interventions DME instruction;Therapeutic activities;Gait training;Therapeutic exercise;Patient/family education;Balance training;Stair training;Functional mobility training;Neuromuscular re-education    PT Goals (Current goals can be found in the Care Plan section)  Acute Rehab PT Goals Patient Stated Goal: per wife, get stronger PT Goal Formulation: With family Time For Goal Achievement: 12/21/20 Potential to Achieve Goals: Good    Frequency Min 3X/week   Barriers to discharge        Co-evaluation PT/OT/SLP Co-Evaluation/Treatment: Yes Reason for Co-Treatment: For patient/therapist safety;To address functional/ADL transfers;Necessary to address cognition/behavior during functional activity PT goals addressed during session: Mobility/safety with mobility;Balance;Strengthening/ROM         AM-PAC PT "6 Clicks" Mobility  Outcome Measure Help needed turning from your back to your side while in a flat bed without using bedrails?: A Lot Help needed moving from lying on your back to sitting on the side of a flat bed without using bedrails?: Total Help needed moving to and from a bed to a chair (including a wheelchair)?: Total Help needed standing up from a chair using your arms (e.g., wheelchair or bedside chair)?: Total Help needed to walk in hospital room?: Total Help needed climbing 3-5 steps with a railing? : Total 6 Click Score: 7    End of Session Equipment  Utilized During Treatment: Gait belt;Oxygen Activity Tolerance: Patient limited by pain;Patient limited by fatigue Patient left: in bed;with call bell/phone within reach;with bed alarm set;with family/visitor present;Other (comment) (in  semi-chair position in bed) Nurse Communication: Mobility status PT Visit Diagnosis: Other abnormalities of gait and mobility (R26.89);Difficulty in walking, not elsewhere classified (R26.2)    Time: 9678-9381 PT Time Calculation (min) (ACUTE ONLY): 50 min   Charges:   PT Evaluation $PT Eval Moderate Complexity: 1 Mod PT Treatments $Therapeutic Activity: 8-22 mins        Marye Round, PT DPT Acute Rehabilitation Services Pager [phone]  Office [phone]   Caci Orren E Christain [place] 12/07/2020, 1:30 PM

## 2020-12-07 NOTE — Progress Notes (Signed)
Subjective: CC: Patient more awake and alert. Sitting up in bed. A&O x 3 (self, time, situation but not place). He denies abdominal pain or emesis. Occassional nausea but none currently. Passed flatus and had a liquid bm this am. S/p IR embo of spleen 8/25. Wife at bedside and updated.   Objective: Vital signs in last 24 hours: Temp:  [98 F (36.7 C)-101.5 F (38.6 C)] 100.4 F (38 C) (08/26 0241) Pulse Rate:  [92-118] 92 (08/26 0600) Resp:  [15-37] 27 (08/26 0600) BP: (98-177)/(54-103) 171/103 (08/26 0600) SpO2:  [92 %-100 %] 98 % (08/26 0600) Last BM Date: 12/04/20  Intake/Output from previous day: 08/25 0701 - 08/26 0700 In: 1626.4 [I.V.:399.8; Blood:222; IV Piggyback:1004.6] Out: 900 [Urine:900] Intake/Output this shift: No intake/output data recorded.  PE: Gen:  Alert, NAD, pleasant Card:  Reg rate with regular rhythm Pulm:  Distant breath sounds at the bases. Clear to upper lung fields b/l w/o rales, rhonchi or wheezing. Normal rate and effort. On o2 Abd: Soft, mild distension of the upper abdomen, NT, no peritonitis, +BS Psych: A&Ox3  Lab Results:  Recent Labs    12/06/20 0408 12/06/20 0958 12/06/20 2251 12/07/20 0414  WBC 12.5*  --   --  11.4*  HGB 6.8*   < > 6.5* 8.0*  HCT 20.4*   < > 19.5* 24.9*  PLT 163  --   --  210   < > = values in this interval not displayed.   BMET Recent Labs    12/06/20 0730 12/07/20 0414  NA 151* 149*  K 3.2* 3.5  CL 119* 115*  CO2 23 23  GLUCOSE 181* 213*  BUN 35* 21*  CREATININE 0.87 0.78  CALCIUM 8.1* 8.7*   PT/INR Recent Labs    12/05/20 1219  LABPROT 17.7*  INR 1.5*   CMP     Component Value Date/Time   NA 149 (H) 12/07/2020 0414   K 3.5 12/07/2020 0414   CL 115 (H) 12/07/2020 0414   CO2 23 12/07/2020 0414   GLUCOSE 213 (H) 12/07/2020 0414   BUN 21 (H) 12/07/2020 0414   CREATININE 0.78 12/07/2020 0414   CALCIUM 8.7 (L) 12/07/2020 0414   PROT 5.1 (L) 12/06/2020 0730   ALBUMIN 2.4 (L)  12/06/2020 0730   AST 43 (H) 12/06/2020 0730   ALT 29 12/06/2020 0730   ALKPHOS 59 12/06/2020 0730   BILITOT 1.7 (H) 12/06/2020 0730   GFRNONAA >60 12/07/2020 0414   Lipase     Component Value Date/Time   LIPASE 507 (H) 11/27/2020 0208    Studies/Results: CT Angio Chest Pulmonary Embolism (PE) W or WO Contrast  Result Date: 12/05/2020 CLINICAL DATA:  50 year old male with history of tachycardia and tachypnea. Pancreatitis. Abdominal distension. Fevers. EXAM: CT ANGIOGRAPHY CHEST CT ABDOMEN AND PELVIS WITH CONTRAST TECHNIQUE: Multidetector CT imaging of the chest was performed using the standard protocol during bolus administration of intravenous contrast. Multiplanar CT image reconstructions and MIPs were obtained to evaluate the vascular anatomy. Multidetector CT imaging of the abdomen and pelvis was performed using the standard protocol during bolus administration of intravenous contrast. CONTRAST:  OMNIPAQUE IOHEXOL 350 MG/ML SOLN COMPARISON:  CT of the abdomen and pelvis 12/02/2020. Chest CT 11/26/2020. FINDINGS: CTA CHEST FINDINGS Cardiovascular: There are no filling defects within the pulmonary arterial tree to suggest pulmonary embolism. Heart size is normal. Trace amount of pericardial fluid and/or thickening, unlikely to be of hemodynamic significance at this time. No associated pericardial  calcification. There is aortic atherosclerosis, as well as atherosclerosis of the great vessels of the mediastinum and the coronary arteries, including calcified atherosclerotic plaque in the left main, left anterior descending, left circumflex and right coronary arteries. Mediastinum/Nodes: No pathologically enlarged mediastinal or hilar lymph nodes. Esophagus is unremarkable in appearance. No axillary lymphadenopathy. Lungs/Pleura: Resolving areas of airspace consolidation are noted throughout the lungs bilaterally, most evident in the lower lobes of the lungs bilaterally where there continues to  be extensive subsegmental atelectasis and some residual airspace disease. Trace bilateral pleural effusions lying dependently. Musculoskeletal: There are no aggressive appearing lytic or blastic lesions noted in the visualized portions of the skeleton. Review of the MIP images confirms the above findings. CT ABDOMEN and PELVIS FINDINGS Hepatobiliary: Diffuse low attenuation throughout the hepatic parenchyma, indicative of hepatic steatosis. No suspicious cystic or solid hepatic lesions. No intra or extrahepatic biliary ductal dilatation. Status post cholecystectomy. Pancreas: No discrete pancreatic mass confidently identified. There continues to be a small amount of peripancreatic fluid and extensive peripancreatic inflammatory changes indicative of acute pancreatitis. Portions of the pancreatic parenchyma do not enhance well, particularly in the body and tail of the pancreas, concerning for areas of potential pancreatic necrosis (although assessment is limited by suboptimal contrast bolus). Some of the peripancreatic fluid appears to be organizing into small pseudo cysts, largest of which is superior to the proximal pancreatic body (axial image 27 of series 6) measuring 3.8 x 2.0 cm. Spleen: Large subcapsular hematoma in the spleen common new compared to the prior study. Adrenals/Urinary Tract: Bilateral kidneys and adrenal glands are normal in appearance. No hydroureteronephrosis. Urinary bladder is nearly completely decompressed around an indwelling Foley balloon catheter. 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 Several dilated loops of small bowel are noted in the central abdomen measuring up to 4.8 cm in diameter, improved compared to the prior study. Oral contrast material on today's examination is noted throughout the distal small bowel and colon, indicating that small-bowel findings are more likely related to ileus than partial obstruction. Rectal tube noted.  Vascular/Lymphatic: Aortic atherosclerosis, without evidence of aneurysm or dissection in the abdominal or pelvic vasculature. No lymphadenopathy noted in the abdomen or pelvis. Reproductive: Status post hysterectomy. Ovaries are not confidently identified may be surgically absent or atrophic. Other: Tracking inferiorly from the spleen along the left pericolic gutter there is a moderate amount of high attenuation fluid, indicative of hemoperitoneum. Small to moderate volume of ascites elsewhere in the abdomen ranging from low to intermediate attenuation, presumably reflective of hemorrhagic contents. No pneumoperitoneum. Musculoskeletal: There are no aggressive appearing lytic or blastic lesions noted in the visualized portions of the skeleton. Review of the MIP images confirms the above findings. IMPRESSION: 1. New large subcapsular hematoma associated with the spleen. Adjacent to this there is hemorrhagic fluid in the peritoneal cavity (hemoperitoneum) tracking down the left pericolic gutter extending into the pelvis. Although active extravasation is not confidently identified on today's examination, this hematoma likely rapidly expanded, and the possibility of active bleeding should be considered. 2. Progression of pancreatitis with increasing inflammatory changes surrounding the pancreas and possible areas of hypoenhancement in the body and tail of the pancreas which could reflect developing pancreatic necrosis. Small peripancreatic pseudocysts are also noted. 3. No evidence of pulmonary embolism. 4. Resolving bilateral lower lobe pneumonia with residual areas of subsegmental atelectasis and a small amount of residual airspace consolidation. Trace bilateral parapneumonic pleural effusions. 5. Hepatic steatosis. 6. Aortic atherosclerosis, in addition to  left main and 3 vessel coronary artery disease. Please note that although the presence of coronary artery calcium documents the presence of coronary artery  disease, the severity of this disease and any potential stenosis cannot be assessed on this non-gated CT examination. Assessment for potential risk factor modification, dietary therapy or pharmacologic therapy may be warranted, if clinically indicated. 7. Additional incidental findings, as above. Critical Value/emergent results were called by telephone at the time of interpretation on 12/05/2020 at 11:31 am to provider Virl Diamond, who verbally acknowledged these results. Electronically Signed   By: Trudie Reed M.D.   On: 12/05/2020 11:34   CT ABDOMEN PELVIS W CONTRAST  Result Date: 12/05/2020 CLINICAL DATA:  50 year old male with history of tachycardia and tachypnea. Pancreatitis. Abdominal distension. Fevers. EXAM: CT ANGIOGRAPHY CHEST CT ABDOMEN AND PELVIS WITH CONTRAST TECHNIQUE: Multidetector CT imaging of the chest was performed using the standard protocol during bolus administration of intravenous contrast. Multiplanar CT image reconstructions and MIPs were obtained to evaluate the vascular anatomy. Multidetector CT imaging of the abdomen and pelvis was performed using the standard protocol during bolus administration of intravenous contrast. CONTRAST:  OMNIPAQUE IOHEXOL 350 MG/ML SOLN COMPARISON:  CT of the abdomen and pelvis 12/02/2020. Chest CT 11/26/2020. FINDINGS: CTA CHEST FINDINGS Cardiovascular: There are no filling defects within the pulmonary arterial tree to suggest pulmonary embolism. Heart size is normal. Trace amount of pericardial fluid and/or thickening, unlikely to be of hemodynamic significance at this time. No associated pericardial calcification. There is aortic atherosclerosis, as well as atherosclerosis of the great vessels of the mediastinum and the coronary arteries, including calcified atherosclerotic plaque in the left main, left anterior descending, left circumflex and right coronary arteries. Mediastinum/Nodes: No pathologically enlarged mediastinal or hilar lymph  nodes. Esophagus is unremarkable in appearance. No axillary lymphadenopathy. Lungs/Pleura: Resolving areas of airspace consolidation are noted throughout the lungs bilaterally, most evident in the lower lobes of the lungs bilaterally where there continues to be extensive subsegmental atelectasis and some residual airspace disease. Trace bilateral pleural effusions lying dependently. Musculoskeletal: There are no aggressive appearing lytic or blastic lesions noted in the visualized portions of the skeleton. Review of the MIP images confirms the above findings. CT ABDOMEN and PELVIS FINDINGS Hepatobiliary: Diffuse low attenuation throughout the hepatic parenchyma, indicative of hepatic steatosis. No suspicious cystic or solid hepatic lesions. No intra or extrahepatic biliary ductal dilatation. Status post cholecystectomy. Pancreas: No discrete pancreatic mass confidently identified. There continues to be a small amount of peripancreatic fluid and extensive peripancreatic inflammatory changes indicative of acute pancreatitis. Portions of the pancreatic parenchyma do not enhance well, particularly in the body and tail of the pancreas, concerning for areas of potential pancreatic necrosis (although assessment is limited by suboptimal contrast bolus). Some of the peripancreatic fluid appears to be organizing into small pseudo cysts, largest of which is superior to the proximal pancreatic body (axial image 27 of series 6) measuring 3.8 x 2.0 cm. Spleen: Large subcapsular hematoma in the spleen common new compared to the prior study. Adrenals/Urinary Tract: Bilateral kidneys and adrenal glands are normal in appearance. No hydroureteronephrosis. Urinary bladder is nearly completely decompressed around an indwelling Foley balloon catheter. 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 Several dilated loops of small bowel are noted in the central abdomen measuring up to 4.8 cm in  diameter, improved compared to the prior study. Oral contrast material on today's examination is noted throughout the distal small bowel and colon, indicating that small-bowel  findings are more likely related to ileus than partial obstruction. Rectal tube noted. Vascular/Lymphatic: Aortic atherosclerosis, without evidence of aneurysm or dissection in the abdominal or pelvic vasculature. No lymphadenopathy noted in the abdomen or pelvis. Reproductive: Status post hysterectomy. Ovaries are not confidently identified may be surgically absent or atrophic. Other: Tracking inferiorly from the spleen along the left pericolic gutter there is a moderate amount of high attenuation fluid, indicative of hemoperitoneum. Small to moderate volume of ascites elsewhere in the abdomen ranging from low to intermediate attenuation, presumably reflective of hemorrhagic contents. No pneumoperitoneum. Musculoskeletal: There are no aggressive appearing lytic or blastic lesions noted in the visualized portions of the skeleton. Review of the MIP images confirms the above findings. IMPRESSION: 1. New large subcapsular hematoma associated with the spleen. Adjacent to this there is hemorrhagic fluid in the peritoneal cavity (hemoperitoneum) tracking down the left pericolic gutter extending into the pelvis. Although active extravasation is not confidently identified on today's examination, this hematoma likely rapidly expanded, and the possibility of active bleeding should be considered. 2. Progression of pancreatitis with increasing inflammatory changes surrounding the pancreas and possible areas of hypoenhancement in the body and tail of the pancreas which could reflect developing pancreatic necrosis. Small peripancreatic pseudocysts are also noted. 3. No evidence of pulmonary embolism. 4. Resolving bilateral lower lobe pneumonia with residual areas of subsegmental atelectasis and a small amount of residual airspace consolidation. Trace  bilateral parapneumonic pleural effusions. 5. Hepatic steatosis. 6. Aortic atherosclerosis, in addition to left main and 3 vessel coronary artery disease. Please note that although the presence of coronary artery calcium documents the presence of coronary artery disease, the severity of this disease and any potential stenosis cannot be assessed on this non-gated CT examination. Assessment for potential risk factor modification, dietary therapy or pharmacologic therapy may be warranted, if clinically indicated. 7. Additional incidental findings, as above. Critical Value/emergent results were called by telephone at the time of interpretation on 12/05/2020 at 11:31 am to provider Virl Diamond, who verbally acknowledged these results. Electronically Signed   By: Trudie Reed M.D.   On: 12/05/2020 11:34   IR Angiogram Visceral Selective  Result Date: 12/06/2020 INDICATION: 50 year old with a large subcapsular splenic hematoma and decreasing hemoglobin. Plan for arteriography and splenic artery embolization. EXAM: 1. Visceral angiography with selective injection of the celiac artery trunk and splenic artery 2. Coil embolization of the splenic artery 3. Ultrasound guidance for vascular access MEDICATIONS: Moderate sedation ANESTHESIA/SEDATION: Moderate (conscious) sedation was employed during this procedure. A total of Versed 6.0 mg and Fentanyl 200 mcg was administered intravenously. Moderate Sedation Time: 96 minutes. The patient's level of consciousness and vital signs were monitored continuously by radiology nursing throughout the procedure under my direct supervision. CONTRAST:  60 mL Omnipaque 350 FLUOROSCOPY TIME:  Fluoroscopy Time: 22 minutes, 24 seconds, 1,604 mGy COMPLICATIONS: None immediate. PROCEDURE: Informed consent was obtained from the patient following explanation of the procedure, risks, benefits and alternatives. The patient understands, agrees and consents for the procedure. All questions  were addressed. A time out was performed prior to the initiation of the procedure. Patient was placed supine. Ultrasound confirmed a patent right common femoral artery. Ultrasound image was saved for documentation. Right groin was prepped and draped in sterile fashion. Maximal barrier sterile technique was utilized including caps, mask, sterile gowns, sterile gloves, sterile drape, hand hygiene and skin antiseptic. Right groin was anesthetized with 1% lidocaine. Small incision was made. Using ultrasound guidance, 21 gauge needle was directed into the  right common femoral artery and micropuncture dilator set was placed. Five French vascular sheath was placed. Bentson wire was advanced into the abdominal aorta. C2 catheter was used to cannulate the celiac trunk. Celiac artery arteriography was performed. A Lantern microcatheter was advanced into the splenic artery and additional angiography was performed. Catheter was positioned in the midportion of the main splenic artery. Multiple detachable coils were placed within the splenic artery under fluoroscopic guidance. Combination of Ruby, Interlock andIDC coils were used and the coil sizes included 8 mm, 6 mm and 5 mm. Follow-up angiography was performed following placement of the coils. The microcatheter was removed and the 5 French catheter was removed. Arteriogram was performed through the right groin sheath. Right groin sheath was removed using an Angio-Seal closure device. Right groin hemostasis at the end of the procedure. FINDINGS: Celiac trunk is patent. Common hepatic artery and splenic artery are widely patent. The splenic artery is very tortuous. Microcatheter was advanced into the midportion of the main splenic artery. Multiple coils are packed within the midportion of the splenic artery. At the end of the procedure, there was near stagnant flow within the proximal splenic artery. There was some residual filling of a branch supplying the superior aspect of  the spleen. IMPRESSION: Successful coil embolization of the main splenic artery. Electronically Signed   By: Richarda Overlie M.D.   On: 12/06/2020 17:20   IR US Guide Vasc Access Right  Result Date: 12/06/2020 INDICATION: 50 year old with a large subcapsular splenic hematoma and decreasing hemoglobin. Plan for arteriography and splenic artery embolization. EXAM: 1. Visceral angiography with selective injection of the celiac artery trunk and splenic artery 2. Coil embolization of the splenic artery 3. Ultrasound guidance for vascular access MEDICATIONS: Moderate sedation ANESTHESIA/SEDATION: Moderate (conscious) sedation was employed during this procedure. A total of Versed 6.0 mg and Fentanyl 200 mcg was administered intravenously. Moderate Sedation Time: 96 minutes. The patient's level of consciousness and vital signs were monitored continuously by radiology nursing throughout the procedure under my direct supervision. CONTRAST:  60 mL Omnipaque 350 FLUOROSCOPY TIME:  Fluoroscopy Time: 22 minutes, 24 seconds, 1,604 mGy COMPLICATIONS: None immediate. PROCEDURE: Informed consent was obtained from the patient following explanation of the procedure, risks, benefits and alternatives. The patient understands, agrees and consents for the procedure. All questions were addressed. A time out was performed prior to the initiation of the procedure. Patient was placed supine. Ultrasound confirmed a patent right common femoral artery. Ultrasound image was saved for documentation. Right groin was prepped and draped in sterile fashion. Maximal barrier sterile technique was utilized including caps, mask, sterile gowns, sterile gloves, sterile drape, hand hygiene and skin antiseptic. Right groin was anesthetized with 1% lidocaine. Small incision was made. Using ultrasound guidance, 21 gauge needle was directed into the right common femoral artery and micropuncture dilator set was placed. Five French vascular sheath was placed.  Bentson wire was advanced into the abdominal aorta. C2 catheter was used to cannulate the celiac trunk. Celiac artery arteriography was performed. A Lantern microcatheter was advanced into the splenic artery and additional angiography was performed. Catheter was positioned in the midportion of the main splenic artery. Multiple detachable coils were placed within the splenic artery under fluoroscopic guidance. Combination of Ruby, Interlock andIDC coils were used and the coil sizes included 8 mm, 6 mm and 5 mm. Follow-up angiography was performed following placement of the coils. The microcatheter was removed and the 5 French catheter was removed. Arteriogram was performed through the right  groin sheath. Right groin sheath was removed using an Angio-Seal closure device. Right groin hemostasis at the end of the procedure. FINDINGS: Celiac trunk is patent. Common hepatic artery and splenic artery are widely patent. The splenic artery is very tortuous. Microcatheter was advanced into the midportion of the main splenic artery. Multiple coils are packed within the midportion of the splenic artery. At the end of the procedure, there was near stagnant flow within the proximal splenic artery. There was some residual filling of a branch supplying the superior aspect of the spleen. IMPRESSION: Successful coil embolization of the main splenic artery. Electronically Signed   By: Richarda Overlie M.D.   On: 12/06/2020 17:20   DG Abd Portable 1V  Result Date: 12/05/2020 CLINICAL DATA:  Shortness of breath.  Abdominal distention. EXAM: PORTABLE ABDOMEN - 1 VIEW COMPARISON:  Earlier today FINDINGS: There is diffuse distention of the small and large bowel loops. The cecum measures approximately 12.8 cm. This is similar to previous exam. Small bowel loops measure up to 5.8 cm. Enteric contrast material identified within the distal descending colon and rectum. IMPRESSION: Persistent gaseous distention of the small and large bowel loops  compatible with ileus. Enteric contrast material is again noted within the distal colon. Electronically Signed   By: Signa Kell M.D.   On: 12/05/2020 14:32   IR EMBO ART  VEN HEMORR LYMPH EXTRAV  INC GUIDE ROADMAPPING  Result Date: 12/06/2020 INDICATION: 50 year old with a large subcapsular splenic hematoma and decreasing hemoglobin. Plan for arteriography and splenic artery embolization. EXAM: 1. Visceral angiography with selective injection of the celiac artery trunk and splenic artery 2. Coil embolization of the splenic artery 3. Ultrasound guidance for vascular access MEDICATIONS: Moderate sedation ANESTHESIA/SEDATION: Moderate (conscious) sedation was employed during this procedure. A total of Versed 6.0 mg and Fentanyl 200 mcg was administered intravenously. Moderate Sedation Time: 96 minutes. The patient's level of consciousness and vital signs were monitored continuously by radiology nursing throughout the procedure under my direct supervision. CONTRAST:  60 mL Omnipaque 350 FLUOROSCOPY TIME:  Fluoroscopy Time: 22 minutes, 24 seconds, 1,604 mGy COMPLICATIONS: None immediate. PROCEDURE: Informed consent was obtained from the patient following explanation of the procedure, risks, benefits and alternatives. The patient understands, agrees and consents for the procedure. All questions were addressed. A time out was performed prior to the initiation of the procedure. Patient was placed supine. Ultrasound confirmed a patent right common femoral artery. Ultrasound image was saved for documentation. Right groin was prepped and draped in sterile fashion. Maximal barrier sterile technique was utilized including caps, mask, sterile gowns, sterile gloves, sterile drape, hand hygiene and skin antiseptic. Right groin was anesthetized with 1% lidocaine. Small incision was made. Using ultrasound guidance, 21 gauge needle was directed into the right common femoral artery and micropuncture dilator set was placed. Five  French vascular sheath was placed. Bentson wire was advanced into the abdominal aorta. C2 catheter was used to cannulate the celiac trunk. Celiac artery arteriography was performed. A Lantern microcatheter was advanced into the splenic artery and additional angiography was performed. Catheter was positioned in the midportion of the main splenic artery. Multiple detachable coils were placed within the splenic artery under fluoroscopic guidance. Combination of Ruby, Interlock andIDC coils were used and the coil sizes included 8 mm, 6 mm and 5 mm. Follow-up angiography was performed following placement of the coils. The microcatheter was removed and the 5 French catheter was removed. Arteriogram was performed through the right groin sheath. Right groin sheath  was removed using an Angio-Seal closure device. Right groin hemostasis at the end of the procedure. FINDINGS: Celiac trunk is patent. Common hepatic artery and splenic artery are widely patent. The splenic artery is very tortuous. Microcatheter was advanced into the midportion of the main splenic artery. Multiple coils are packed within the midportion of the splenic artery. At the end of the procedure, there was near stagnant flow within the proximal splenic artery. There was some residual filling of a branch supplying the superior aspect of the spleen. IMPRESSION: Successful coil embolization of the main splenic artery. Electronically Signed   By: Richarda OverlieAdam  Henn M.D.   On: 12/06/2020 17:20   US EKG SITE RITE  Result Date: 12/05/2020 If Site Rite image not attached, placement could not be confirmed due to current cardiac rhythm.   Anti-infectives: Anti-infectives (From admission, onward)    Start     Dose/Rate Route Frequency Ordered Stop   12/03/20 1115  cefTRIAXone (ROCEPHIN) 2 g in sodium chloride 0.9 % 100 mL IVPB        2 g 200 mL/hr over 30 Minutes Intravenous Every 24 hours 12/03/20 1016 12/09/20 2359   12/02/20 1500  linezolid (ZYVOX) IVPB 600 mg         600 mg 300 mL/hr over 60 Minutes Intravenous Every 12 hours 12/02/20 1407 12/07/20 2359   12/02/20 1028  vancomycin variable dose per unstable renal function (pharmacist dosing)  Status:  Discontinued         Does not apply See admin instructions 12/02/20 1028 12/02/20 1407   12/02/20 0000  vancomycin (VANCOREADY) IVPB 1500 mg/300 mL  Status:  Discontinued        1,500 mg 150 mL/hr over 120 Minutes Intravenous Every 12 hours 12/01/20 1126 12/02/20 1028   12/01/20 1215  vancomycin (VANCOREADY) IVPB 2000 mg/400 mL        2,000 mg 200 mL/hr over 120 Minutes Intravenous  Once 12/01/20 1123 12/01/20 1433   11/30/20 1200  Ampicillin-Sulbactam (UNASYN) 3 g in sodium chloride 0.9 % 100 mL IVPB  Status:  Discontinued        3 g 200 mL/hr over 30 Minutes Intravenous Every 6 hours 11/30/20 1055 12/02/20 1522        Assessment/Plan 50 year old male with alcoholic pancreatitis SBO vs Ileus from above - 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 CT 8/24 w/ more of an ileus picture - oral contrast has passed into the distal small bowel and colon. Now having bowel function. Okay with cld if passes swallow - Keep K > 4 and Mg > 2 for bowel function - Mobilize as able for bowel function - We will follow with you. Appreciate CCM's assistance.    Subcapsular hematoma of the spleen ABL anemia  - s/p IR splenic artery embolization 8/25 by IR - s/p 3U total on 8/25. Last hgb responded appropriately to 1U at 2344 from 6.5 > 8.0. Recheck CBC this PM - Will need splenic vaccines prior to discharge    FEN - On TPN. Okay for clears from our standpoint. RN going to do swallow screen. IVF per CCM VTE - SCDs, Lovenox currently on hold 2/2 abl anemia  ID - On Linezolid/Rocephin per CCM  Foley - in place   Alcohol Pancreatitis - now with possible developing  pancreatic necrosis and small peripancreatic  pseudocysts on CT 8/24. Recommended GI consult and discussed with CCM 8/25 who is planning to touch base with GI Elevated LFT's -  Needs close monitoring, especially now that TNA has been initiated Aspiration pneumonitis with Staph aureus in sputum - on abx  Alcohol withdrawal - on thiamine and folic acid. CIWA.  Acute metabolic encephalopathy HTN CHF L rib fx's     LOS: 11 days    Jacinto Halim , [facility] Surgery 12/07/2020, 8:27 AM Please see Amion for pager number during day hours 7:00am-4:30pm

## 2020-12-07 NOTE — Evaluation (Signed)
Occupational Therapy Evaluation Patient Details Name: Ernest Rodgers MRN: [id number] DOB: 1970/11/10 Today's Date: 12/07/2020    History of Present Illness 50 yo male presents to [facility] on 8/15 with L rib pain, epigastric pain radiating to back after jump into ball pit. CT chest shows acute pancreatitis with likely reactive changes to duodenum, hepatic steatosis, L 6-8th rib fx. on CIWA. On 8/18, pt with rapid response called due to tachycardia and hypoxia to 60s suspect due to HF vs ARDS, ETT 8/18-8/23 . CT abd shows small bowel obstruction on 8/21, OGT 8/21-8/23. Worsening agitation and hallucinations starting 8/23. s/p successful splenic artery embolization on 8/25. 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   Clinical Impression   PTA patient was living with his spouse in an RV and was grossly i with ADLs/IADLs without AD. 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 Patient currently functioning below baseline demonstrating observed ADLs including toileting/hygiene/clothing management with Max A +2-3. Patient also limited by deficits listed below including decreased cognition, generalized weakness, decreased activity tolerance and decreased sitting/standing balance and would benefit from continued acute OT services in prep for safe d/c to next level of care. Patient is very motivated and would be a good candidate for CIR. OT will continue to follow acutely.      Follow Up Recommendations  CIR    Equipment Recommendations  Other (comment) (Defer to next level of care)    Recommendations for Other Services Rehab consult     Precautions / Restrictions Precautions Precautions: Fall Precaution Comments: Monitor HR Restrictions Weight Bearing Restrictions: No      Mobility Bed Mobility Overal bed mobility: Needs Assistance Bed Mobility: Sit to Supine;Supine to Sit;Rolling Rolling: Max assist   Supine to sit: Max  assist;+2 for physical assistance Sit to supine: Max assist;+2 for physical assistance   General bed mobility comments: max +2 for trunk and LE management, boost up in bed with trendelenburg function. Very increased time, step-by-step sequencing cues. max assist to roll bilat for clean up s/p BM in BSC.    Transfers Overall transfer level: Needs assistance Equipment used: 2 person hand held assist Transfers: Sit to/from [employer] Sit to Stand: Max assist;+2 physical assistance Stand pivot transfers: Max assist;+2 physical assistance (+3 for back to bed from [facility])       General transfer comment: max +2 for power up and rise with assist of bed pad, steadying, and pivot to BSC at bedside towards pt's L. Max +3 for back to bed for holding BSC and boost up.    Balance Overall balance assessment: Needs assistance Sitting-balance support: No upper extremity supported;Feet supported Sitting balance-Leahy Scale: Fair       Standing balance-Leahy Scale: Zero Standing balance comment: max +2-3 for standing                           ADL either performed or assessed with clinical judgement   ADL Overall ADL's : Needs assistance/impaired     Grooming: Moderate assistance;Sitting Grooming Details (indicate cue type and reason): Mod A and cues for initiation/continuation to wash face seated on BSC.             Lower Body Dressing: Maximal assistance;+2 for physical assistance;+2 for safety/equipment Lower Body Dressing Details (indicate cue type and reason): Max A to don footwear seated EOB. Toilet Transfer: Maximal assistance;+2 for physical assistance;+2 for  safety/equipment Toilet Transfer Details (indicate cue type and reason): Max A +2 for stand-pivot to [facility]; +1 additionally to guide hips. Toileting- Clothing Manipulation and Hygiene: Maximal assistance;+2 for physical assistance;+2 for safety/equipment;Bed level Toileting - Clothing Manipulation Details  (indicate cue type and reason): Returned to bed surface for hygiene/clothing management requiring +2 assist.             Vision   Vision Assessment?: No apparent visual deficits     Perception     Praxis      Pertinent Vitals/Pain Pain Assessment: Faces Faces Pain Scale: Hurts even more Pain Location: RLE, when moved Pain Descriptors / Indicators: Sore;Grimacing;Guarding Pain Intervention(s): Limited activity within patient's tolerance;Monitored during session;Repositioned     Hand Dominance Right   Extremity/Trunk Assessment Upper Extremity Assessment Upper Extremity Assessment: RUE deficits/detail;LUE deficits/detail RUE Deficits / Details: Unable to maintain position of UE in space against gravity, AROM WFL at elbow, wrist and digits LUE Deficits / Details: Unable to maintain position of UE in space against gravity, AROM WFL at elbow, wrist and digits   Lower Extremity Assessment Lower Extremity Assessment: Defer to PT evaluation RLE Deficits / Details: 2+/5 knee extension bilat; 2/5 hip abd, hip flex, DF/PF (R groin incision from procedure yesterday) RLE: Unable to fully assess due to pain LLE Deficits / Details: 2+/5 knee extension bilat; 2/5 hip abd, hip flex, DF/PF   Cervical / Trunk Assessment Cervical / Trunk Assessment: Normal   Communication Communication Communication: No difficulties   Cognition Arousal/Alertness: Awake/alert Behavior During Therapy: Restless;Impulsive Overall Cognitive Status: Impaired/Different from baseline Area of Impairment: Orientation;Attention;Following commands;Memory;Safety/judgement;Problem solving                 Orientation Level: Disoriented to;Place;Time;Situation Current Attention Level: Focused Memory: Decreased short-term memory Following Commands: Follows one step commands inconsistently;Follows one step commands with increased time Safety/Judgement: Decreased awareness of safety;Decreased awareness of  deficits   Problem Solving: Slow processing;Decreased initiation;Difficulty sequencing;Requires verbal cues;Requires tactile cues General Comments: Pleasantly confused; follows 1-step verbal commands with multimodal cues; requires repeat cueing; wife present at bedside reports episodes of hallucinations over last several days.   General Comments  HR 113 at rest. Max HR 143 bpm, RRmax 47 breaths/min, SpO2 92% and greater on 4LO2 with accurate pleth.    Exercises     Shoulder Instructions      Home Living Family/patient expects to be discharged to:: Private residence Living Arrangements: Spouse/significant other Available Help at Discharge: Family Type of Home:  (RV) Home Access: Stairs to enter Entrance Stairs-Number of Steps: "a few"   Home Layout: One level     Bathroom Shower/Tub: Walk-in shower (lip to step over)   Firefighter: Standard     Home Equipment: None          Prior Functioning/Environment Level of Independence: Independent        Comments: pt works as a travel Charity fundraiser at [employer]; wife works on [employer]        OT Problem List: Decreased strength;Decreased activity tolerance;Impaired balance (sitting and/or standing);Decreased knowledge of use of DME or AE;Cardiopulmonary status limiting activity;Obesity;Impaired UE functional use;Pain      OT Treatment/Interventions: Self-care/ADL training;Therapeutic exercise;Energy conservation;DME and/or AE instruction;Therapeutic activities;Cognitive remediation/compensation;Patient/family education;Balance training    OT Goals(Current goals can be found in the care plan section) Acute Rehab OT Goals Patient Stated Goal: per wife, get stronger OT Goal Formulation: With patient/family Time For Goal Achievement: 12/21/20 Potential to Achieve Goals: Good ADL Goals Pt Will Perform Grooming: with  supervision;standing Pt Will Perform Upper Body Dressing: with set-up;sitting Pt Will Perform Lower Body Dressing: with  supervision;sit to/from stand;with adaptive equipment Pt Will Transfer to Toilet: with supervision;ambulating Pt Will Perform Toileting - Clothing Manipulation and hygiene: with supervision;sit to/from stand Pt Will Perform Tub/Shower Transfer: with supervision;ambulating;shower seat Additional ADL Goal #1: Patient will follow 1-step verbal commands with 95% accuracy in prep for ADLs.  OT Frequency: Min 2X/week   Barriers to D/C: Inaccessible home environment  Lives in RV       Co-evaluation PT/OT/SLP Co-Evaluation/Treatment: Yes Reason for Co-Treatment: Complexity of the patient's impairments (multi-system involvement) PT goals addressed during session: Mobility/safety with mobility;Balance;Strengthening/ROM OT goals addressed during session: ADL's and self-care      AM-PAC OT "6 Clicks" Daily Activity     Outcome Measure Help from another person eating meals?: A Little Help from another person taking care of personal grooming?: A Lot Help from another person toileting, which includes using toliet, bedpan, or urinal?: A Lot Help from another person bathing (including washing, rinsing, drying)?: Total Help from another person to put on and taking off regular upper body clothing?: A Lot Help from another person to put on and taking off regular lower body clothing?: Total 6 Click Score: 11   End of Session Equipment Utilized During Treatment: Gait belt;Oxygen Nurse Communication: Mobility status  Activity Tolerance: Patient tolerated treatment well;Patient limited by fatigue Patient left: in bed;with call bell/phone within reach;with bed alarm set;with family/visitor present  OT Visit Diagnosis: Unsteadiness on feet (R26.81);Muscle weakness (generalized) (M62.81)                Time: 1610-9604 OT Time Calculation (min): 47 min Charges:  OT General Charges $OT Visit: 1 Visit OT Evaluation $OT Eval Moderate Complexity: 1 Mod OT Treatments $Self Care/Home Management : 8-22  mins  Kavontae Pritchard H. OTR/L Supplemental OT, Department of rehab services [phone]  Lennis Rader R H. 12/07/2020, 2:08 PM

## 2020-12-07 NOTE — Consult Note (Signed)
Referring Provider:  [facility] Primary Care Physician:  Pcp, No Primary Gastroenterologist:  Althia Forts  Reason for Consultation:  ETOH pancreatitis with necrosis  HPI: Ernest Rodgers is a 50 y.o. 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  Presented to the ED with complaints of epigastric abdominal pain and was diagnosed with acute alcohol pancreatitis.  He was briefly intubated in the ICU, extubated 8/23.  He remains here due to confusion and delirium which is slowly improving.  He also suffered a perisplenic hematoma which was embolized by IR on 8/25.  Lipase was 1290 on admission and was down to 507 the following day, not checked since.  WBC count 11.4K today, was 14.3K yesterday.  Also being treated for PNA.  Is on ceftriaxone.  Most recent CT scan of the abdomen and pelvis with contrast on 8/14 showed the following:  Progression of pancreatitis with increasing inflammatory changes surrounding the pancreas and possible areas of hypoenhancement in the body and tail of the pancreas which could reflect developing pancreatic necrosis. Small peripancreatic pseudocysts are also noted.  GI was consulted for the possible necrosis and peripancreatic pseudocysts.  He cannot provide much history due to delirium.  But he does says that his abdominal pain is much improved.  Says that he knows that he needs to stop drinking ETOH.  He says that he's never had pancreatitis in the past.  Past Medical History:  Diagnosis Date   Hypertension    Kidney stone     Past Surgical History:  Procedure Laterality Date   GASTRECTOMY     IR ANGIOGRAM VISCERAL SELECTIVE  12/06/2020   IR EMBO ART  VEN HEMORR LYMPH EXTRAV  INC GUIDE ROADMAPPING  12/06/2020   IR US GUIDE VASC ACCESS RIGHT  12/06/2020    Prior to Admission medications   Medication Sig Start Date End Date Taking? Authorizing Provider  cyclobenzaprine  (FLEXERIL) 10 MG tablet Take 1 tablet (10 mg total) by mouth 2 (two) times daily as needed for muscle spasms. Patient taking differently: Take 10 mg by mouth every 12 (twelve) hours. 11/24/20  Yes Wurst, [place], PA-C  FLUoxetine (PROZAC) 20 MG tablet Take 20 mg by mouth every morning. 11/17/20  Yes [provider]  furosemide (LASIX) 20 MG tablet Take 20 mg by mouth every morning. 10/01/20  Yes [provider]  lisinopril (ZESTRIL) 10 MG tablet Take 10 mg by mouth daily. 03/30/20  Yes [provider]  metoprolol succinate (TOPROL-XL) 50 MG 24 hr tablet Take 50 mg by mouth daily. 08/25/20  Yes [provider]  omeprazole (PRILOSEC) 20 MG capsule Take 20 mg by mouth daily. 11/20/20  Yes [provider]  ondansetron (ZOFRAN) 8 MG tablet Take 8 mg by mouth every 8 (eight) hours as needed for nausea or vomiting. 07/14/20  Yes [provider]  predniSONE (STERAPRED UNI-PAK 21 TAB) 10 MG (21) TBPK tablet Take by mouth daily. Take 6 tabs by mouth daily  for 2 days, then 5 tabs for 2 days, then 4 tabs for 2 days, then 3 tabs for 2 days, 2 tabs for 2 days, then 1 tab by mouth daily for 2 days 11/24/20  Yes Wurst, [place], PA-C    Current Facility-Administered Medications  Medication Dose Route Frequency Provider Last Rate Last Admin   0.9 %  sodium chloride infusion (Manually program via Guardrails IV Fluids)   Intravenous Once Laurin Coder, MD  Held at 12/06/20 1212   0.9 %  sodium chloride infusion  250 mL Intravenous Continuous Chesley Mires, MD 10 mL/hr at 12/06/20 2221 250 mL at 12/06/20 2221   0.9 %  sodium chloride infusion  250 mL Intravenous Continuous Maudie Mercury, MD   Stopped at 12/04/20 1427   acetaminophen (TYLENOL) suppository 650 mg  650 mg Rectal Q4H PRN Frederik Pear, MD       cefTRIAXone (ROCEPHIN) 2 g in sodium chloride 0.9 % 100 mL IVPB  2 g Intravenous Q24H Maudie Mercury, MD   Stopped at 12/06/20 1442   chlorhexidine  (PERIDEX) 0.12 % solution 15 mL  15 mL Mouth Rinse BID Olalere, Adewale A, MD   15 mL at 12/06/20 2223   chlorhexidine gluconate (MEDLINE KIT) (PERIDEX) 0.12 % solution 15 mL  15 mL Mouth Rinse BID Cristal Generous, NP   15 mL at 12/07/20 1035   Chlorhexidine Gluconate Cloth 2 % PADS 6 each  6 each Topical Daily Kipp Brood, MD   6 each at 12/07/20 0600   feeding supplement (BOOST / RESOURCE BREEZE) liquid 1 Container  1 Container Oral TID BM Olalere, Adewale A, MD   1 Container at 12/07/20 1147   fentaNYL (SUBLIMAZE) injection 25 mcg  25 mcg Intravenous Q2H PRN Maudie Mercury, MD   25 mcg at 12/04/20 1541   haloperidol lactate (HALDOL) injection 5 mg  5 mg Intravenous Q6H PRN Renee Pain, MD   5 mg at 12/04/20 2058   insulin aspart (novoLOG) injection 0-20 Units  0-20 Units Subcutaneous Q4H Henri Medal, RPH   4 Units at 12/07/20 1153   iohexol (OMNIPAQUE) 350 MG/ML injection 100 mL  100 mL Per Tube Once PRN Markus Daft, MD       ipratropium-albuterol (DUONEB) 0.5-2.5 (3) MG/3ML nebulizer solution 3 mL  3 mL Nebulization Q4H PRN Bowser, Laurel Dimmer, NP   3 mL at 12/04/20 1408   labetalol (NORMODYNE) injection 5 mg  5 mg Intravenous Q6H PRN Minor, Grace Bushy, NP       lidocaine (LIDODERM) 5 % 1 patch  1 patch Transdermal Daily PRN Sharion Settler, DO       linezolid (ZYVOX) IVPB 600 mg  600 mg Intravenous Q12H Maudie Mercury, MD 300 mL/hr at 12/07/20 0936 600 mg at 12/07/20 0936   LORazepam (ATIVAN) tablet 1-4 mg  1-4 mg Oral Q1H PRN Renee Pain, MD   1 mg at 12/07/20 0930   Or   LORazepam (ATIVAN) injection 1-4 mg  1-4 mg Intravenous Q1H PRN Renee Pain, MD   1 mg at 12/06/20 1530   magnesium sulfate IVPB 1 g 100 mL  1 g Intravenous Once Paytes, Austin A, RPH       MEDLINE mouth rinse  15 mL Mouth Rinse q12n4p Olalere, Adewale A, MD   15 mL at 12/07/20 1156   metoprolol tartrate (LOPRESSOR) injection 5 mg  5 mg Intravenous Q6H Olalere, Adewale A, MD   5 mg at 12/07/20 1154    ondansetron (ZOFRAN) injection 4 mg  4 mg Intravenous Q8H PRN Dameron, Luna Fuse, DO   4 mg at 11/27/20 1403   pantoprazole (PROTONIX) injection 40 mg  40 mg Intravenous Q24H Bowser, Shirlee Limerick E, NP   40 mg at 12/07/20 1031   potassium chloride 10 mEq in 50 mL *CENTRAL LINE* IVPB  10 mEq Intravenous Q1 Hr x 4 Ogan, Okoronkwo U, MD 50 mL/hr at 12/07/20 1209 10 mEq at 12/07/20 1209   potassium  PHOSPHATE 20 mmol in dextrose 5 % 500 mL infusion  20 mmol Intravenous Once Minor, Grace Bushy, NP 84 mL/hr at 12/07/20 1059 20 mmol at 12/07/20 1059   sodium chloride flush (NS) 0.9 % injection 10-40 mL  10-40 mL Intracatheter Q12H Olalere, Adewale A, MD   10 mL at 12/07/20 1039   sodium chloride flush (NS) 0.9 % injection 10-40 mL  10-40 mL Intracatheter PRN Olalere, Adewale A, MD   30 mL at 12/06/20 0817   TPN ADULT (ION)   Intravenous Continuous TPN Gorden Harms, RPH 60 mL/hr at 12/06/20 1700 Infusion Verify at 12/06/20 1700   TPN ADULT (ION)   Intravenous Continuous TPN Paytes, Austin A, RPH       white petrolatum (VASELINE) gel   Topical PRN Mannam, Praveen, MD   0.2 application at 61/60/73 1153    Allergies as of 11/26/2020   (No Known Allergies)    History reviewed. No pertinent family history.  Social History   Socioeconomic History   Marital status: Married    Spouse name: Not on file   Number of children: Not on file   Years of education: Not on file   Highest education level: Not on file  Occupational History   Not on file  Tobacco Use   Smoking status: Never   Smokeless tobacco: Never  Substance and Sexual Activity   Alcohol use: Not on file   Drug use: Not on file   Sexual activity: Not on file  Other Topics Concern   Not on file  Social History Narrative   Not on file   Social Determinants of Health   Financial Resource Strain: Not on file  Food Insecurity: Not on file  Transportation Needs: Not on file  Physical Activity: Not on file  Stress: Not on file  Social  Connections: Not on file  Intimate Partner Violence: Not on file    Review of Systems: ROS not able to be obtained due to patient's delirium.    Physical Exam: Vital signs in last 24 hours: Temp:  [98 F (36.7 C)-101.5 F (38.6 C)] 100 F (37.8 C) (08/26 0827) Pulse Rate:  [92-118] 92 (08/26 0800) Resp:  [15-36] 27 (08/26 0600) BP: (98-177)/(62-103) 139/67 (08/26 0800) SpO2:  [92 %-99 %] 98 % (08/26 0600) Last BM Date: 12/04/20 General:  Alert,  Well-developed, well-nourished, pleasant and cooperative in NAD; confused and hallucinating, talking to people who are not there. Head:  Normocephalic and atraumatic. Eyes:  Sclera clear, no icterus.  Conjunctiva pink. Ears:  Normal auditory acuity. Mouth:  No deformity or lesions.  Sore noted on lower lip.  Lungs:  Clear throughout to auscultation.   No wheezes, crackles, or rhonchi.  Heart:  Regular rate and rhythm; no murmurs, clicks, rubs, or gallops. Abdomen:  Soft, non-distended.  BS present.  Non-tender. Msk:  Symmetrical without gross deformities. Pulses:  Normal pulses noted. Extremities:  Without clubbing or edema. Skin:  Intact without significant lesions or rashes.  Intake/Output from previous day: 08/25 0701 - 08/26 0700 In: 1626.4 [I.V.:399.8; Blood:222; IV Piggyback:1004.6] Out: 900 [Urine:900] Intake/Output this shift: Total I/O In: 10 [I.V.:10] Out: -   Lab Results: Recent Labs    12/06/20 0038 12/06/20 0408 12/06/20 0958 12/06/20 2251 12/07/20 0414  WBC 14.3* 12.5*  --   --  11.4*  HGB 6.6* 6.8* 7.1* 6.5* 8.0*  HCT 20.8* 20.4* 21.9* 19.5* 24.9*  PLT 209 163  --   --  210   BMET Recent  Labs    12/05/20 0150 12/05/20 0416 12/06/20 0730 12/07/20 0414  NA 151* 154* 151* 149*  K 3.3* 3.4* 3.2* 3.5  CL 119*  --  119* 115*  CO2 23  --  23 23  GLUCOSE 160*  --  181* 213*  BUN 25*  --  35* 21*  CREATININE 0.92  --  0.87 0.78  CALCIUM 8.3*  --  8.1* 8.7*   LFT Recent Labs    12/06/20 0730  PROT  5.1*  ALBUMIN 2.4*  AST 43*  ALT 29  ALKPHOS 59  BILITOT 1.7*   PT/INR Recent Labs    12/05/20 1219  LABPROT 17.7*  INR 1.5*    Studies/Results: IR Angiogram Visceral Selective  Result Date: 12/06/2020 INDICATION: 50 year old with a large subcapsular splenic hematoma and decreasing hemoglobin. Plan for arteriography and splenic artery embolization. EXAM: 1. Visceral angiography with selective injection of the celiac artery trunk and splenic artery 2. Coil embolization of the splenic artery 3. Ultrasound guidance for vascular access MEDICATIONS: Moderate sedation ANESTHESIA/SEDATION: Moderate (conscious) sedation was employed during this procedure. A total of Versed 6.0 mg and Fentanyl 200 mcg was administered intravenously. Moderate Sedation Time: 96 minutes. The patient's level of consciousness and vital signs were monitored continuously by radiology nursing throughout the procedure under my direct supervision. CONTRAST:  60 mL Omnipaque 350 FLUOROSCOPY TIME:  Fluoroscopy Time: 22 minutes, 24 seconds, 2,671 mGy COMPLICATIONS: None immediate. PROCEDURE: Informed consent was obtained from the patient following explanation of the procedure, risks, benefits and alternatives. The patient understands, agrees and consents for the procedure. All questions were addressed. A time out was performed prior to the initiation of the procedure. Patient was placed supine. Ultrasound confirmed a patent right common femoral artery. Ultrasound image was saved for documentation. Right groin was prepped and draped in sterile fashion. Maximal barrier sterile technique was utilized including caps, mask, sterile gowns, sterile gloves, sterile drape, hand hygiene and skin antiseptic. Right groin was anesthetized with 1% lidocaine. Small incision was made. Using ultrasound guidance, 21 gauge needle was directed into the right common femoral artery and micropuncture dilator set was placed. Five French vascular sheath was  placed. Bentson wire was advanced into the abdominal aorta. C2 catheter was used to cannulate the celiac trunk. Celiac artery arteriography was performed. A Lantern microcatheter was advanced into the splenic artery and additional angiography was performed. Catheter was positioned in the midportion of the main splenic artery. Multiple detachable coils were placed within the splenic artery under fluoroscopic guidance. Combination of Ruby, Interlock andIDC coils were used and the coil sizes included 8 mm, 6 mm and 5 mm. Follow-up angiography was performed following placement of the coils. The microcatheter was removed and the 5 French catheter was removed. Arteriogram was performed through the right groin sheath. Right groin sheath was removed using an Angio-Seal closure device. Right groin hemostasis at the end of the procedure. FINDINGS: Celiac trunk is patent. Common hepatic artery and splenic artery are widely patent. The splenic artery is very tortuous. Microcatheter was advanced into the midportion of the main splenic artery. Multiple coils are packed within the midportion of the splenic artery. At the end of the procedure, there was near stagnant flow within the proximal splenic artery. There was some residual filling of a branch supplying the superior aspect of the spleen. IMPRESSION: Successful coil embolization of the main splenic artery. Electronically Signed   By: Markus Daft M.D.   On: 12/06/2020 17:20   IR US Guide  Vasc Access Right  Result Date: 12/06/2020 INDICATION: 50 year old with a large subcapsular splenic hematoma and decreasing hemoglobin. Plan for arteriography and splenic artery embolization. EXAM: 1. Visceral angiography with selective injection of the celiac artery trunk and splenic artery 2. Coil embolization of the splenic artery 3. Ultrasound guidance for vascular access MEDICATIONS: Moderate sedation ANESTHESIA/SEDATION: Moderate (conscious) sedation was employed during this  procedure. A total of Versed 6.0 mg and Fentanyl 200 mcg was administered intravenously. Moderate Sedation Time: 96 minutes. The patient's level of consciousness and vital signs were monitored continuously by radiology nursing throughout the procedure under my direct supervision. CONTRAST:  60 mL Omnipaque 350 FLUOROSCOPY TIME:  Fluoroscopy Time: 22 minutes, 24 seconds, 1,478 mGy COMPLICATIONS: None immediate. PROCEDURE: Informed consent was obtained from the patient following explanation of the procedure, risks, benefits and alternatives. The patient understands, agrees and consents for the procedure. All questions were addressed. A time out was performed prior to the initiation of the procedure. Patient was placed supine. Ultrasound confirmed a patent right common femoral artery. Ultrasound image was saved for documentation. Right groin was prepped and draped in sterile fashion. Maximal barrier sterile technique was utilized including caps, mask, sterile gowns, sterile gloves, sterile drape, hand hygiene and skin antiseptic. Right groin was anesthetized with 1% lidocaine. Small incision was made. Using ultrasound guidance, 21 gauge needle was directed into the right common femoral artery and micropuncture dilator set was placed. Five French vascular sheath was placed. Bentson wire was advanced into the abdominal aorta. C2 catheter was used to cannulate the celiac trunk. Celiac artery arteriography was performed. A Lantern microcatheter was advanced into the splenic artery and additional angiography was performed. Catheter was positioned in the midportion of the main splenic artery. Multiple detachable coils were placed within the splenic artery under fluoroscopic guidance. Combination of Ruby, Interlock andIDC coils were used and the coil sizes included 8 mm, 6 mm and 5 mm. Follow-up angiography was performed following placement of the coils. The microcatheter was removed and the 5 French catheter was removed.  Arteriogram was performed through the right groin sheath. Right groin sheath was removed using an Angio-Seal closure device. Right groin hemostasis at the end of the procedure. FINDINGS: Celiac trunk is patent. Common hepatic artery and splenic artery are widely patent. The splenic artery is very tortuous. Microcatheter was advanced into the midportion of the main splenic artery. Multiple coils are packed within the midportion of the splenic artery. At the end of the procedure, there was near stagnant flow within the proximal splenic artery. There was some residual filling of a branch supplying the superior aspect of the spleen. IMPRESSION: Successful coil embolization of the main splenic artery. Electronically Signed   By: Markus Daft M.D.   On: 12/06/2020 17:20   IR EMBO ART  VEN HEMORR LYMPH EXTRAV  INC GUIDE ROADMAPPING  Result Date: 12/06/2020 INDICATION: 50 year old with a large subcapsular splenic hematoma and decreasing hemoglobin. Plan for arteriography and splenic artery embolization. EXAM: 1. Visceral angiography with selective injection of the celiac artery trunk and splenic artery 2. Coil embolization of the splenic artery 3. Ultrasound guidance for vascular access MEDICATIONS: Moderate sedation ANESTHESIA/SEDATION: Moderate (conscious) sedation was employed during this procedure. A total of Versed 6.0 mg and Fentanyl 200 mcg was administered intravenously. Moderate Sedation Time: 96 minutes. The patient's level of consciousness and vital signs were monitored continuously by radiology nursing throughout the procedure under my direct supervision. CONTRAST:  60 mL Omnipaque 350 FLUOROSCOPY TIME:  Fluoroscopy Time:  22 minutes, 24 seconds, 2,010 mGy COMPLICATIONS: None immediate. PROCEDURE: Informed consent was obtained from the patient following explanation of the procedure, risks, benefits and alternatives. The patient understands, agrees and consents for the procedure. All questions were addressed. A  time out was performed prior to the initiation of the procedure. Patient was placed supine. Ultrasound confirmed a patent right common femoral artery. Ultrasound image was saved for documentation. Right groin was prepped and draped in sterile fashion. Maximal barrier sterile technique was utilized including caps, mask, sterile gowns, sterile gloves, sterile drape, hand hygiene and skin antiseptic. Right groin was anesthetized with 1% lidocaine. Small incision was made. Using ultrasound guidance, 21 gauge needle was directed into the right common femoral artery and micropuncture dilator set was placed. Five French vascular sheath was placed. Bentson wire was advanced into the abdominal aorta. C2 catheter was used to cannulate the celiac trunk. Celiac artery arteriography was performed. A Lantern microcatheter was advanced into the splenic artery and additional angiography was performed. Catheter was positioned in the midportion of the main splenic artery. Multiple detachable coils were placed within the splenic artery under fluoroscopic guidance. Combination of Ruby, Interlock andIDC coils were used and the coil sizes included 8 mm, 6 mm and 5 mm. Follow-up angiography was performed following placement of the coils. The microcatheter was removed and the 5 French catheter was removed. Arteriogram was performed through the right groin sheath. Right groin sheath was removed using an Angio-Seal closure device. Right groin hemostasis at the end of the procedure. FINDINGS: Celiac trunk is patent. Common hepatic artery and splenic artery are widely patent. The splenic artery is very tortuous. Microcatheter was advanced into the midportion of the main splenic artery. Multiple coils are packed within the midportion of the splenic artery. At the end of the procedure, there was near stagnant flow within the proximal splenic artery. There was some residual filling of a branch supplying the superior aspect of the spleen.  IMPRESSION: Successful coil embolization of the main splenic artery. Electronically Signed   By: Markus Daft M.D.   On: 12/06/2020 17:20   [place] EKG SITE RITE  Result Date: 12/05/2020 If Site Rite image not attached, placement could not be confirmed due to current cardiac rhythm.   IMPRESSION:  *Alcohol Pancreatitis - now with possible developing pancreatic necrosis and small peripancreatic pseudocysts on CT 8/24. He is on Rocephin since 8/22.  Triglycerides were almost 400 but down now.  LFT were elevated likely due to ETOH use and steatosis seen on imaging, they have improved/almost normalized.  On TNA.  WBC count 11.3.  did have a temp of 101.5 overnight. *Subcapsular hematoma of the spleen with associated ABL anemia  - s/p IR splenic artery embolization 8/25 *ETOH abuse- has been confused and hallucinating.  Think that he could still be going through withdrawal.  On CIWA protocol.  PLAN: -Likely just going to continue conservative/supportive care with IV hydration, pain control, etc. for now. -Continue antibiotics for now. -Needs complete ETOH cessation.   Laban Emperor. Docia Klar  12/07/2020, 12:20 PM

## 2020-12-07 NOTE — Progress Notes (Signed)
Assisted video call with wife

## 2020-12-07 NOTE — Progress Notes (Signed)
[facility] ADULT ICU REPLACEMENT PROTOCOL   The patient does apply for the [facility] Adult ICU Electrolyte Replacment Protocol based on the criteria listed below:   1.Exclusion criteria: TCTS patients, ECMO patients and Hypothermia Protocol, and   Dialysis patients 2. Is GFR >/= 30 ml/min? Yes.    Patient's GFR today is >60 3. Is SCr </= 2? No. Patient's SCr is 0.78 mg/dL 4. Did SCr increase >/= 0.5 in 24 hours? No. 5.Pt's weight >40kg  Yes.   6. Abnormal electrolyte(s): K+ 3.5  7. Electrolytes replaced per protocol 8.  Call MD STAT for K+ </= 2.5, Phos </= 1, or Mag </= 1 Physician:  n/a  Ernest Rodgers 12/07/2020 6:07 AM

## 2020-12-07 NOTE — Progress Notes (Signed)
eLink Physician-Brief Progress Note Patient Name: Ernest Rodgers DOB: 05-01-1970 MRN: [id number]   Date of Service  12/07/2020  HPI/Events of Note  BP 160/89, patient has had ongoing requirement for blood transfusion secondary to sub-capsular hematoma involving the spleen with acute blood loss anemia,  he is s/p IR embolization.  eICU Interventions  Will monitor BP without treatment for now as patient is at very high risk for iatrogenic hypotension.        Thomasene Lot Robyn Nohr 12/07/2020, 3:37 AM

## 2020-12-07 NOTE — Progress Notes (Signed)
Inpatient Rehab Admissions Coordinator Note:  Per PT recommendations, pt was screened for candidacy for CIR by Stephania Fragmin, PT.  At this time, pt does not appear to be able to tolerate the intensity of our CIR program at this time.  No consult recommended, however CIR will follow from a distance and if pt demonstrates improved tolerance we will request/place an order at that time per our protocol.    Estill Dooms, PT, DPT [phone] @TODAY @ 1:56 PM

## 2020-12-07 NOTE — Progress Notes (Signed)
Referring Physician(s): Barnetta Chapel, PA-C  Supervising Physician: Irish Lack  Patient Status:  [facility] - In-pt  Chief Complaint: Splenic hemorrhage s/p splenic artery embolization 12/06/20 with Dr. Lowella Dandy  Subjective: Patient sitting up in bed, his wife is in the room. He is drowsy but conversational and answers all questions appropriately. Mild mental deficits with some delayed responses, forgetfulness and repetitive statements.   Allergies: Patient has no known allergies.  Medications: Prior to Admission medications   Medication Sig Start Date End Date Taking? Authorizing Provider  cyclobenzaprine (FLEXERIL) 10 MG tablet Take 1 tablet (10 mg total) by mouth 2 (two) times daily as needed for muscle spasms. Patient taking differently: Take 10 mg by mouth every 12 (twelve) hours. 11/24/20  Yes Wurst, [place], PA-C  FLUoxetine (PROZAC) 20 MG tablet Take 20 mg by mouth every morning. 11/17/20  Yes [provider]  furosemide (LASIX) 20 MG tablet Take 20 mg by mouth every morning. 10/01/20  Yes [provider]  lisinopril (ZESTRIL) 10 MG tablet Take 10 mg by mouth daily. 03/30/20  Yes [provider]  metoprolol succinate (TOPROL-XL) 50 MG 24 hr tablet Take 50 mg by mouth daily. 08/25/20  Yes [provider]  omeprazole (PRILOSEC) 20 MG capsule Take 20 mg by mouth daily. 11/20/20  Yes [provider]  ondansetron (ZOFRAN) 8 MG tablet Take 8 mg by mouth every 8 (eight) hours as needed for nausea or vomiting. 07/14/20  Yes [provider]  predniSONE (STERAPRED UNI-PAK 21 TAB) 10 MG (21) TBPK tablet Take by mouth daily. Take 6 tabs by mouth daily  for 2 days, then 5 tabs for 2 days, then 4 tabs for 2 days, then 3 tabs for 2 days, 2 tabs for 2 days, then 1 tab by mouth daily for 2 days 11/24/20  Yes Wurst, [place], PA-C     Vital Signs: BP 139/67   Pulse 92   Temp 100 F (37.8 C) (Rectal)   Resp (!) 27   Ht 5\' 10"  (1.778 m)   Wt 270 lb  4.5 oz (122.6 kg)   SpO2 98%   BMI 38.78 kg/m   Physical Exam Constitutional:      General: He is not in acute distress. Cardiovascular:     Rate and Rhythm: Normal rate and regular rhythm.     Comments: Right groin vascular access site surrounding tissue is soft and non-tender. Dressing has serosanguineous shadowing. Skin puncture site is clean, dry and without drainage, redness or significant tenderness.  Pulmonary:     Effort: Pulmonary effort is normal.  Abdominal:     Tenderness: There is no abdominal tenderness.  Skin:    General: Skin is warm and dry.  Neurological:     Mental Status: He is alert.     Comments: Alert and oriented with some delayed responses, forgetfulness and repetitive statements.     Imaging: CT Angio Chest Pulmonary Embolism (PE) W or WO Contrast  Result Date: 12/05/2020 CLINICAL DATA:  51 year old male with history of tachycardia and tachypnea. Pancreatitis. Abdominal distension. Fevers. EXAM: CT ANGIOGRAPHY CHEST CT ABDOMEN AND PELVIS WITH CONTRAST TECHNIQUE: Multidetector CT imaging of the chest was performed using the standard protocol during bolus administration of intravenous contrast. Multiplanar CT image reconstructions and MIPs were obtained to evaluate the vascular anatomy. Multidetector CT imaging of the abdomen and pelvis was performed using the standard protocol during bolus administration of intravenous contrast. CONTRAST:  44 OMNIPAQUE IOHEXOL 350 MG/ML SOLN COMPARISON:  CT of  the abdomen and pelvis 12/02/2020. Chest CT 11/26/2020. FINDINGS: CTA CHEST FINDINGS Cardiovascular: There are no filling defects within the pulmonary arterial tree to suggest pulmonary embolism. Heart size is normal. Trace amount of pericardial fluid and/or thickening, unlikely to be of hemodynamic significance at this time. No associated pericardial calcification. There is aortic atherosclerosis, as well as atherosclerosis of the great vessels of the mediastinum and the  coronary arteries, including calcified atherosclerotic plaque in the left main, left anterior descending, left circumflex and right coronary arteries. Mediastinum/Nodes: No pathologically enlarged mediastinal or hilar lymph nodes. Esophagus is unremarkable in appearance. No axillary lymphadenopathy. Lungs/Pleura: Resolving areas of airspace consolidation are noted throughout the lungs bilaterally, most evident in the lower lobes of the lungs bilaterally where there continues to be extensive subsegmental atelectasis and some residual airspace disease. Trace bilateral pleural effusions lying dependently. Musculoskeletal: There are no aggressive appearing lytic or blastic lesions noted in the visualized portions of the skeleton. Review of the MIP images confirms the above findings. CT ABDOMEN and PELVIS FINDINGS Hepatobiliary: Diffuse low attenuation throughout the hepatic parenchyma, indicative of hepatic steatosis. No suspicious cystic or solid hepatic lesions. No intra or extrahepatic biliary ductal dilatation. Status post cholecystectomy. Pancreas: No discrete pancreatic mass confidently identified. There continues to be a small amount of peripancreatic fluid and extensive peripancreatic inflammatory changes indicative of acute pancreatitis. Portions of the pancreatic parenchyma do not enhance well, particularly in the body and tail of the pancreas, concerning for areas of potential pancreatic necrosis (although assessment is limited by suboptimal contrast bolus). Some of the peripancreatic fluid appears to be organizing into small pseudo cysts, largest of which is superior to the proximal pancreatic body (axial image 27 of series 6) measuring 3.8 x 2.0 cm. Spleen: Large subcapsular hematoma in the spleen common new compared to the prior study. Adrenals/Urinary Tract: Bilateral kidneys and adrenal glands are normal in appearance. No hydroureteronephrosis. Urinary bladder is nearly completely decompressed around an  indwelling Foley balloon catheter. 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 Several dilated loops of small bowel are noted in the central abdomen measuring up to 4.8 cm in diameter, improved compared to the prior study. Oral contrast material on today's examination is noted throughout the distal small bowel and colon, indicating that small-bowel findings are more likely related to ileus than partial obstruction. Rectal tube noted. Vascular/Lymphatic: Aortic atherosclerosis, without evidence of aneurysm or dissection in the abdominal or pelvic vasculature. No lymphadenopathy noted in the abdomen or pelvis. Reproductive: Status post hysterectomy. Ovaries are not confidently identified may be surgically absent or atrophic. Other: Tracking inferiorly from the spleen along the left pericolic gutter there is a moderate amount of high attenuation fluid, indicative of hemoperitoneum. Small to moderate volume of ascites elsewhere in the abdomen ranging from low to intermediate attenuation, presumably reflective of hemorrhagic contents. No pneumoperitoneum. Musculoskeletal: There are no aggressive appearing lytic or blastic lesions noted in the visualized portions of the skeleton. Review of the MIP images confirms the above findings. IMPRESSION: 1. New large subcapsular hematoma associated with the spleen. Adjacent to this there is hemorrhagic fluid in the peritoneal cavity (hemoperitoneum) tracking down the left pericolic gutter extending into the pelvis. Although active extravasation is not confidently identified on today's examination, this hematoma likely rapidly expanded, and the possibility of active bleeding should be considered. 2. Progression of pancreatitis with increasing inflammatory changes surrounding the pancreas and possible areas of hypoenhancement in the body and tail of the pancreas which could  reflect developing pancreatic necrosis. Small peripancreatic  pseudocysts are also noted. 3. No evidence of pulmonary embolism. 4. Resolving bilateral lower lobe pneumonia with residual areas of subsegmental atelectasis and a small amount of residual airspace consolidation. Trace bilateral parapneumonic pleural effusions. 5. Hepatic steatosis. 6. Aortic atherosclerosis, in addition to left main and 3 vessel coronary artery disease. Please note that although the presence of coronary artery calcium documents the presence of coronary artery disease, the severity of this disease and any potential stenosis cannot be assessed on this non-gated CT examination. Assessment for potential risk factor modification, dietary therapy or pharmacologic therapy may be warranted, if clinically indicated. 7. Additional incidental findings, as above. Critical Value/emergent results were called by telephone at the time of interpretation on 12/05/2020 at 11:31 am to provider Virl Diamond, who verbally acknowledged these results. Electronically Signed   By: Trudie Reed M.D.   On: 12/05/2020 11:34   CT ABDOMEN PELVIS W CONTRAST  Result Date: 12/05/2020 CLINICAL DATA:  50 year old male with history of tachycardia and tachypnea. Pancreatitis. Abdominal distension. Fevers. EXAM: CT ANGIOGRAPHY CHEST CT ABDOMEN AND PELVIS WITH CONTRAST TECHNIQUE: Multidetector CT imaging of the chest was performed using the standard protocol during bolus administration of intravenous contrast. Multiplanar CT image reconstructions and MIPs were obtained to evaluate the vascular anatomy. Multidetector CT imaging of the abdomen and pelvis was performed using the standard protocol during bolus administration of intravenous contrast. CONTRAST:  OMNIPAQUE IOHEXOL 350 MG/ML SOLN COMPARISON:  CT of the abdomen and pelvis 12/02/2020. Chest CT 11/26/2020. FINDINGS: CTA CHEST FINDINGS Cardiovascular: There are no filling defects within the pulmonary arterial tree to suggest pulmonary embolism. Heart size is normal.  Trace amount of pericardial fluid and/or thickening, unlikely to be of hemodynamic significance at this time. No associated pericardial calcification. There is aortic atherosclerosis, as well as atherosclerosis of the great vessels of the mediastinum and the coronary arteries, including calcified atherosclerotic plaque in the left main, left anterior descending, left circumflex and right coronary arteries. Mediastinum/Nodes: No pathologically enlarged mediastinal or hilar lymph nodes. Esophagus is unremarkable in appearance. No axillary lymphadenopathy. Lungs/Pleura: Resolving areas of airspace consolidation are noted throughout the lungs bilaterally, most evident in the lower lobes of the lungs bilaterally where there continues to be extensive subsegmental atelectasis and some residual airspace disease. Trace bilateral pleural effusions lying dependently. Musculoskeletal: There are no aggressive appearing lytic or blastic lesions noted in the visualized portions of the skeleton. Review of the MIP images confirms the above findings. CT ABDOMEN and PELVIS FINDINGS Hepatobiliary: Diffuse low attenuation throughout the hepatic parenchyma, indicative of hepatic steatosis. No suspicious cystic or solid hepatic lesions. No intra or extrahepatic biliary ductal dilatation. Status post cholecystectomy. Pancreas: No discrete pancreatic mass confidently identified. There continues to be a small amount of peripancreatic fluid and extensive peripancreatic inflammatory changes indicative of acute pancreatitis. Portions of the pancreatic parenchyma do not enhance well, particularly in the body and tail of the pancreas, concerning for areas of potential pancreatic necrosis (although assessment is limited by suboptimal contrast bolus). Some of the peripancreatic fluid appears to be organizing into small pseudo cysts, largest of which is superior to the proximal pancreatic body (axial image 27 of series 6) measuring 3.8 x 2.0 cm.  Spleen: Large subcapsular hematoma in the spleen common new compared to the prior study. Adrenals/Urinary Tract: Bilateral kidneys and adrenal glands are normal in appearance. No hydroureteronephrosis. Urinary bladder is nearly completely decompressed around an indwelling Foley balloon catheter. Stomach/Bowel: Postoperative changes of  prior Roux-en-Y gastric bypass are again noted. Several dilated loops of small bowel are noted in the central abdomen measuring up to 4.8 cm in diameter, improved compared to the prior study. Oral contrast material on today's examination is noted throughout the distal small bowel and colon, indicating that small-bowel findings are more likely related to ileus than partial obstruction. Rectal tube noted. Vascular/Lymphatic: Aortic atherosclerosis, without evidence of aneurysm or dissection in the abdominal or pelvic vasculature. No lymphadenopathy noted in the abdomen or pelvis. Reproductive: Status post hysterectomy. Ovaries are not confidently identified may be surgically absent or atrophic. Other: Tracking inferiorly from the spleen along the left pericolic gutter there is a moderate amount of high attenuation fluid, indicative of hemoperitoneum. Small to moderate volume of ascites elsewhere in the abdomen ranging from low to intermediate attenuation, presumably reflective of hemorrhagic contents. No pneumoperitoneum. Musculoskeletal: There are no aggressive appearing lytic or blastic lesions noted in the visualized portions of the skeleton. Review of the MIP images confirms the above findings. IMPRESSION: 1. New large subcapsular hematoma associated with the spleen. Adjacent to this there is hemorrhagic fluid in the peritoneal cavity (hemoperitoneum) tracking down the left pericolic gutter extending into the pelvis. Although active extravasation is not confidently identified on today's examination, this hematoma likely rapidly expanded, and the possibility of active bleeding should  be considered. 2. Progression of pancreatitis with increasing inflammatory changes surrounding the pancreas and possible areas of hypoenhancement in the body and tail of the pancreas which could reflect developing pancreatic necrosis. Small peripancreatic pseudocysts are also noted. 3. No evidence of pulmonary embolism. 4. Resolving bilateral lower lobe pneumonia with residual areas of subsegmental atelectasis and a small amount of residual airspace consolidation. Trace bilateral parapneumonic pleural effusions. 5. Hepatic steatosis. 6. Aortic atherosclerosis, in addition to left main and 3 vessel coronary artery disease. Please note that although the presence of coronary artery calcium documents the presence of coronary artery disease, the severity of this disease and any potential stenosis cannot be assessed on this non-gated CT examination. Assessment for potential risk factor modification, dietary therapy or pharmacologic therapy may be warranted, if clinically indicated. 7. Additional incidental findings, as above. Critical Value/emergent results were called by telephone at the time of interpretation on 12/05/2020 at 11:31 am to provider Virl Diamond, who verbally acknowledged these results. Electronically Signed   By: Trudie Reed M.D.   On: 12/05/2020 11:34   IR Angiogram Visceral Selective  Result Date: 12/06/2020 INDICATION: 50 year old with a large subcapsular splenic hematoma and decreasing hemoglobin. Plan for arteriography and splenic artery embolization. EXAM: 1. Visceral angiography with selective injection of the celiac artery trunk and splenic artery 2. Coil embolization of the splenic artery 3. Ultrasound guidance for vascular access MEDICATIONS: Moderate sedation ANESTHESIA/SEDATION: Moderate (conscious) sedation was employed during this procedure. A total of Versed 6.0 mg and Fentanyl 200 mcg was administered intravenously. Moderate Sedation Time: 96 minutes. The patient's level of  consciousness and vital signs were monitored continuously by radiology nursing throughout the procedure under my direct supervision. CONTRAST:  60 mL Omnipaque 350 FLUOROSCOPY TIME:  Fluoroscopy Time: 22 minutes, 24 seconds, 1,604 mGy COMPLICATIONS: None immediate. PROCEDURE: Informed consent was obtained from the patient following explanation of the procedure, risks, benefits and alternatives. The patient understands, agrees and consents for the procedure. All questions were addressed. A time out was performed prior to the initiation of the procedure. Patient was placed supine. Ultrasound confirmed a patent right common femoral artery. Ultrasound image was saved for documentation.  Right groin was prepped and draped in sterile fashion. Maximal barrier sterile technique was utilized including caps, mask, sterile gowns, sterile gloves, sterile drape, hand hygiene and skin antiseptic. Right groin was anesthetized with 1% lidocaine. Small incision was made. Using ultrasound guidance, 21 gauge needle was directed into the right common femoral artery and micropuncture dilator set was placed. Five French vascular sheath was placed. Bentson wire was advanced into the abdominal aorta. C2 catheter was used to cannulate the celiac trunk. Celiac artery arteriography was performed. A Lantern microcatheter was advanced into the splenic artery and additional angiography was performed. Catheter was positioned in the midportion of the main splenic artery. Multiple detachable coils were placed within the splenic artery under fluoroscopic guidance. Combination of Ruby, Interlock andIDC coils were used and the coil sizes included 8 mm, 6 mm and 5 mm. Follow-up angiography was performed following placement of the coils. The microcatheter was removed and the 5 French catheter was removed. Arteriogram was performed through the right groin sheath. Right groin sheath was removed using an Angio-Seal closure device. Right groin hemostasis at  the end of the procedure. FINDINGS: Celiac trunk is patent. Common hepatic artery and splenic artery are widely patent. The splenic artery is very tortuous. Microcatheter was advanced into the midportion of the main splenic artery. Multiple coils are packed within the midportion of the splenic artery. At the end of the procedure, there was near stagnant flow within the proximal splenic artery. There was some residual filling of a branch supplying the superior aspect of the spleen. IMPRESSION: Successful coil embolization of the main splenic artery. Electronically Signed   By: Richarda OverlieAdam  Henn M.D.   On: 12/06/2020 17:20   IR US Guide Vasc Access Right  Result Date: 12/06/2020 INDICATION: 50 year old with a large subcapsular splenic hematoma and decreasing hemoglobin. Plan for arteriography and splenic artery embolization. EXAM: 1. Visceral angiography with selective injection of the celiac artery trunk and splenic artery 2. Coil embolization of the splenic artery 3. Ultrasound guidance for vascular access MEDICATIONS: Moderate sedation ANESTHESIA/SEDATION: Moderate (conscious) sedation was employed during this procedure. A total of Versed 6.0 mg and Fentanyl 200 mcg was administered intravenously. Moderate Sedation Time: 96 minutes. The patient's level of consciousness and vital signs were monitored continuously by radiology nursing throughout the procedure under my direct supervision. CONTRAST:  60 mL Omnipaque 350 FLUOROSCOPY TIME:  Fluoroscopy Time: 22 minutes, 24 seconds, 1,604 mGy COMPLICATIONS: None immediate. PROCEDURE: Informed consent was obtained from the patient following explanation of the procedure, risks, benefits and alternatives. The patient understands, agrees and consents for the procedure. All questions were addressed. A time out was performed prior to the initiation of the procedure. Patient was placed supine. Ultrasound confirmed a patent right common femoral artery. Ultrasound image was saved for  documentation. Right groin was prepped and draped in sterile fashion. Maximal barrier sterile technique was utilized including caps, mask, sterile gowns, sterile gloves, sterile drape, hand hygiene and skin antiseptic. Right groin was anesthetized with 1% lidocaine. Small incision was made. Using ultrasound guidance, 21 gauge needle was directed into the right common femoral artery and micropuncture dilator set was placed. Five French vascular sheath was placed. Bentson wire was advanced into the abdominal aorta. C2 catheter was used to cannulate the celiac trunk. Celiac artery arteriography was performed. A Lantern microcatheter was advanced into the splenic artery and additional angiography was performed. Catheter was positioned in the midportion of the main splenic artery. Multiple detachable coils were placed within the splenic  artery under fluoroscopic guidance. Combination of Ruby, Interlock andIDC coils were used and the coil sizes included 8 mm, 6 mm and 5 mm. Follow-up angiography was performed following placement of the coils. The microcatheter was removed and the 5 French catheter was removed. Arteriogram was performed through the right groin sheath. Right groin sheath was removed using an Angio-Seal closure device. Right groin hemostasis at the end of the procedure. FINDINGS: Celiac trunk is patent. Common hepatic artery and splenic artery are widely patent. The splenic artery is very tortuous. Microcatheter was advanced into the midportion of the main splenic artery. Multiple coils are packed within the midportion of the splenic artery. At the end of the procedure, there was near stagnant flow within the proximal splenic artery. There was some residual filling of a branch supplying the superior aspect of the spleen. IMPRESSION: Successful coil embolization of the main splenic artery. Electronically Signed   By: Richarda Overlie M.D.   On: 12/06/2020 17:20   DG Chest Port 1 View  Result Date:  12/05/2020 CLINICAL DATA:  50 year old male with history of acute respiratory failure. Pneumonia. EXAM: PORTABLE CHEST 1 VIEW COMPARISON:  Chest x-ray 12/03/2020. FINDINGS: Lung volumes are low. There are some patchy bibasilar opacities in the medial aspect of the left lower lobe and lateral aspect of the right lower lobe. Possible trace right pleural effusion. No definite left pleural effusion. No pneumothorax. No evidence of pulmonary edema. Heart size is within normal limits allowing for low lung volumes and portable AP technique. The patient is rotated to the left on today's exam, resulting in distortion of the mediastinal contours and reduced diagnostic sensitivity and specificity for mediastinal pathology. IMPRESSION: 1. Low lung volumes with bibasilar opacities which may reflect areas of atelectasis and/or consolidation. Possible trace right pleural effusion. Electronically Signed   By: Trudie Reed M.D.   On: 12/05/2020 05:32   DG Abd Portable 1V  Result Date: 12/05/2020 CLINICAL DATA:  Shortness of breath.  Abdominal distention. EXAM: PORTABLE ABDOMEN - 1 VIEW COMPARISON:  Earlier today FINDINGS: There is diffuse distention of the small and large bowel loops. The cecum measures approximately 12.8 cm. This is similar to previous exam. Small bowel loops measure up to 5.8 cm. Enteric contrast material identified within the distal descending colon and rectum. IMPRESSION: Persistent gaseous distention of the small and large bowel loops compatible with ileus. Enteric contrast material is again noted within the distal colon. Electronically Signed   By: Signa Kell M.D.   On: 12/05/2020 14:32   DG Abd Portable 1V  Result Date: 12/04/2020 CLINICAL DATA:  Small boxer per EXAM: PORTABLE ABDOMEN - 1 VIEW COMPARISON:  [facility], most recently 12/03/2020. CT pelvis, 12/02/2020. FINDINGS: Support lines: Nasogastric tube, with tip unchanged and projecting within small bowel status post gastric bypass. Foley  catheter, with temperature sensing probe. Similar appearance of multiple air-and-fluid distended central small bowel loops, greatest at the RIGHT lower quadrant and measuring up to 10.0 cm. Cholecystectomy clips. Surgical clips additional staple line in surgical clips at the epigastrium, consistent with known gastric bypass. No osseous abnormality. IMPRESSION: 1. Lines and tubes are unchanged in position. 2. Persistently dilated bowel loops, consistent with known bowel obstruction. Electronically Signed   By: Roanna Banning M.D.   On: 12/04/2020 08:09   DG Abd Portable 1V-Small Bowel Obstruction Protocol-initial, 8 hr delay  Result Date: 12/03/2020 CLINICAL DATA:  Small-bowel obstruction EXAM: PORTABLE ABDOMEN - 1 VIEW COMPARISON:  Same day abdominal radiographs, CT abdomen/pelvis 1  day prior FINDINGS: The enteric catheter tip projects over the mid abdomen likely in the small bowel status post gastric bypass. Multiple dilated loops of bowel are again seen throughout the abdomen, the largest in the right lower quadrant measuring up to 10.0 cm, overall unchanged since the radiographs obtained earlier the same day. IMPRESSION: No significant interval change compared to the KUB obtained earlier the same day. Electronically Signed   By: Lesia Hausen M.D.   On: 12/03/2020 19:35   IR EMBO ART  VEN HEMORR LYMPH EXTRAV  INC GUIDE ROADMAPPING  Result Date: 12/06/2020 INDICATION: 50 year old with a large subcapsular splenic hematoma and decreasing hemoglobin. Plan for arteriography and splenic artery embolization. EXAM: 1. Visceral angiography with selective injection of the celiac artery trunk and splenic artery 2. Coil embolization of the splenic artery 3. Ultrasound guidance for vascular access MEDICATIONS: Moderate sedation ANESTHESIA/SEDATION: Moderate (conscious) sedation was employed during this procedure. A total of Versed 6.0 mg and Fentanyl 200 mcg was administered intravenously. Moderate Sedation Time: 96  minutes. The patient's level of consciousness and vital signs were monitored continuously by radiology nursing throughout the procedure under my direct supervision. CONTRAST:  60 mL Omnipaque 350 FLUOROSCOPY TIME:  Fluoroscopy Time: 22 minutes, 24 seconds, 1,604 mGy COMPLICATIONS: None immediate. PROCEDURE: Informed consent was obtained from the patient following explanation of the procedure, risks, benefits and alternatives. The patient understands, agrees and consents for the procedure. All questions were addressed. A time out was performed prior to the initiation of the procedure. Patient was placed supine. Ultrasound confirmed a patent right common femoral artery. Ultrasound image was saved for documentation. Right groin was prepped and draped in sterile fashion. Maximal barrier sterile technique was utilized including caps, mask, sterile gowns, sterile gloves, sterile drape, hand hygiene and skin antiseptic. Right groin was anesthetized with 1% lidocaine. Small incision was made. Using ultrasound guidance, 21 gauge needle was directed into the right common femoral artery and micropuncture dilator set was placed. Five French vascular sheath was placed. Bentson wire was advanced into the abdominal aorta. C2 catheter was used to cannulate the celiac trunk. Celiac artery arteriography was performed. A Lantern microcatheter was advanced into the splenic artery and additional angiography was performed. Catheter was positioned in the midportion of the main splenic artery. Multiple detachable coils were placed within the splenic artery under fluoroscopic guidance. Combination of Ruby, Interlock andIDC coils were used and the coil sizes included 8 mm, 6 mm and 5 mm. Follow-up angiography was performed following placement of the coils. The microcatheter was removed and the 5 French catheter was removed. Arteriogram was performed through the right groin sheath. Right groin sheath was removed using an Angio-Seal closure  device. Right groin hemostasis at the end of the procedure. FINDINGS: Celiac trunk is patent. Common hepatic artery and splenic artery are widely patent. The splenic artery is very tortuous. Microcatheter was advanced into the midportion of the main splenic artery. Multiple coils are packed within the midportion of the splenic artery. At the end of the procedure, there was near stagnant flow within the proximal splenic artery. There was some residual filling of a branch supplying the superior aspect of the spleen. IMPRESSION: Successful coil embolization of the main splenic artery. Electronically Signed   By: Richarda Overlie M.D.   On: 12/06/2020 17:20   [place] EKG SITE RITE  Result Date: 12/05/2020 If Site Rite image not attached, placement could not be confirmed due to current cardiac rhythm.   Labs:  CBC: Recent Labs  12/05/20 1813 12/05/20 2331 12/06/20 0038 12/06/20 0408 12/06/20 0958 12/06/20 2251 12/07/20 0414  WBC 13.4*  --  14.3* 12.5*  --   --  11.4*  HGB 7.4*   < > 6.6* 6.8* 7.1* 6.5* 8.0*  HCT 22.7*   < > 20.8* 20.4* 21.9* 19.5* 24.9*  PLT 220  --  209 163  --   --  210   < > = values in this interval not displayed.    COAGS: Recent Labs    11/30/20 1134 12/01/20 0016 12/05/20 1219  INR 1.2 1.2 1.5*    BMP: Recent Labs    12/04/20 0559 12/05/20 0150 12/05/20 0416 12/06/20 0730 12/07/20 0414  NA 149* 151* 154* 151* 149*  K 3.6 3.3* 3.4* 3.2* 3.5  CL 117* 119*  --  119* 115*  CO2 26 23  --  23 23  GLUCOSE 181* 160*  --  181* 213*  BUN 38* 25*  --  35* 21*  CALCIUM 8.2* 8.3*  --  8.1* 8.7*  CREATININE 0.85 0.92  --  0.87 0.78  GFRNONAA >60 >60  --  >60 >60    LIVER FUNCTION TESTS: Recent Labs    12/03/20 0113 12/04/20 0559 12/05/20 0150 12/06/20 0730  BILITOT 1.8* 1.5* 1.6* 1.7*  AST 48* 35 37 43*  ALT 63* 45* 41 29  ALKPHOS 63 58 64 59  PROT 5.3* 4.9* 4.9* 5.1*  ALBUMIN 1.8* 1.5* 1.7* 2.4*    Assessment and Plan:  Splenic hemorrhage s/p splenic  artery embolization 12/06/20  Patient is afebrile, hemoglobin has increased to 8.0 and WBC has decreased slightly to 11.4. Right groin vascular site is clean, dry and without evidence of hematoma. Please notify IR if right groin site shows evidence of bleeding/hematoma.   Please contact IR with any questions or concerns.   Electronically Signed: Alwyn Ren, AGACNP-BC [phone] 12/07/2020, 10:37 AM   I spent a total of 15 Minutes at the the patient's bedside AND on the patient's hospital floor or unit, greater than 50% of which was counseling/coordinating care for splenic artery embolization

## 2020-12-07 NOTE — Progress Notes (Signed)
Nutrition Follow-up  DOCUMENTATION CODES:   Not applicable  INTERVENTION:   TPN dosing per Pharmacy. Recommend continuing TPN until patient is tolerating at least full liquids with good intake of meals.  Continue to replace potassium, phosphorus, and magnesium as needed.  Zinc repletion in TPN.  Begin repletion of vitamins A and C when taking POs.  Add Boost Breeze po TID, each supplement provides 250 kcal and 9 grams of protein  NUTRITION DIAGNOSIS:   Increased nutrient needs related to acute illness as evidenced by estimated needs.  Ongoing, being addressed via initiation of TPN  GOAL:   Patient will meet greater than or equal to 90% of their needs  Progressing with initiation of TPN  MONITOR:   Diet advancement, Labs, Weight trends, I & O's, Other (TPN)  REASON FOR ASSESSMENT:   Ventilator, Consult Enteral/tube feeding initiation and management  ASSESSMENT:   Patient with PMH significant for HTN, CHF, s/p Roux en Y, and ETOH use. Presents this admission with acute pancreatitis and ETOH withdrawal.  8/15 - admit, NPO 8/16 - clear liquid diet initiated at 1529 8/17 - regular diet initiated at 0839 (PO 25-50% x 2 meals) 8/18 - found unresponsive on floor, intubated, trickle TF initiated 8/19 - TF advanced to goal 8/20 - TF d/c for ileus vs SBO, OG tube to suction 8/21 - CT abdomen/pelvis showing distal SBO 8/23 - extubated, OG tube removed 8/24 - TPN initiated 8/26 - starting clear liquid diet  Vitamin/Mineral Profile:  Vitamin A: 11.3 (low) Vitamin D: 33.01 (WNL) Vitamin C: 0.2 (low) Copper: 71 (low end of normal) Zinc: 42 (low) Vitamin B12: 3197 (high)  TPN currently infusing at 60 ml/h to provide 1440 ml, 84 gm protein, and 1505 kcal.  Receiving folic acid, MVI, zinc, thiamine in TPN.   Patient remains confused/disoriented.  He had a BM this morning. Plans to begin clear liquid diet today.   S/P splenic artery embolization 8/25. Received blood  transfusion 8/25 for Hgb 6.5. Hgb up to 8 today. Elevated TG level 391 8/25 suspected to be r/t blood drawn near TPN. New level 102 WNL today.  Admit weight: 112 kg Current weight: 122.6 kg (8/24)  Will utilize admit weight of 112 kg as EDW d/t ongoing edema and positive fluid status.  Medications reviewed and include Novolog SSI q 4 hours, IV protonix, IV abx, IV KCl, IV potassium phosphate.  Labs reviewed. sodium 149  K, Phos, and Mag WNL today, receiving 4 runs of K and one dose of K Phos this morning. CBG: [phone]  UOP: 900 ml x 24 hours I/O's: +13.9 L since admit  Diet Order:   Diet Order             Diet clear liquid Room service appropriate? Yes; Fluid consistency: Thin  Diet effective now                   EDUCATION NEEDS:   Not appropriate for education at this time  Skin:  Skin Assessment: Reviewed RN Assessment  Last BM:  8/26 type 7, medium, brown  Height:   Ht Readings from Last 1 Encounters:  11/26/20 5\' 10"  (1.778 m)    Weight:   Wt Readings from Last 1 Encounters:  12/05/20 122.6 kg    BMI:  Body mass index is 38.78 kg/m.  Estimated Nutritional Needs:   Kcal:  2500-2700  Protein:  130-150 grams  Fluid:  >/= 2.0 L   12/07/20, RD, LDN, CNSC Please refer  to Amion for contact information.

## 2020-12-07 NOTE — Progress Notes (Signed)
NAME:  Ernest Rodgers, MRN:  [id number], DOB:  Feb 17, 1971, LOS: 11 ADMISSION DATE:  11/26/2020, CONSULTATION DATE:  11/29/20 REFERRING MD:  Rachael Darby - FPTS, CHIEF COMPLAINT:  Unresponsive and Hypoxic    History of Present Illness:  50 yo male former smoker presented with epigastric pain.  He fell prior to admission and found to have Lt 7th rib fracture.  Found to have acute pancreatitis.  Started on CIWA for ETOH withdrawal.  Developed altered mental status with hypoxia on 8/18 and transferred to ICU.  He works as a travel Engineer, civil (consulting). Extubated 8/23, severe hallucinations and agitation in the evening  Pertinent  Medical History  ETOH, s/p Roux en Y, Diastolic CHF, HTN, Depression, s/p cholecystectomy  Significant Hospital Events: Including procedures, antibiotic start and stop dates in addition to other pertinent events   8/15 admitted to [facility] for acute pancreatitis  8/16 EtOH withdrawal sx  8/17 incr CIWA scores  8/18 found to be unresponsive on floor and hypoxic. moving to ICU, intubated  8/20 ileus versus SBO,  8/21 General Surgery consulted for SBO 8/23 extubated CT abdomen reveals peri-splenic hematoma    Antibiotics Unasyn 8/19-8/21 Vancomycin 8/20 Ceftriaxone 8/22>> Linezolid 8/21>>  Interim History / Subjective:  More alert and interactive today Still has episodes of confusion  Objective   Blood pressure (!) 171/103, pulse 92, temperature (!) 100.4 F (38 C), temperature source Rectal, resp. rate (!) 27, height 5\' 10"  (1.778 m), weight 122.6 kg, SpO2 98 %.        Intake/Output Summary (Last 24 hours) at 12/07/2020 0801 Last data filed at 12/06/2020 1802 Gross per 24 hour  Intake 1380.05 ml  Output 900 ml  Net 480.05 ml   Filed Weights   12/03/20 0500 12/04/20 0333 12/05/20 0630  Weight: 121.9 kg 126.7 kg 122.6 kg   .smcr  Resolved Hospital Problem list     Assessment & Plan:   Perisplenic hematoma Anemia Recent Labs    12/06/20 2251 12/07/20 0414  HGB 6.5*  8.0*   Status post transfusion 12/06/2020 with increase of hemoglobin Successful coil embolization of the main splenic artery.  On 12/06/2020 Continue to monitor H&H   Acute hypoxemic respiratory failure secondary to aspiration pneumonitis in the setting of acute pancreatitis  Extubate at 12/04/2020 not respiratory distress at this time. Oxygen as needed     Agitation/hallucination/anxiety Intermittently confused and disorientated Levan days out CIWA protocol but will continue at this time If he does not improve may consider CT of the head    Alcoholic pancreatitis Small bowel obstruction versus ileus Continue TNA   Metabolic encephalopathy Continue to monitor   History of hypertension, chronic diastolic congestive heart failure Monitor replete as needed  Atelectasis MRSA pneumonia  on linezolid ceftriaxone Monitor stop date in place for Zyvox   Acute kidney injury in setting of critical illness Lab Results  Component Value Date   CREATININE 0.78 12/07/2020   CREATININE 0.87 12/06/2020   CREATININE 0.92 12/05/2020   Recent Labs  Lab 12/05/20 0416 12/06/20 0730 12/07/20 0414  K 3.4* 3.2* 3.5    Monitor replete as needed  Acute nondisplaced left rib fractures As needed narcotics  Hyperglycemia CBG (last 3)  Recent Labs    12/06/20 2022 12/06/20 2332 12/07/20 0315  GLUCAP 199* 194* 212*    ssi  Hypernatremia Recent Labs  Lab 12/05/20 0416 12/06/20 0730 12/07/20 0414  NA 154* 151* 149*   Continue to monitor Careful with D5W as glucose rises    Best  Practice (right click and "Reselect all SmartList Selections" daily)   Diet/type: NPO DVT prophylaxis: LMWH GI prophylaxis: PPI Code Status:  full code Last date of multidisciplinary goals of care discussion: updated family at bedside on 8/26  Labs    CMP Latest Ref Rng & Units 12/07/2020 12/06/2020 12/05/2020  Glucose 70 - 99 mg/dL 720(N) 470(J) -  BUN 6 - 20 mg/dL 62(E) 36(O) -   Creatinine 0.61 - 1.24 mg/dL 2.94 7.65 -  Sodium 465 - 145 mmol/L 149(H) 151(H) 154(H)  Potassium 3.5 - 5.1 mmol/L 3.5 3.2(L) 3.4(L)  Chloride 98 - 111 mmol/L 115(H) 119(H) -  CO2 22 - 32 mmol/L 23 23 -  Calcium 8.9 - 10.3 mg/dL 0.3(T) 8.1(L) -  Total Protein 6.5 - 8.1 g/dL - 5.1(L) -  Total Bilirubin 0.3 - 1.2 mg/dL - 1.7(H) -  Alkaline Phos 38 - 126 U/L - 59 -  AST 15 - 41 U/L - 43(H) -  ALT 0 - 44 U/L - 29 -    CBC Latest Ref Rng & Units 12/07/2020 12/06/2020 12/06/2020  WBC 4.0 - 10.5 K/uL 11.4(H) - -  Hemoglobin 13.0 - 17.0 g/dL 8.0(L) 6.5(LL) 7.1(L)  Hematocrit 39.0 - 52.0 % 24.9(L) 19.5(L) 21.9(L)  Platelets 150 - 400 K/uL 210 - -    ABG    Component Value Date/Time   PHART 7.579 (H) 12/05/2020 0416   PCO2ART 21.3 (L) 12/05/2020 0416   PO2ART 80 (L) 12/05/2020 0416   HCO3 19.7 (L) 12/05/2020 0416   TCO2 20 (L) 12/05/2020 0416   ACIDBASEDEF 1.0 12/05/2020 0416   O2SAT 97.0 12/05/2020 0416    CBG (last 3)  Recent Labs    12/06/20 2022 12/06/20 2332 12/07/20 0315  GLUCAP 199* 194* 212*  App cct 34 min   Brett Canales Juandedios Dudash ACNP Acute Care Nurse Practitioner [facility] Please consult Amion 12/07/2020, 8:02 AM

## 2020-12-07 NOTE — Progress Notes (Signed)
PHARMACY - TOTAL PARENTERAL NUTRITION CONSULT NOTE   Indication: Prolonged ileus   Patient Measurements: Height: 5\' 10"  (177.8 cm) Weight: 122.6 kg (270 lb 4.5 oz) IBW/kg (Calculated) : 73 TPN AdjBW (KG): 82.8 Body mass index is 38.78 kg/m. Usual Weight: 118kg   Assessment: Patient presented to [facility] ED 8/15 with epigastric pain, found to have acute pancreatitis secondary to alcohol use. On 8/18, he developed altered mental status and hypoxia, for which he was transferred to the ICU. He was started on tube feeds 8/18-8/19. On 8/20 there was concern for ileus vs. SBO due to abdominal distension and general surgery was consulted. CT scan at that time showed distal SBO. CT 8/24 shows contrast through the distal small bowel and colon, more concerning for ileus. CT also shows progression of pancreatitis with increasing inflammatory changes, as well as a new large subscapular hematoma. Notably, patient has also received a Roux-en-Y Gastric Bypass Surgery in 2017, and has multiple nutrient deficiencies, including Vitamin A (11.3), Vitamin C (0.2), Zinc (42). Pharmacy has been consulted for TPN dosing to meet nutritional needs.   Below is the timeline for feeding as denoted by 2018, RD:   8/16 - clear liquid diet initiated at 1529  8/17 - regular diet initiated at 0839 (PO 25-50% x 2 meals)  8/18 - found unresponsive on floor, intubated, trickle TF initiated  8/19 - TF advanced to goal  8/20 - TF d/c for ileus vs SBO, OG tube to suction  8/21 - CT abdomen/pelvis showing distal SBO  8/23 - extubated, OG tube removed  Glucose / Insulin: Current CBGs ranging from 181-213, most 190s (received 18U aspart since 1800 8/25)- s/p 500 mL D5W bolus 8/25   Electrolytes:  - Mg 2.1 - K 3.5- (s/p 10 mEq x6 8/25- goal >/=4) - Na 149 (s/p D5W 9/25 bolus 8/25; no Na in TPN)  - Phosphorous 2.7 (decreased from 3)  Renal: Scr 0.78 (improved), UOP 0.3 mL/kg/h  Hepatic: AST/ALT 43/29, Tbili 1.7, TG 102 (drawn  appropriately) hepatic steatosis noted on CT 8/24.   Intake / Output; MIVF: 8/25 received D5W 500 mL bolus,+13.8L GI Imaging: 8/24 CT shows hepatatic steatosis, oral contrast through small bowel and colon indicative of ileus, and progressive pancreatitis with increasing inflammation.  GI Surgeries / Procedures: Roux-en-Y gastric Bypass 2017. IR embolization for splenic hematoma 8/25.  Central access: None  TPN start date: 12/05/20  Nutritional Goals: Goal TPN rate is 105 mL/hr (provides 146 g of protein and ~2,630 kcals per day) TPN rate of 60 mL/h will provide ~83 g protein and 1,504 Kcals/d.   RD Assessment: Estimated Needs Total Energy Estimated Needs: 2500-2700 Total Protein Estimated Needs: 130-150 grams Total Fluid Estimated Needs: >/= 2.0 L  Current Nutrition:  TPN Starting clear liquid diet 8/26. Passed swallow study.   Plan:  Continue TPN to 60 mL/hr at 1800- Notably patient at risk for refeeding syndrome due to alcohol use and malnutrition at baseline.  Electrolytes in TPN: continue Na 0 mEq/L, increase K to 14mEq/L, decrease Ca to 35mEq/L, continue Mg 71mEq/L, and Phos 17 mmol/L . Maximized acetate.  Supplement Mg 1g IV x1.  Supplement K 40 mEq. Supplement phos with Kphos 4m IV x1  Add standard MVI and trace elements to TPN. Added Zinc, folic acid, thiamine.  Continue Resistant q4h SSI and adjust as needed Increase insulin regular to 20U.  Continue D5W infusion per MD.  Monitor TPN labs on Mon/Thurs, Check BMET, Mg, Phos in AM. Will replete aggressively  with signs of refeeding and following surgery recommendations for electrolyte replacement.  Discussed with RD, and will supplement Vitamins A and C. RD recommended to recheck levels in 2 weeks.  - Vitamin A 20,000 units daily for 14 days - Vitamin C 1,000 mg daily for one month  Jani Gravel, PharmD PGY-1 Acute Care Resident  12/07/2020 11:52 AM

## 2020-12-07 NOTE — Progress Notes (Signed)
Patient had serosanguineous and sanguineous oozing from femoral site. Richarda Overlie MD from radiology was contacted and expressed that unless hematoma was present no further action was required. Will continue to monitor.

## 2020-12-08 ENCOUNTER — Inpatient Hospital Stay (HOSPITAL_COMMUNITY): Payer: Managed Care, Other (non HMO)

## 2020-12-08 DIAGNOSIS — K8521 Alcohol induced acute pancreatitis with uninfected necrosis: Secondary | ICD-10-CM | POA: Diagnosis not present

## 2020-12-08 DIAGNOSIS — N179 Acute kidney failure, unspecified: Secondary | ICD-10-CM | POA: Diagnosis not present

## 2020-12-08 LAB — TYPE AND SCREEN
ABO/RH(D): O NEG
Antibody Screen: NEGATIVE
Unit division: 0
Unit division: 0

## 2020-12-08 LAB — CBC WITH DIFFERENTIAL/PLATELET
Abs Immature Granulocytes: 0.23 10*3/uL — ABNORMAL HIGH (ref 0.00–0.07)
Basophils Absolute: 0 10*3/uL (ref 0.0–0.1)
Basophils Relative: 0 %
Eosinophils Absolute: 0.1 10*3/uL (ref 0.0–0.5)
Eosinophils Relative: 1 %
HCT: 22.5 % — ABNORMAL LOW (ref 39.0–52.0)
Hemoglobin: 7.4 g/dL — ABNORMAL LOW (ref 13.0–17.0)
Immature Granulocytes: 2 %
Lymphocytes Relative: 11 %
Lymphs Abs: 1.1 10*3/uL (ref 0.7–4.0)
MCH: 34.6 pg — ABNORMAL HIGH (ref 26.0–34.0)
MCHC: 32.9 g/dL (ref 30.0–36.0)
MCV: 105.1 fL — ABNORMAL HIGH (ref 80.0–100.0)
Monocytes Absolute: 0.9 10*3/uL (ref 0.1–1.0)
Monocytes Relative: 9 %
Neutro Abs: 7.9 10*3/uL — ABNORMAL HIGH (ref 1.7–7.7)
Neutrophils Relative %: 77 %
Platelets: 217 10*3/uL (ref 150–400)
RBC: 2.14 MIL/uL — ABNORMAL LOW (ref 4.22–5.81)
RDW: 22 % — ABNORMAL HIGH (ref 11.5–15.5)
WBC: 10.2 10*3/uL (ref 4.0–10.5)
nRBC: 0 % (ref 0.0–0.2)

## 2020-12-08 LAB — BPAM RBC
Blood Product Expiration Date: 202209082359
Blood Product Expiration Date: 202209082359
ISSUE DATE / TIME: 202208250250
ISSUE DATE / TIME: 202208260052
Unit Type and Rh: 9500
Unit Type and Rh: 9500

## 2020-12-08 LAB — BASIC METABOLIC PANEL
Anion gap: 7 (ref 5–15)
BUN: 13 mg/dL (ref 6–20)
CO2: 23 mmol/L (ref 22–32)
Calcium: 8.1 mg/dL — ABNORMAL LOW (ref 8.9–10.3)
Chloride: 113 mmol/L — ABNORMAL HIGH (ref 98–111)
Creatinine, Ser: 0.62 mg/dL (ref 0.61–1.24)
GFR, Estimated: >60 mL/min (ref >60)
Glucose, Bld: 127 mg/dL — ABNORMAL HIGH (ref 70–99)
Potassium: 3.6 mmol/L (ref 3.5–5.1)
Sodium: 143 mmol/L (ref 135–145)

## 2020-12-08 LAB — MAGNESIUM: Magnesium: 2 mg/dL (ref 1.7–2.4)

## 2020-12-08 LAB — GLUCOSE, CAPILLARY
Glucose-Capillary: 127 mg/dL — ABNORMAL HIGH (ref 70–99)
Glucose-Capillary: 139 mg/dL — ABNORMAL HIGH (ref 70–99)
Glucose-Capillary: 145 mg/dL — ABNORMAL HIGH (ref 70–99)
Glucose-Capillary: 142 mg/dL — ABNORMAL HIGH (ref 70–99)
Glucose-Capillary: 144 mg/dL — ABNORMAL HIGH (ref 70–99)
Glucose-Capillary: 121 mg/dL — ABNORMAL HIGH (ref 70–99)

## 2020-12-08 LAB — PHOSPHORUS: Phosphorus: 3.5 mg/dL (ref 2.5–4.6)

## 2020-12-08 MED ORDER — POTASSIUM CHLORIDE 10 MEQ/50ML IV SOLN
10.0000 meq | INTRAVENOUS | Status: AC
Start: 2020-12-08 — End: 2020-12-08
  Administered 2020-12-08 (×4): 10 meq via INTRAVENOUS
  Filled 2020-12-08 (×4): qty 50

## 2020-12-08 MED ORDER — ZINC CHLORIDE 1 MG/ML IV SOLN: INTRAVENOUS | Status: DC

## 2020-12-08 MED ORDER — INSULIN ASPART 100 UNIT/ML IJ SOLN
0.0000 [IU] | INTRAMUSCULAR | Status: DC
  Administered 2020-12-08 – 2020-12-09 (×6): 2 [IU] via SUBCUTANEOUS

## 2020-12-08 MED ORDER — ZINC CHLORIDE 1 MG/ML IV SOLN
INTRAVENOUS | Status: DC
  Filled 2020-12-08: qty 1252.8

## 2020-12-08 MED ORDER — PANTOPRAZOLE SODIUM 40 MG PO TBEC
40.0000 mg | DELAYED_RELEASE_TABLET | Freq: Every day | ORAL | Status: DC
  Administered 2020-12-09 – 2020-12-13 (×5): 40 mg via ORAL
  Filled 2020-12-08 (×5): qty 1

## 2020-12-08 MED ORDER — ZINC CHLORIDE 1 MG/ML IV SOLN
INTRAVENOUS | Status: AC
  Filled 2020-12-08: qty 1252.8

## 2020-12-08 MED ORDER — METOPROLOL TARTRATE 25 MG PO TABS
25.0000 mg | ORAL_TABLET | Freq: Two times a day (BID) | ORAL | Status: DC
  Administered 2020-12-08 – 2020-12-09 (×3): 25 mg via ORAL
  Filled 2020-12-08 (×4): qty 1

## 2020-12-08 NOTE — Progress Notes (Signed)
PROGRESS NOTE FOR [name]  Subjective: Feeling better.  Objective: Vital signs in last 24 hours: Temp:  [98 F (36.7 C)-100 F (37.8 C)] 98.3 F (36.8 C) (08/27 0700) Pulse Rate:  [84-113] 87 (08/27 0630) Resp:  [16-34] 26 (08/27 0630) BP: (119-179)/(67-136) 156/87 (08/27 0630) SpO2:  [89 %-100 %] 96 % (08/27 0630) Last BM Date: 12/07/20  Intake/Output from previous day: 08/26 0701 - 08/27 0700 In: 4074.4 [P.O.:340; I.V.:2114.4; IV Piggyback:1620.1] Out: 1902.4 [Urine:1902.4] Intake/Output this shift: No intake/output data recorded.  General appearance: alert, no distress, and appears to be better oriented to person, place, and time. Resp: clear to auscultation bilaterally and in the anterior an apical portions of his chest.  It was difficult to auscultate using the disposable stethescope Cardio: regular rate and rhythm GI: soft, nontender, distended Extremities: extremities normal, atraumatic, no cyanosis or edema  Lab Results: Recent Labs    12/07/20 0414 12/07/20 1345 12/08/20 0334  WBC 11.4* 10.7* 10.2  HGB 8.0* 7.9* 7.4*  HCT 24.9* 24.2* 22.5*  PLT 210 229 217   BMET Recent Labs    12/06/20 0730 12/07/20 0414 12/08/20 0334  NA 151* 149* 143  K 3.2* 3.5 3.6  CL 119* 115* 113*  CO2 _0 GLUCOSE 181* 213* 127*  BUN 35* 21* 13  CREATININE 0.87 0.78 0.62  CALCIUM 8.1* 8.7* 8.1*   LFT Recent Labs    12/06/20 0730  PROT 5.1*  ALBUMIN 2.4*  AST 43*  ALT 29  ALKPHOS 59  BILITOT 1.7*   PT/INR Recent Labs    12/05/20 1219  LABPROT 17.7*  INR 1.5*   Hepatitis Panel No results for input(s): HEPBSAG, HCVAB, HEPAIGM, HEPBIGM in the last 72 hours. C-Diff No results for input(s): CDIFFTOX in the last 72 hours. Fecal Lactopherrin No results for input(s): FECLLACTOFRN in the last 72 hours.  Studies/Results: IR Angiogram Visceral Selective  Result Date: 12/06/2020 INDICATION: 50 year old with a large subcapsular splenic hematoma and  decreasing hemoglobin. Plan for arteriography and splenic artery embolization. EXAM: 1. Visceral angiography with selective injection of the celiac artery trunk and splenic artery 2. Coil embolization of the splenic artery 3. Ultrasound guidance for vascular access MEDICATIONS: Moderate sedation ANESTHESIA/SEDATION: Moderate (conscious) sedation was employed during this procedure. A total of Versed 6.0 mg and Fentanyl 200 mcg was administered intravenously. Moderate Sedation Time: 96 minutes. The patient's level of consciousness and vital signs were monitored continuously by radiology nursing throughout the procedure under my direct supervision. CONTRAST:  60 mL Omnipaque 350 FLUOROSCOPY TIME:  Fluoroscopy Time: 22 minutes, 24 seconds, 3,664 mGy COMPLICATIONS: None immediate. PROCEDURE: Informed consent was obtained from the patient following explanation of the procedure, risks, benefits and alternatives. The patient understands, agrees and consents for the procedure. All questions were addressed. A time out was performed prior to the initiation of the procedure. Patient was placed supine. Ultrasound confirmed a patent right common femoral artery. Ultrasound image was saved for documentation. Right groin was prepped and draped in sterile fashion. Maximal barrier sterile technique was utilized including caps, mask, sterile gowns, sterile gloves, sterile drape, hand hygiene and skin antiseptic. Right groin was anesthetized with 1% lidocaine. Small incision was made. Using ultrasound guidance, 21 gauge needle was directed into the right common femoral artery and micropuncture dilator set was placed. Five French vascular sheath was placed. Bentson wire was advanced into the abdominal aorta. C2 catheter was used to cannulate the celiac trunk. Celiac artery arteriography was performed. A Lantern microcatheter was  advanced into the splenic artery and additional angiography was performed. Catheter was positioned in the  midportion of the main splenic artery. Multiple detachable coils were placed within the splenic artery under fluoroscopic guidance. Combination of Ruby, Interlock andIDC coils were used and the coil sizes included 8 mm, 6 mm and 5 mm. Follow-up angiography was performed following placement of the coils. The microcatheter was removed and the 5 French catheter was removed. Arteriogram was performed through the right groin sheath. Right groin sheath was removed using an Angio-Seal closure device. Right groin hemostasis at the end of the procedure. FINDINGS: Celiac trunk is patent. Common hepatic artery and splenic artery are widely patent. The splenic artery is very tortuous. Microcatheter was advanced into the midportion of the main splenic artery. Multiple coils are packed within the midportion of the splenic artery. At the end of the procedure, there was near stagnant flow within the proximal splenic artery. There was some residual filling of a branch supplying the superior aspect of the spleen. IMPRESSION: Successful coil embolization of the main splenic artery. Electronically Signed   By: Markus Daft M.Rodgers.   On: 12/06/2020 17:20   IR US Guide Vasc Access Right  Result Date: 12/06/2020 INDICATION: 50 year old with a large subcapsular splenic hematoma and decreasing hemoglobin. Plan for arteriography and splenic artery embolization. EXAM: 1. Visceral angiography with selective injection of the celiac artery trunk and splenic artery 2. Coil embolization of the splenic artery 3. Ultrasound guidance for vascular access MEDICATIONS: Moderate sedation ANESTHESIA/SEDATION: Moderate (conscious) sedation was employed during this procedure. A total of Versed 6.0 mg and Fentanyl 200 mcg was administered intravenously. Moderate Sedation Time: 96 minutes. The patient's level of consciousness and vital signs were monitored continuously by radiology nursing throughout the procedure under my direct supervision. CONTRAST:  60 mL  Omnipaque 350 FLUOROSCOPY TIME:  Fluoroscopy Time: 22 minutes, 24 seconds, 9,622 mGy COMPLICATIONS: None immediate. PROCEDURE: Informed consent was obtained from the patient following explanation of the procedure, risks, benefits and alternatives. The patient understands, agrees and consents for the procedure. All questions were addressed. A time out was performed prior to the initiation of the procedure. Patient was placed supine. Ultrasound confirmed a patent right common femoral artery. Ultrasound image was saved for documentation. Right groin was prepped and draped in sterile fashion. Maximal barrier sterile technique was utilized including caps, mask, sterile gowns, sterile gloves, sterile drape, hand hygiene and skin antiseptic. Right groin was anesthetized with 1% lidocaine. Small incision was made. Using ultrasound guidance, 21 gauge needle was directed into the right common femoral artery and micropuncture dilator set was placed. Five French vascular sheath was placed. Bentson wire was advanced into the abdominal aorta. C2 catheter was used to cannulate the celiac trunk. Celiac artery arteriography was performed. A Lantern microcatheter was advanced into the splenic artery and additional angiography was performed. Catheter was positioned in the midportion of the main splenic artery. Multiple detachable coils were placed within the splenic artery under fluoroscopic guidance. Combination of Ruby, Interlock andIDC coils were used and the coil sizes included 8 mm, 6 mm and 5 mm. Follow-up angiography was performed following placement of the coils. The microcatheter was removed and the 5 French catheter was removed. Arteriogram was performed through the right groin sheath. Right groin sheath was removed using an Angio-Seal closure device. Right groin hemostasis at the end of the procedure. FINDINGS: Celiac trunk is patent. Common hepatic artery and splenic artery are widely patent. The splenic artery is very  tortuous.  Microcatheter was advanced into the midportion of the main splenic artery. Multiple coils are packed within the midportion of the splenic artery. At the end of the procedure, there was near stagnant flow within the proximal splenic artery. There was some residual filling of a branch supplying the superior aspect of the spleen. IMPRESSION: Successful coil embolization of the main splenic artery. Electronically Signed   By: Markus Daft M.Rodgers.   On: 12/06/2020 17:20   IR EMBO ART  VEN HEMORR LYMPH EXTRAV  INC GUIDE ROADMAPPING  Result Date: 12/06/2020 INDICATION: 50 year old with a large subcapsular splenic hematoma and decreasing hemoglobin. Plan for arteriography and splenic artery embolization. EXAM: 1. Visceral angiography with selective injection of the celiac artery trunk and splenic artery 2. Coil embolization of the splenic artery 3. Ultrasound guidance for vascular access MEDICATIONS: Moderate sedation ANESTHESIA/SEDATION: Moderate (conscious) sedation was employed during this procedure. A total of Versed 6.0 mg and Fentanyl 200 mcg was administered intravenously. Moderate Sedation Time: 96 minutes. The patient's level of consciousness and vital signs were monitored continuously by radiology nursing throughout the procedure under my direct supervision. CONTRAST:  60 mL Omnipaque 350 FLUOROSCOPY TIME:  Fluoroscopy Time: 22 minutes, 24 seconds, 1,696 mGy COMPLICATIONS: None immediate. PROCEDURE: Informed consent was obtained from the patient following explanation of the procedure, risks, benefits and alternatives. The patient understands, agrees and consents for the procedure. All questions were addressed. A time out was performed prior to the initiation of the procedure. Patient was placed supine. Ultrasound confirmed a patent right common femoral artery. Ultrasound image was saved for documentation. Right groin was prepped and draped in sterile fashion. Maximal barrier sterile technique was utilized  including caps, mask, sterile gowns, sterile gloves, sterile drape, hand hygiene and skin antiseptic. Right groin was anesthetized with 1% lidocaine. Small incision was made. Using ultrasound guidance, 21 gauge needle was directed into the right common femoral artery and micropuncture dilator set was placed. Five French vascular sheath was placed. Bentson wire was advanced into the abdominal aorta. C2 catheter was used to cannulate the celiac trunk. Celiac artery arteriography was performed. A Lantern microcatheter was advanced into the splenic artery and additional angiography was performed. Catheter was positioned in the midportion of the main splenic artery. Multiple detachable coils were placed within the splenic artery under fluoroscopic guidance. Combination of Ruby, Interlock andIDC coils were used and the coil sizes included 8 mm, 6 mm and 5 mm. Follow-up angiography was performed following placement of the coils. The microcatheter was removed and the 5 French catheter was removed. Arteriogram was performed through the right groin sheath. Right groin sheath was removed using an Angio-Seal closure device. Right groin hemostasis at the end of the procedure. FINDINGS: Celiac trunk is patent. Common hepatic artery and splenic artery are widely patent. The splenic artery is very tortuous. Microcatheter was advanced into the midportion of the main splenic artery. Multiple coils are packed within the midportion of the splenic artery. At the end of the procedure, there was near stagnant flow within the proximal splenic artery. There was some residual filling of a branch supplying the superior aspect of the spleen. IMPRESSION: Successful coil embolization of the main splenic artery. Electronically Signed   By: Markus Daft M.Rodgers.   On: 12/06/2020 17:20    Medications: Scheduled:  sodium chloride   Intravenous Once   vitamin C  1,000 mg Oral Daily   chlorhexidine  15 mL Mouth Rinse BID   chlorhexidine gluconate  (MEDLINE KIT)  15 mL  Mouth Rinse BID   Chlorhexidine Gluconate Cloth  6 each Topical Daily   feeding supplement  1 Container Oral TID BM   insulin aspart  0-15 Units Subcutaneous Q4H   mouth rinse  15 mL Mouth Rinse q12n4p   metoprolol tartrate  5 mg Intravenous Q6H   pantoprazole (PROTONIX) IV  40 mg Intravenous Q24H   sodium chloride flush  10-40 mL Intracatheter Q12H   vitamin A  20,000 Units Oral Daily   Continuous:  sodium chloride 10 mL/hr at 12/08/20 0600   cefTRIAXone (ROCEPHIN)  IV Stopped (12/07/20 1508)   potassium chloride     TPN ADULT (ION)      Assessment/Plan: 1) ETOH pancreatitis. 2) Anemia. 3) Confusion - improving.   The patient does not exhibit any fever over the past 12 hours.  Clinically it appears that he is improving.  The recent CT scan was negative for any evidence of a pancreatic infection.  BUN/Cr and HCT normal.  Plan: 1) Continue with supportive care. 2) Possible advancement of diet tomorrow.  Remain on cleaers. 3) Continue with antibiotics.  LOS: 12 days   Ernest Rodgers 12/08/2020, 7:41 AM

## 2020-12-08 NOTE — Progress Notes (Signed)
NAME:  Ernest Rodgers, MRN:  [id number], DOB:  [birth year], LOS: 12 ADMISSION DATE:  11/26/2020, CONSULTATION DATE:  11/29/20 REFERRING MD:  Rachael Darby - FPTS, CHIEF COMPLAINT:  Unresponsive and Hypoxic    History of Present Illness:  50 yo male former smoker presented with epigastric pain.  He fell prior to admission and found to have Lt 7th rib fracture.  Found to have acute pancreatitis.  Started on CIWA for ETOH withdrawal.  Developed altered mental status with hypoxia on 8/18 and transferred to ICU.  He works as a travel Engineer, civil (consulting). Extubated 8/23, severe hallucinations and agitation in the evening  Pertinent  Medical History  ETOH, s/p Roux en Y, Diastolic CHF, HTN, Depression, s/p cholecystectomy  Significant Hospital Events: Including procedures, antibiotic start and stop dates in addition to other pertinent events   8/15 admitted to [facility] for acute pancreatitis  8/16 EtOH withdrawal sx  8/17 incr CIWA scores  8/18 found to be unresponsive on floor and hypoxic. moving to ICU, intubated  8/20 ileus versus SBO,  8/21 General Surgery consulted for SBO 8/23 extubated 8/24 CT abdomen reveals peri-splenic hematoma 8/25 Splenic embolization by IR  Antibiotics Unasyn 8/19-8/21 Vancomycin 8/20 Linezolid 8/21>> 8/26 Ceftriaxone 8/22>>  Interim History / Subjective:   More awake, Tolerating clear liq diet Hallucinations and confusion better Hb stable  Objective   Blood pressure (!) 156/87, pulse 87, temperature 98.3 F (36.8 C), temperature source Oral, resp. rate (!) 26, height 5\' 10"  (1.778 m), weight 124.3 kg, SpO2 96 %.        Intake/Output Summary (Last 24 hours) at 12/08/2020 1044 Last data filed at 12/08/2020 0600 Gross per 24 hour  Intake 4064.42 ml  Output 1627.3 ml  Net 2437.12 ml   Filed Weights   12/04/20 0333 12/05/20 0630 12/06/20 2300  Weight: 126.7 kg 122.6 kg 124.3 kg    Resolved Hospital Problem list   AKI Hypernatremia  Assessment & Plan:   Perisplenic  hematoma Anemia Status post transfusion 12/06/2020 with increase of hemoglobin Successful coil embolization of the main splenic artery.  On 12/06/2020 Continue to monitor H&H  Acute hypoxemic respiratory failure secondary to aspiration pneumonitis in the setting of acute pancreatitis Extubate at 12/04/2020 not respiratory distress at this time. Wean down O2  Agitation/hallucination/anxiety Metabolic encephalopathy Improving mental status CIWA protocol. Haldol PRN for agitation  Alcoholic pancreatitis Small bowel obstruction versus ileus Continue TNA Advance diet as toelrated  History of hypertension, chronic diastolic congestive heart failure Monitor replete as needed  MRSA pneumonia  Finished 7 days of Linezolid Ceftriaxone for 7 days till 8/28  Acute nondisplaced left rib fractures As needed narcotics  Hyperglycemia SSI coverage   Best Practice (right click and "Reselect all SmartList Selections" daily)   Diet/type: clear liquids and TPN DVT prophylaxis: SCD. LMWH held due to splenic bleeding GI prophylaxis: PPI Code Status:  full code Last date of multidisciplinary goals of care discussion: updated family at bedside on 8/26 Keep in ICU 24 more hrs. Likely tx out tomorrow  Signature:   The patient is critically ill with multiple organ system failure and requires high complexity decision making for assessment and support, frequent evaluation and titration of therapies, advanced monitoring, review of radiographic studies and interpretation of complex data.   Critical Care Time devoted to patient care services, exclusive of separately billable procedures, described in this note is 35 minutes.   9/26 MD [facility] See Amion for pager  If no response to pager ,  please call [phone] until 7pm After 7:00 pm call Elink  [phone] 12/08/2020, 10:53 AM

## 2020-12-08 NOTE — Progress Notes (Signed)
Subjective: CC: Doing well. Tolerating cld and finishing 50% of trays per RN. No abdominal pain, n/v. Passing flatus. 2 liquid bm's yesterday.   Objective: Vital signs in last 24 hours: Temp:  [98 F (36.7 C)-100 F (37.8 C)] 98.3 F (36.8 C) (08/27 0700) Pulse Rate:  [84-113] 87 (08/27 0630) Resp:  [18-34] 26 (08/27 0630) BP: (119-179)/(77-136) 156/87 (08/27 0630) SpO2:  [89 %-100 %] 96 % (08/27 0630) Last BM Date: 12/07/20  Intake/Output from previous day: 08/26 0701 - 08/27 0700 In: 4074.4 [P.O.:340; I.V.:2114.4; IV Piggyback:1620.1] Out: 1902.4 [Urine:1902.4] Intake/Output this shift: No intake/output data recorded.  PE: Gen:  Alert, NAD, pleasant Pulm: Normal rate and effort on OT Abd: Soft, improved distension of the upper abdomen, NT, no peritonitis, +BS  Lab Results:  Recent Labs    12/07/20 1345 12/08/20 0334  WBC 10.7* 10.2  HGB 7.9* 7.4*  HCT 24.2* 22.5*  PLT 229 217   BMET Recent Labs    12/07/20 0414 12/08/20 0334  NA 149* 143  K 3.5 3.6  CL 115* 113*  CO2 23 23  GLUCOSE 213* 127*  BUN 21* 13  CREATININE 0.78 0.62  CALCIUM 8.7* 8.1*   PT/INR Recent Labs    12/05/20 1219  LABPROT 17.7*  INR 1.5*   CMP     Component Value Date/Time   NA 143 12/08/2020 0334   K 3.6 12/08/2020 0334   CL 113 (H) 12/08/2020 0334   CO2 23 12/08/2020 0334   GLUCOSE 127 (H) 12/08/2020 0334   BUN 13 12/08/2020 0334   CREATININE 0.62 12/08/2020 0334   CALCIUM 8.1 (L) 12/08/2020 0334   PROT 5.1 (L) 12/06/2020 0730   ALBUMIN 2.4 (L) 12/06/2020 0730   AST 43 (H) 12/06/2020 0730   ALT 29 12/06/2020 0730   ALKPHOS 59 12/06/2020 0730   BILITOT 1.7 (H) 12/06/2020 0730   GFRNONAA >60 12/08/2020 0334   Lipase     Component Value Date/Time   LIPASE 507 (H) 11/27/2020 0208    Studies/Results: IR Angiogram Visceral Selective  Result Date: 12/06/2020 INDICATION: 50 year old with a large subcapsular splenic hematoma and decreasing hemoglobin. Plan  for arteriography and splenic artery embolization. EXAM: 1. Visceral angiography with selective injection of the celiac artery trunk and splenic artery 2. Coil embolization of the splenic artery 3. Ultrasound guidance for vascular access MEDICATIONS: Moderate sedation ANESTHESIA/SEDATION: Moderate (conscious) sedation was employed during this procedure. A total of Versed 6.0 mg and Fentanyl 200 mcg was administered intravenously. Moderate Sedation Time: 96 minutes. The patient's level of consciousness and vital signs were monitored continuously by radiology nursing throughout the procedure under my direct supervision. CONTRAST:  60 mL Omnipaque 350 FLUOROSCOPY TIME:  Fluoroscopy Time: 22 minutes, 24 seconds, 1,604 mGy COMPLICATIONS: None immediate. PROCEDURE: Informed consent was obtained from the patient following explanation of the procedure, risks, benefits and alternatives. The patient understands, agrees and consents for the procedure. All questions were addressed. A time out was performed prior to the initiation of the procedure. Patient was placed supine. Ultrasound confirmed a patent right common femoral artery. Ultrasound image was saved for documentation. Right groin was prepped and draped in sterile fashion. Maximal barrier sterile technique was utilized including caps, mask, sterile gowns, sterile gloves, sterile drape, hand hygiene and skin antiseptic. Right groin was anesthetized with 1% lidocaine. Small incision was made. Using ultrasound guidance, 21 gauge needle was directed into the right common femoral artery and micropuncture dilator set was placed. Five [place]  vascular sheath was placed. Bentson wire was advanced into the abdominal aorta. C2 catheter was used to cannulate the celiac trunk. Celiac artery arteriography was performed. A Lantern microcatheter was advanced into the splenic artery and additional angiography was performed. Catheter was positioned in the midportion of the main splenic  artery. Multiple detachable coils were placed within the splenic artery under fluoroscopic guidance. Combination of Ruby, Interlock andIDC coils were used and the coil sizes included 8 mm, 6 mm and 5 mm. Follow-up angiography was performed following placement of the coils. The microcatheter was removed and the 5 French catheter was removed. Arteriogram was performed through the right groin sheath. Right groin sheath was removed using an Angio-Seal closure device. Right groin hemostasis at the end of the procedure. FINDINGS: Celiac trunk is patent. Common hepatic artery and splenic artery are widely patent. The splenic artery is very tortuous. Microcatheter was advanced into the midportion of the main splenic artery. Multiple coils are packed within the midportion of the splenic artery. At the end of the procedure, there was near stagnant flow within the proximal splenic artery. There was some residual filling of a branch supplying the superior aspect of the spleen. IMPRESSION: Successful coil embolization of the main splenic artery. Electronically Signed   By: Richarda OverlieAdam  Henn M.D.   On: 12/06/2020 17:20   IR US Guide Vasc Access Right  Result Date: 12/06/2020 INDICATION: 50 year old with a large subcapsular splenic hematoma and decreasing hemoglobin. Plan for arteriography and splenic artery embolization. EXAM: 1. Visceral angiography with selective injection of the celiac artery trunk and splenic artery 2. Coil embolization of the splenic artery 3. Ultrasound guidance for vascular access MEDICATIONS: Moderate sedation ANESTHESIA/SEDATION: Moderate (conscious) sedation was employed during this procedure. A total of Versed 6.0 mg and Fentanyl 200 mcg was administered intravenously. Moderate Sedation Time: 96 minutes. The patient's level of consciousness and vital signs were monitored continuously by radiology nursing throughout the procedure under my direct supervision. CONTRAST:  60 mL Omnipaque 350 FLUOROSCOPY TIME:   Fluoroscopy Time: 22 minutes, 24 seconds, 1,604 mGy COMPLICATIONS: None immediate. PROCEDURE: Informed consent was obtained from the patient following explanation of the procedure, risks, benefits and alternatives. The patient understands, agrees and consents for the procedure. All questions were addressed. A time out was performed prior to the initiation of the procedure. Patient was placed supine. Ultrasound confirmed a patent right common femoral artery. Ultrasound image was saved for documentation. Right groin was prepped and draped in sterile fashion. Maximal barrier sterile technique was utilized including caps, mask, sterile gowns, sterile gloves, sterile drape, hand hygiene and skin antiseptic. Right groin was anesthetized with 1% lidocaine. Small incision was made. Using ultrasound guidance, 21 gauge needle was directed into the right common femoral artery and micropuncture dilator set was placed. Five French vascular sheath was placed. Bentson wire was advanced into the abdominal aorta. C2 catheter was used to cannulate the celiac trunk. Celiac artery arteriography was performed. A Lantern microcatheter was advanced into the splenic artery and additional angiography was performed. Catheter was positioned in the midportion of the main splenic artery. Multiple detachable coils were placed within the splenic artery under fluoroscopic guidance. Combination of Ruby, Interlock andIDC coils were used and the coil sizes included 8 mm, 6 mm and 5 mm. Follow-up angiography was performed following placement of the coils. The microcatheter was removed and the 5 French catheter was removed. Arteriogram was performed through the right groin sheath. Right groin sheath was removed using an Angio-Seal closure device.  Right groin hemostasis at the end of the procedure. FINDINGS: Celiac trunk is patent. Common hepatic artery and splenic artery are widely patent. The splenic artery is very tortuous. Microcatheter was  advanced into the midportion of the main splenic artery. Multiple coils are packed within the midportion of the splenic artery. At the end of the procedure, there was near stagnant flow within the proximal splenic artery. There was some residual filling of a branch supplying the superior aspect of the spleen. IMPRESSION: Successful coil embolization of the main splenic artery. Electronically Signed   By: Richarda Overlie M.D.   On: 12/06/2020 17:20   IR EMBO ART  VEN HEMORR LYMPH EXTRAV  INC GUIDE ROADMAPPING  Result Date: 12/06/2020 INDICATION: 50 year old with a large subcapsular splenic hematoma and decreasing hemoglobin. Plan for arteriography and splenic artery embolization. EXAM: 1. Visceral angiography with selective injection of the celiac artery trunk and splenic artery 2. Coil embolization of the splenic artery 3. Ultrasound guidance for vascular access MEDICATIONS: Moderate sedation ANESTHESIA/SEDATION: Moderate (conscious) sedation was employed during this procedure. A total of Versed 6.0 mg and Fentanyl 200 mcg was administered intravenously. Moderate Sedation Time: 96 minutes. The patient's level of consciousness and vital signs were monitored continuously by radiology nursing throughout the procedure under my direct supervision. CONTRAST:  60 mL Omnipaque 350 FLUOROSCOPY TIME:  Fluoroscopy Time: 22 minutes, 24 seconds, 1,604 mGy COMPLICATIONS: None immediate. PROCEDURE: Informed consent was obtained from the patient following explanation of the procedure, risks, benefits and alternatives. The patient understands, agrees and consents for the procedure. All questions were addressed. A time out was performed prior to the initiation of the procedure. Patient was placed supine. Ultrasound confirmed a patent right common femoral artery. Ultrasound image was saved for documentation. Right groin was prepped and draped in sterile fashion. Maximal barrier sterile technique was utilized including caps, mask,  sterile gowns, sterile gloves, sterile drape, hand hygiene and skin antiseptic. Right groin was anesthetized with 1% lidocaine. Small incision was made. Using ultrasound guidance, 21 gauge needle was directed into the right common femoral artery and micropuncture dilator set was placed. Five French vascular sheath was placed. Bentson wire was advanced into the abdominal aorta. C2 catheter was used to cannulate the celiac trunk. Celiac artery arteriography was performed. A Lantern microcatheter was advanced into the splenic artery and additional angiography was performed. Catheter was positioned in the midportion of the main splenic artery. Multiple detachable coils were placed within the splenic artery under fluoroscopic guidance. Combination of Ruby, Interlock andIDC coils were used and the coil sizes included 8 mm, 6 mm and 5 mm. Follow-up angiography was performed following placement of the coils. The microcatheter was removed and the 5 French catheter was removed. Arteriogram was performed through the right groin sheath. Right groin sheath was removed using an Angio-Seal closure device. Right groin hemostasis at the end of the procedure. FINDINGS: Celiac trunk is patent. Common hepatic artery and splenic artery are widely patent. The splenic artery is very tortuous. Microcatheter was advanced into the midportion of the main splenic artery. Multiple coils are packed within the midportion of the splenic artery. At the end of the procedure, there was near stagnant flow within the proximal splenic artery. There was some residual filling of a branch supplying the superior aspect of the spleen. IMPRESSION: Successful coil embolization of the main splenic artery. Electronically Signed   By: Richarda Overlie M.D.   On: 12/06/2020 17:20    Anti-infectives: Anti-infectives (From admission, onward)  Start     Dose/Rate Route Frequency Ordered Stop   12/03/20 1115  cefTRIAXone (ROCEPHIN) 2 g in sodium chloride 0.9 % 100  mL IVPB        2 g 200 mL/hr over 30 Minutes Intravenous Every 24 hours 12/03/20 1016 12/09/20 2359   12/02/20 1500  linezolid (ZYVOX) IVPB 600 mg        600 mg 300 mL/hr over 60 Minutes Intravenous Every 12 hours 12/02/20 1407 12/07/20 2315   12/02/20 1028  vancomycin variable dose per unstable renal function (pharmacist dosing)  Status:  Discontinued         Does not apply See admin instructions 12/02/20 1028 12/02/20 1407   12/02/20 0000  vancomycin (VANCOREADY) IVPB 1500 mg/300 mL  Status:  Discontinued        1,500 mg 150 mL/hr over 120 Minutes Intravenous Every 12 hours 12/01/20 1126 12/02/20 1028   12/01/20 1215  vancomycin (VANCOREADY) IVPB 2000 mg/400 mL        2,000 mg 200 mL/hr over 120 Minutes Intravenous  Once 12/01/20 1123 12/01/20 1433   11/30/20 1200  Ampicillin-Sulbactam (UNASYN) 3 g in sodium chloride 0.9 % 100 mL IVPB  Status:  Discontinued        3 g 200 mL/hr over 30 Minutes Intravenous Every 6 hours 11/30/20 1055 12/02/20 1522        Assessment/Plan 50 year old male with alcoholic pancreatitis SBO vs Ileus from above - 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 CT 8/24 w/ more of an ileus picture - oral contrast has passed into the distal small bowel and colon. Now having bowel function. Tol CLD. Adv to FLD - On TPN. Consider 1/2 TPN when tolerating FLD - Keep K > 4 and Mg > 2 for bowel function - Mobilize as able for bowel function - We will follow with you. Appreciate CCM's assistance.    Subcapsular hematoma of the spleen ABL anemia  - s/p IR splenic artery embolization 8/25 by IR - s/p 3U total on 8/25.  - hgb stable from 8.0 > 7.9 > 7.4. Continue to trend.  - Will need splenic vaccines prior to discharge    FEN - FLD. On TPN. 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 GI following.  Elevated LFT's -  Needs close monitoring, especially now that TNA has been initiated Aspiration pneumonitis with Staph aureus in sputum - comp abx  Alcohol withdrawal - on thiamine and folic acid. CIWA.  Acute metabolic encephalopathy HTN CHF L rib fx's     LOS: 12 days    Jacinto Halim , [facility] Surgery 12/08/2020, 8:15 AM Please see Amion for pager number during day hours 7:00am-4:30pm

## 2020-12-08 NOTE — Progress Notes (Addendum)
PHARMACY - TOTAL PARENTERAL NUTRITION CONSULT NOTE   Indication: Prolonged ileus   Patient Measurements: Height: 5\' 10"  (177.8 cm) Weight: 124.3 kg (274 lb 0.5 oz) IBW/kg (Calculated) : 73 TPN AdjBW (KG): 82.8 Body mass index is 39.32 kg/m. Usual Weight: 118kg   Assessment: Patient presented to [facility] ED 8/15 with epigastric pain, found to have acute pancreatitis secondary to alcohol use. On 8/18, he developed altered mental status and hypoxia, for which he was transferred to the ICU. He was started on tube feeds 8/18-8/19. On 8/20 there was concern for ileus vs. SBO due to abdominal distension and general surgery was consulted. CT scan at that time showed distal SBO. CT 8/24 shows contrast through the distal small bowel and colon, more concerning for ileus. CT also shows progression of pancreatitis with increasing inflammatory changes, as well as a new large subscapular hematoma. Notably, patient has also received a Roux-en-Y Gastric Bypass Surgery in 2017, and has multiple nutrient deficiencies, including Vitamin A (11.3), Vitamin C (0.2), Zinc (42). Pharmacy has been consulted for TPN dosing to meet nutritional needs. Note patient is at risk for refeeding syndrome due to alcohol use and malnutrition at baseline.   Below is the timeline for feeding as denoted by 2018, RD:   8/16 - clear liquid diet initiated at 1529  8/17 - regular diet initiated at 0839 (PO 25-50% x 2 meals)  8/18 - found unresponsive on floor, intubated, trickle TF initiated  8/19 - TF advanced to goal  8/20 - TF d/c for ileus vs SBO, OG tube to suction  8/21 - CT abdomen/pelvis showing distal SBO  8/23 - extubated, OG tube removed  Glucose / Insulin: BG 127- 218, A1C 5.5, received 25 units insulin/24 hr in SSI + 20 units in TPN - dextrose 9/23 bolus x1 and D5W 50 ml/hr ran 14:40 to 1am for hypernatremia  Electrolytes: K 3.6 (received 75 mEq IV), Cl 113, Phos 3.5 (Received 20 mmol) CoCa 9.38, others wnl  Renal:  Scr 0.6 , BUN wnl Hepatic: AST/ALT 43/29, Tbili 1.7, TG 102 (drawn appropriately) hepatic steatosis noted on CT 8/24.   Intake / Output; MIVF: UOP 0.6 ml/kg/hr, Net +15.9L, BM x2 GI Imaging: 8/20 Abd Xray- adynamic ileus  8/21 CT abd- distal small bowel obstruction, slightly worse acute pancreatitis  8/22 Abd Xray- partial bowel obstruction  8/23 Abd Xray- bowel obstruction  8/24 CT Progression of pancreatitis...could reflect developing pancreatic necrosis  8/24 Abd Xray- persistent ileus   GI Surgeries / Procedures:  2017 Roux-en-Y gastric Bypass  8/25 successful IR embolization of splenic artery   Central access: 12/05/20  TPN start date: 12/05/20  Nutritional Goals: Goal TPN rate is 105 mL/hr (provides 146 g of protein and ~2,630 kcals per day)  RD Assessment: Estimated Needs Total Energy Estimated Needs: 2500-2700 Total Protein Estimated Needs: 130-150 grams Total Fluid Estimated Needs: >/= 2.0 L  Current Nutrition:  TPN 8/26 clear liquid diet   Plan:  Increase TPN to 90 mL/hr at 1800, will provide 125g protein, 313g dextrose, 69g lipids, 2258 Kcals/d, meeting 90% of estimated needs Electrolytes in TPN (increase with rate increase): K to 7mEq/L, Ca 10mEq/L, Mg 74mEq/L, Phos 14 mmol/L; Continue Na 0 mEq/L, Maximized acetate Supplement K 40 mE IV  Add standard MVI and trace elements to TPN. Added Zinc, folic acid, thiamine. Increase insulin regular to 35U. Decrease to q4h mSSI and adjust as needed Monitor TPN labs daily until at goal then Mon/Thurs Discussed repletion with RD, recommended to  recheck levels in 2 weeks (~ mid Sept)  - PO Vitamin A 20,000 units daily for 14 days 8/26>> 9/8 - PO Vitamin C 1,000 mg daily for one month 8/26>> 9/24 F/u diet advancement and PO intake, wean TPN as able   Alphia Moh, PharmD, BCPS, BCCP Clinical Pharmacist  Please check AMION for all [facility] phone numbers After 10:00 PM, call Main Pharmacy [phone]

## 2020-12-09 DIAGNOSIS — K8521 Alcohol induced acute pancreatitis with uninfected necrosis: Secondary | ICD-10-CM | POA: Diagnosis not present

## 2020-12-09 DIAGNOSIS — R1011 Right upper quadrant pain: Secondary | ICD-10-CM | POA: Diagnosis not present

## 2020-12-09 LAB — BASIC METABOLIC PANEL
Anion gap: 11 (ref 5–15)
BUN: 11 mg/dL (ref 6–20)
CO2: 22 mmol/L (ref 22–32)
Calcium: 8.5 mg/dL — ABNORMAL LOW (ref 8.9–10.3)
Chloride: 107 mmol/L (ref 98–111)
Creatinine, Ser: 0.64 mg/dL (ref 0.61–1.24)
GFR, Estimated: >60 mL/min (ref >60)
Glucose, Bld: 172 mg/dL — ABNORMAL HIGH (ref 70–99)
Potassium: 3.7 mmol/L (ref 3.5–5.1)
Sodium: 140 mmol/L (ref 135–145)

## 2020-12-09 LAB — CBC
HCT: 23.2 % — ABNORMAL LOW (ref 39.0–52.0)
Hemoglobin: 7.6 g/dL — ABNORMAL LOW (ref 13.0–17.0)
MCH: 34.4 pg — ABNORMAL HIGH (ref 26.0–34.0)
MCHC: 32.8 g/dL (ref 30.0–36.0)
MCV: 105 fL — ABNORMAL HIGH (ref 80.0–100.0)
Platelets: 255 10*3/uL (ref 150–400)
RBC: 2.21 MIL/uL — ABNORMAL LOW (ref 4.22–5.81)
RDW: 20.1 % — ABNORMAL HIGH (ref 11.5–15.5)
WBC: 9.5 10*3/uL (ref 4.0–10.5)
nRBC: 0.6 % — ABNORMAL HIGH (ref 0.0–0.2)

## 2020-12-09 LAB — GLUCOSE, CAPILLARY
Glucose-Capillary: 145 mg/dL — ABNORMAL HIGH (ref 70–99)
Glucose-Capillary: 128 mg/dL — ABNORMAL HIGH (ref 70–99)
Glucose-Capillary: 152 mg/dL — ABNORMAL HIGH (ref 70–99)
Glucose-Capillary: 171 mg/dL — ABNORMAL HIGH (ref 70–99)
Glucose-Capillary: 200 mg/dL — ABNORMAL HIGH (ref 70–99)
Glucose-Capillary: 152 mg/dL — ABNORMAL HIGH (ref 70–99)

## 2020-12-09 LAB — MAGNESIUM: Magnesium: 2 mg/dL (ref 1.7–2.4)

## 2020-12-09 LAB — PHOSPHORUS: Phosphorus: 3.3 mg/dL (ref 2.5–4.6)

## 2020-12-09 MED ORDER — INSULIN ASPART 100 UNIT/ML IJ SOLN
0.0000 [IU] | Freq: Four times a day (QID) | INTRAMUSCULAR | Status: DC
  Administered 2020-12-09 – 2020-12-10 (×6): 3 [IU] via SUBCUTANEOUS

## 2020-12-09 MED ORDER — ZINC CHLORIDE 1 MG/ML IV SOLN
INTRAVENOUS | Status: DC
  Filled 2020-12-09: qty 1461.6

## 2020-12-09 MED ORDER — OXYCODONE HCL 5 MG PO TABS
5.0000 mg | ORAL_TABLET | Freq: Four times a day (QID) | ORAL | Status: AC | PRN
  Administered 2020-12-09 – 2020-12-12 (×8): 5 mg via ORAL
  Filled 2020-12-09 (×9): qty 1

## 2020-12-09 MED ORDER — POTASSIUM CHLORIDE 10 MEQ/100ML IV SOLN
10.0000 meq | INTRAVENOUS | Status: AC
  Administered 2020-12-09 (×3): 10 meq via INTRAVENOUS
  Filled 2020-12-09 (×3): qty 100

## 2020-12-09 MED ORDER — METOPROLOL TARTRATE 25 MG PO TABS
50.0000 mg | ORAL_TABLET | Freq: Two times a day (BID) | ORAL | Status: DC
  Administered 2020-12-09 – 2020-12-10 (×2): 50 mg via ORAL
  Filled 2020-12-09: qty 4
  Filled 2020-12-09: qty 2

## 2020-12-09 NOTE — Progress Notes (Addendum)
Subjective: CC: Doing well. Tolerating cld without abdominal pain, n/v. Passing flatus. BM yesterday.   Objective: Vital signs in last 24 hours: Temp:  [98.1 F (36.7 C)-98.9 F (37.2 C)] 98.9 F (37.2 C) (08/28 0700) Pulse Rate:  [89-137] 103 (08/28 0804) Resp:  [17-44] 30 (08/28 0800) BP: (119-163)/(74-106) 146/98 (08/28 0804) SpO2:  [88 %-100 %] 96 % (08/28 0800) Last BM Date: 12/08/20  Intake/Output from previous day: 08/27 0701 - 08/28 0700 In: 2443 [P.O.:562; I.V.:1578.9; IV Piggyback:302.1] Out: 3916.4 [Urine:3916.4] Intake/Output this shift: Total I/O In: 109.9 [I.V.:109.9] Out: 400 [Urine:400]  PE: Gen:  Alert, NAD, pleasant Pulm: Normal rate and effort on OT Abd: Soft, improved distension of the upper abdomen, NT, no peritonitis, +BS  Lab Results:  Recent Labs    12/08/20 0334 12/09/20 0412  WBC 10.2 9.5  HGB 7.4* 7.6*  HCT 22.5* 23.2*  PLT 217 255   BMET Recent Labs    12/08/20 0334 12/09/20 0412  NA 143 140  K 3.6 3.7  CL 113* 107  CO2 23 22  GLUCOSE 127* 172*  BUN 13 11  CREATININE 0.62 0.64  CALCIUM 8.1* 8.5*   PT/INR No results for input(s): LABPROT, INR in the last 72 hours. CMP     Component Value Date/Time   NA 140 12/09/2020 0412   K 3.7 12/09/2020 0412   CL 107 12/09/2020 0412   CO2 22 12/09/2020 0412   GLUCOSE 172 (H) 12/09/2020 0412   BUN 11 12/09/2020 0412   CREATININE 0.64 12/09/2020 0412   CALCIUM 8.5 (L) 12/09/2020 0412   PROT 5.1 (L) 12/06/2020 0730   ALBUMIN 2.4 (L) 12/06/2020 0730   AST 43 (H) 12/06/2020 0730   ALT 29 12/06/2020 0730   ALKPHOS 59 12/06/2020 0730   BILITOT 1.7 (H) 12/06/2020 0730   GFRNONAA >60 12/09/2020 0412   Lipase     Component Value Date/Time   LIPASE 507 (H) 11/27/2020 0208    Studies/Results: DG Chest Port 1 View  Result Date: 12/08/2020 CLINICAL DATA:  Respiratory abnormality. EXAM: PORTABLE CHEST 1 VIEW COMPARISON:  December 05, 2020. FINDINGS: The heart size and  mediastinal contours are within normal limits. Hypoinflation of the lungs is noted with mild bibasilar subsegmental atelectasis. Right-sided PICC line is noted with distal tip in expected position of cavoatrial junction. The visualized skeletal structures are unremarkable. IMPRESSION: Hypoinflation of the lungs is noted with mild bibasilar subsegmental atelectasis. Electronically Signed   By: Lupita Raider M.D.   On: 12/08/2020 08:23    Anti-infectives: Anti-infectives (From admission, onward)    Start     Dose/Rate Route Frequency Ordered Stop   12/03/20 1115  cefTRIAXone (ROCEPHIN) 2 g in sodium chloride 0.9 % 100 mL IVPB        2 g 200 mL/hr over 30 Minutes Intravenous Every 24 hours 12/03/20 1016 12/09/20 2359   12/02/20 1500  linezolid (ZYVOX) IVPB 600 mg        600 mg 300 mL/hr over 60 Minutes Intravenous Every 12 hours 12/02/20 1407 12/07/20 2315   12/02/20 1028  vancomycin variable dose per unstable renal function (pharmacist dosing)  Status:  Discontinued         Does not apply See admin instructions 12/02/20 1028 12/02/20 1407   12/02/20 0000  vancomycin (VANCOREADY) IVPB 1500 mg/300 mL  Status:  Discontinued        1,500 mg 150 mL/hr over 120 Minutes Intravenous Every 12 hours 12/01/20 1126 12/02/20  1028   12/01/20 1215  vancomycin (VANCOREADY) IVPB 2000 mg/400 mL        2,000 mg 200 mL/hr over 120 Minutes Intravenous  Once 12/01/20 1123 12/01/20 1433   11/30/20 1200  Ampicillin-Sulbactam (UNASYN) 3 g in sodium chloride 0.9 % 100 mL IVPB  Status:  Discontinued        3 g 200 mL/hr over 30 Minutes Intravenous Every 6 hours 11/30/20 1055 12/02/20 1522        Assessment/Plan 50 year old male with alcoholic pancreatitis SBO vs Ileus from above - 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 CT 8/24 w/ more of an ileus picture - oral  contrast has passed into the distal small bowel and colon. Now having bowel function. Tol CLD. Adv to Reg diet today by GI - On TPN. Consider weaning TPN if tolerates diet today  - Keep K > 4 and Mg > 2  - Mobilize as able     Subcapsular hematoma of the spleen ABL anemia  - s/p IR splenic artery embolization 8/25 by IR - s/p 3U total on 8/25.  - hgb stable from 8.0 > 7.9 > 7.4>7.6. Continue to trend.  - Will need splenic vaccines prior to discharge    FEN - Adv to Reg diet by GI. If tolerates, wean TPN. 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 GI is managing his pancreatitis Elevated LFT's -  Needs close monitoring, especially now that TNA has been initiated Aspiration pneumonitis with Staph aureus in sputum - comp abx  Alcohol withdrawal - on thiamine and folic acid. CIWA.  Acute metabolic encephalopathy HTN CHF L rib fx's   LOS: 13 days    Jacinto Halim , [facility] Surgery 12/09/2020, 9:01 AM Please see Amion for pager number during day hours 7:00am-4:30pm   No acute surgical issues.  Please call us back if any surgical issues develop. I personally saw the patient and performed a substantive portion of this encounter, including a complete performance of at least one of the key components (MDM, Hx and/or Exam), in conjunction with the Advanced Practice Provider Leary Roca, PA-C.  Wilmon Arms. Corliss Skains, MD, [facility]  General Surgery   12/09/2020 10:40 AM

## 2020-12-09 NOTE — Progress Notes (Signed)
PROGRESS NOTE FOR [facility]  Subjective: No complaints.  He is hungry.  Objective: Vital signs in last 24 hours: Temp:  [98.1 F (36.7 C)-98.9 F (37.2 C)] 98.9 F (37.2 C) (08/28 0700) Pulse Rate:  [89-137] 103 (08/28 0804) Resp:  [17-44] 30 (08/28 0800) BP: (119-163)/(74-106) 146/98 (08/28 0804) SpO2:  [88 %-100 %] 96 % (08/28 0800) Last BM Date: 12/08/20  Intake/Output from previous day: 08/27 0701 - 08/28 0700 In: 2353 [P.O.:562; I.V.:1488.9; IV Piggyback:302.1] Out: 3916.4 [Urine:3916.4] Intake/Output this shift: Total I/O In: 20 [I.V.:20] Out: 400 [Urine:400]  General appearance: alert and no distress Resp: clear to auscultation bilaterally Cardio: regular rate and rhythm GI: soft, non-tender; bowel sounds normal; no masses,  no organomegaly Extremities: extremities normal, atraumatic, no cyanosis or edema  Lab Results: Recent Labs    12/07/20 1345 12/08/20 0334 12/09/20 0412  WBC 10.7* 10.2 9.5  HGB 7.9* 7.4* 7.6*  HCT 24.2* 22.5* 23.2*  PLT 229 217 255   BMET Recent Labs    12/07/20 0414 12/08/20 0334 12/09/20 0412  NA 149* 143 140  K 3.5 3.6 3.7  CL 115* 113* 107  CO2 23 23 22   GLUCOSE 213* 127* 172*  BUN 21* 13 11  CREATININE 0.78 0.62 0.64  CALCIUM 8.7* 8.1* 8.5*   LFT No results for input(s): PROT, ALBUMIN, AST, ALT, ALKPHOS, BILITOT, BILIDIR, IBILI in the last 72 hours. PT/INR No results for input(s): LABPROT, INR in the last 72 hours. Hepatitis Panel No results for input(s): HEPBSAG, HCVAB, HEPAIGM, HEPBIGM in the last 72 hours. C-Diff No results for input(s): CDIFFTOX in the last 72 hours. Fecal Lactopherrin No results for input(s): FECLLACTOFRN in the last 72 hours.  Studies/Results: DG Chest Port 1 View  Result Date: 12/08/2020 CLINICAL DATA:  Respiratory abnormality. EXAM: PORTABLE CHEST 1 VIEW COMPARISON:  December 05, 2020. FINDINGS: The heart size and mediastinal contours are within normal limits. Hypoinflation of the lungs  is noted with mild bibasilar subsegmental atelectasis. Right-sided PICC line is noted with distal tip in expected position of cavoatrial junction. The visualized skeletal structures are unremarkable. IMPRESSION: Hypoinflation of the lungs is noted with mild bibasilar subsegmental atelectasis. Electronically Signed   By: Marijo Conception M.D.   On: 12/08/2020 08:23    Medications: Scheduled:  sodium chloride   Intravenous Once   vitamin C  1,000 mg Oral Daily   chlorhexidine  15 mL Mouth Rinse BID   chlorhexidine gluconate (MEDLINE KIT)  15 mL Mouth Rinse BID   Chlorhexidine Gluconate Cloth  6 each Topical Daily   feeding supplement  1 Container Oral TID BM   insulin aspart  0-15 Units Subcutaneous Q6H   mouth rinse  15 mL Mouth Rinse q12n4p   metoprolol tartrate  25 mg Oral BID   pantoprazole  40 mg Oral Daily   sodium chloride flush  10-40 mL Intracatheter Q12H   vitamin A  20,000 Units Oral Daily   Continuous:  sodium chloride 10 mL/hr at 12/08/20 2200   cefTRIAXone (ROCEPHIN)  IV Stopped (12/08/20 1317)   potassium chloride     TPN ADULT (ION) 90 mL/hr at 12/09/20 0800   TPN ADULT (ION)      Assessment/Plan: 1) ETOH pancreatitis. 2) Anemia - stable. 3) AMS - appears to be resolved.   The patient reports feeling hungry.  His diet will be advanced to a regular diet as he tolerated clears without any problems.  The WBC is normal and he does not have a  fever.  Plan: 1) Continue with antibiotics. 2) Advance to a regular diet. 3) Continue to monitor for any signs of ETOH withdrawal.  LOS: 13 days   Zyonna Vardaman D 12/09/2020, 8:55 AM

## 2020-12-09 NOTE — Progress Notes (Addendum)
PHARMACY - TOTAL PARENTERAL NUTRITION CONSULT NOTE   Indication: Prolonged ileus   Patient Measurements: Height: 5\' 10"  (177.8 cm) Weight: 124.3 kg (274 lb 0.5 oz) IBW/kg (Calculated) : 73 TPN AdjBW (KG): 82.8 Body mass index is 39.32 kg/m. Usual Weight: 118kg   Assessment: Patient presented to [facility] ED 8/15 with epigastric pain, found to have acute pancreatitis secondary to alcohol use. On 8/18, he developed altered mental status and hypoxia, for which he was transferred to the ICU. He was started on tube feeds 8/18-8/19. On 8/20 there was concern for ileus vs. SBO due to abdominal distension and general surgery was consulted. CT scan at that time showed distal SBO. CT 8/24 shows contrast through the distal small bowel and colon, more concerning for ileus. CT also shows progression of pancreatitis with increasing inflammatory changes, as well as a new large subscapular hematoma. Notably, patient has also received a Roux-en-Y Gastric Bypass Surgery in 2017, and has multiple nutrient deficiencies, including Vitamin A (11.3), Vitamin C (0.2), Zinc (42). Pharmacy has been consulted for TPN dosing to meet nutritional needs. Note patient is at risk for refeeding syndrome due to alcohol use and malnutrition at baseline.   Below is the timeline for feeding as denoted by 2018, RD:   8/16 - clear liquid diet initiated at 1529  8/17 - regular diet initiated at 0839 (PO 25-50% x 2 meals)  8/18 - found unresponsive on floor, intubated, trickle TF initiated  8/19 - TF advanced to goal  8/20 - TF d/c for ileus vs SBO, OG tube to suction  8/21 - CT abdomen/pelvis showing distal SBO  8/23 - extubated, OG tube removed  Glucose / Insulin: BG <180, A1C 5.5, received 13 units insulin/24 hr in SSI + 35 units in TPN Electrolytes: K 3.7 (received 40 mEq IV), CoCa 9.78, others wnl  Renal: Scr 0.64 , BUN wnl Hepatic: AST/ALT 43/29, Tbili 1.7, TG 102 (drawn appropriately) hepatic steatosis noted on CT 8/24.    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  8/25 successful IR embolization of splenic artery   Central access: 12/05/20  TPN start date: 12/05/20  Nutritional Goals: Goal TPN rate is 105 mL/hr (provides 146 g of protein and ~2,630 kcals per day)  RD Assessment: Estimated Needs Total Energy Estimated Needs: 2500-2700 Total Protein Estimated Needs: 130-150 grams Total Fluid Estimated Needs: >/= 2.0 L  Current Nutrition:  TPN 8/26 clear liquid diet > may adv to FLD 8/28  Plan:  Increase TPN to goal 105 mL/hr at 1800, will provide 146g protein, 365g dextrose, 80g lipids, 2633Kcals/d, meeting 100% of estimated needs  Electrolytes in TPN (adjusted to allow Na): Add back Na 35 mEq/L; Decrease K to 49mEq/L; Continue Ca 65mEq/L, Mg 10mEq/L, Phos 14 mmol/L; Maximized acetate Give Kcl 30 mEq IV Add standard MVI and trace elements to TPN. Added Zinc, folic acid, thiamine. Increase insulin regular to 45 units. Decrease to q6h mSSI and adjust as needed Monitor TPN labs daily until at goal then Mon/Thurs Discussed repletion with RD, recommended to recheck levels in 2 weeks (~ mid Sept)  - PO Vitamin A 20,000 units  daily for 14 days 8/26>> 9/8 - PO Vitamin C 1,000 mg daily for one month 8/26>> 9/24 F/u diet advancement and PO intake, wean TPN as able   Alphia Moh, PharmD, BCPS, BCCP Clinical Pharmacist  Please check AMION for all [facility] Pharmacy phone numbers After 10:00 PM, call Main Pharmacy [phone]

## 2020-12-09 NOTE — Progress Notes (Signed)
NAME:  Ernest Rodgers, MRN:  [id number], DOB:  10/30/1970, LOS: 13 ADMISSION DATE:  11/26/2020, CONSULTATION DATE:  11/29/20 REFERRING MD:  Rachael Darby - FPTS, CHIEF COMPLAINT:  Unresponsive and Hypoxic    History of Present Illness:  50 yo male former smoker presented with epigastric pain.  He fell prior to admission and found to have Lt 7th rib fracture.  Found to have acute pancreatitis.  Started on CIWA for ETOH withdrawal.  Developed altered mental status with hypoxia on 8/18 and transferred to ICU.  He works as a travel Engineer, civil (consulting). Extubated 8/23, severe hallucinations and agitation in the evening  Pertinent  Medical History  ETOH, s/p Roux en Y, Diastolic CHF, HTN, Depression, s/p cholecystectomy  Significant Hospital Events: Including procedures, antibiotic start and stop dates in addition to other pertinent events   8/15 admitted to [facility] for acute pancreatitis  8/16 EtOH withdrawal sx  8/17 incr CIWA scores  8/18 found to be unresponsive on floor and hypoxic. moving to ICU, intubated  8/20 ileus versus SBO,  8/21 General Surgery consulted for SBO 8/23 extubated 8/24 CT abdomen reveals peri-splenic hematoma 8/25 Splenic embolization by IR 8/28 greatly improved mentation. HDS. Ready for transfer out of ICU   Antibiotics Unasyn 8/19-8/21 Vancomycin 8/20 Linezolid 8/21>> 8/26 Ceftriaxone 8/22>>  Interim History / Subjective:   Hgb stable, still low  Mentation improved. No hallucinations   Cleared for reg diet   BM yesterday.     Objective   Blood pressure 135/84, pulse (!) 103, temperature 98.9 F (37.2 C), temperature source Oral, resp. rate (!) 33, height 5\' 10"  (1.778 m), weight 124.3 kg, SpO2 96 %.        Intake/Output Summary (Last 24 hours) at 12/09/2020 1209 Last data filed at 12/09/2020 1100 Gross per 24 hour  Intake 3009.24 ml  Output 4316.4 ml  Net -1307.16 ml   Filed Weights   12/04/20 0333 12/05/20 0630 12/06/20 2300  Weight: 126.7 kg 122.6 kg 124.3 kg     Physical Exam:  Appearance -obese middle aged M reclined in bed NAD  HEENT -NCAT flushed face. Anicteric sclera  Respiratory -Symmetrical chest expansion, even, unlabored  CV regular. S1s2 2+ pulses  GI -soft round ndnt + bowel sounds  GU- wnl Ext -no acute joint deformity, no cyanosis or clubbing  Skin - c/d/w  Neuro - Awake alert oriented x3 following commands    Resolved Hospital Problem list   AKI Hypernatremia  Assessment & Plan:     Acute metabolic encephalopathy -- improving  -multifactorial: etoh withdrawals, ICU delirium, infection, CNS depressing meds  P CIWA -- can likely de-escalate this soon given duration of admission  PRN haldol   Acute hypoxemic respiratory failure requiring intubation -in setting of aspiration pneumonia / pneumonitis, inflammatory response with acute pancreatitis, pulm edema  MRSA pneumonia  Finished 7 days of Linezolid P IS Mobility Or for SpO2 > 92 Rocephin through 8/28  Perisplenic hematoma s/p coil embo 8/25 Anemia - stable  P trend CBC  Alcoholic pancreatitis SBO vs ileus, improved  Continue TPN -- wean if tolerates reg diet  Cleared for reg diet  CCS s/o 8/28  HTN Chronic diastolic HF P cardiac monitoring as needed  Bid metop PRN labetalol  Acute nondisplaced left rib fractures PRN oxycodone   Hyperglycemia SSI    Best Practice (right click and "Reselect all SmartList Selections" daily)   Diet/type: Regular consistency (see orders) DVT prophylaxis: SCD.  GI prophylaxis: PPI Code Status:  full code Last  date of multidisciplinary goals of care discussion: updated family at bedside on 8/26 Stable for transfer out of ICU Will ask FPTS to resume care 8/29 with CCM off  Signature:   CCT: n/a    Tessie Fass MSN, AGACNP-BC [facility] Pulmonary/Critical Care Medicine Amion for pager  12/09/2020, 12:09 PM

## 2020-12-10 DIAGNOSIS — R109 Unspecified abdominal pain: Secondary | ICD-10-CM

## 2020-12-10 DIAGNOSIS — F10231 Alcohol dependence with withdrawal delirium: Secondary | ICD-10-CM | POA: Diagnosis not present

## 2020-12-10 DIAGNOSIS — K8521 Alcohol induced acute pancreatitis with uninfected necrosis: Secondary | ICD-10-CM | POA: Diagnosis not present

## 2020-12-10 DIAGNOSIS — N179 Acute kidney failure, unspecified: Secondary | ICD-10-CM | POA: Diagnosis not present

## 2020-12-10 LAB — MAGNESIUM: Magnesium: 1.9 mg/dL (ref 1.7–2.4)

## 2020-12-10 LAB — COMPREHENSIVE METABOLIC PANEL
ALT: 53 U/L — ABNORMAL HIGH (ref 0–44)
AST: 71 U/L — ABNORMAL HIGH (ref 15–41)
Albumin: 1.9 g/dL — ABNORMAL LOW (ref 3.5–5.0)
Alkaline Phosphatase: 104 U/L (ref 38–126)
Anion gap: 6 (ref 5–15)
BUN: 13 mg/dL (ref 6–20)
CO2: 22 mmol/L (ref 22–32)
Calcium: 8.3 mg/dL — ABNORMAL LOW (ref 8.9–10.3)
Chloride: 111 mmol/L (ref 98–111)
Creatinine, Ser: 0.64 mg/dL (ref 0.61–1.24)
GFR, Estimated: >60 mL/min (ref >60)
Glucose, Bld: 151 mg/dL — ABNORMAL HIGH (ref 70–99)
Potassium: 3.8 mmol/L (ref 3.5–5.1)
Sodium: 139 mmol/L (ref 135–145)
Total Bilirubin: 1.2 mg/dL (ref 0.3–1.2)
Total Protein: 5 g/dL — ABNORMAL LOW (ref 6.5–8.1)

## 2020-12-10 LAB — CBC
HCT: 23.6 % — ABNORMAL LOW (ref 39.0–52.0)
Hemoglobin: 7.8 g/dL — ABNORMAL LOW (ref 13.0–17.0)
MCH: 35 pg — ABNORMAL HIGH (ref 26.0–34.0)
MCHC: 33.1 g/dL (ref 30.0–36.0)
MCV: 105.8 fL — ABNORMAL HIGH (ref 80.0–100.0)
Platelets: 268 10*3/uL (ref 150–400)
RBC: 2.23 MIL/uL — ABNORMAL LOW (ref 4.22–5.81)
RDW: 19.3 % — ABNORMAL HIGH (ref 11.5–15.5)
WBC: 11.4 10*3/uL — ABNORMAL HIGH (ref 4.0–10.5)
nRBC: 3.3 % — ABNORMAL HIGH (ref 0.0–0.2)

## 2020-12-10 LAB — PHOSPHORUS: Phosphorus: 3.3 mg/dL (ref 2.5–4.6)

## 2020-12-10 LAB — TRIGLYCERIDES: Triglycerides: 102 mg/dL (ref <150)

## 2020-12-10 LAB — GLUCOSE, CAPILLARY
Glucose-Capillary: 191 mg/dL — ABNORMAL HIGH (ref 70–99)
Glucose-Capillary: 170 mg/dL — ABNORMAL HIGH (ref 70–99)
Glucose-Capillary: 180 mg/dL — ABNORMAL HIGH (ref 70–99)

## 2020-12-10 MED ORDER — ACETAMINOPHEN 325 MG PO TABS
650.0000 mg | ORAL_TABLET | Freq: Four times a day (QID) | ORAL | Status: DC | PRN
  Administered 2020-12-12 – 2020-12-13 (×4): 650 mg via ORAL
  Filled 2020-12-10 (×4): qty 2

## 2020-12-10 MED ORDER — POTASSIUM CHLORIDE CRYS ER 20 MEQ PO TBCR
40.0000 meq | EXTENDED_RELEASE_TABLET | Freq: Once | ORAL | Status: AC
  Administered 2020-12-10: 40 meq via ORAL
  Filled 2020-12-10: qty 2

## 2020-12-10 MED ORDER — ENSURE ENLIVE PO LIQD
237.0000 mL | Freq: Three times a day (TID) | ORAL | Status: DC
  Administered 2020-12-10 – 2020-12-13 (×7): 237 mL via ORAL

## 2020-12-10 MED ORDER — LISINOPRIL 10 MG PO TABS
10.0000 mg | ORAL_TABLET | Freq: Every day | ORAL | Status: DC
  Administered 2020-12-10 – 2020-12-13 (×4): 10 mg via ORAL
  Filled 2020-12-10 (×4): qty 1

## 2020-12-10 MED ORDER — POTASSIUM CHLORIDE 20 MEQ PO PACK: 40.0000 meq | PACK | Freq: Once | ORAL | Status: DC

## 2020-12-10 MED ORDER — METOPROLOL TARTRATE 25 MG PO TABS
25.0000 mg | ORAL_TABLET | Freq: Two times a day (BID) | ORAL | Status: AC
  Administered 2020-12-10 – 2020-12-12 (×5): 25 mg via ORAL
  Filled 2020-12-10 (×6): qty 1

## 2020-12-10 MED ORDER — ZINC SULFATE 220 (50 ZN) MG PO CAPS
220.0000 mg | ORAL_CAPSULE | Freq: Every day | ORAL | Status: DC
  Administered 2020-12-11 – 2020-12-13 (×3): 220 mg via ORAL
  Filled 2020-12-10 (×3): qty 1

## 2020-12-10 MED ORDER — RAMELTEON 8 MG PO TABS
8.0000 mg | ORAL_TABLET | Freq: Every day | ORAL | Status: DC
  Administered 2020-12-10 – 2020-12-12 (×3): 8 mg via ORAL
  Filled 2020-12-10 (×4): qty 1

## 2020-12-10 MED ORDER — INSULIN ASPART 100 UNIT/ML IJ SOLN
0.0000 [IU] | Freq: Three times a day (TID) | INTRAMUSCULAR | Status: DC
  Administered 2020-12-11: 1 [IU] via SUBCUTANEOUS

## 2020-12-10 MED ORDER — METOPROLOL SUCCINATE ER 50 MG PO TB24: 50.0000 mg | ORAL_TABLET | Freq: Every day | ORAL | Status: DC

## 2020-12-10 MED ORDER — FOLIC ACID 1 MG PO TABS
1.0000 mg | ORAL_TABLET | Freq: Every day | ORAL | Status: DC
  Administered 2020-12-11 – 2020-12-13 (×3): 1 mg via ORAL
  Filled 2020-12-10 (×3): qty 1

## 2020-12-10 MED ORDER — THIAMINE HCL 100 MG PO TABS
100.0000 mg | ORAL_TABLET | Freq: Every day | ORAL | Status: DC
  Administered 2020-12-11 – 2020-12-13 (×3): 100 mg via ORAL
  Filled 2020-12-10 (×3): qty 1

## 2020-12-10 NOTE — Progress Notes (Signed)
Ernest Rodgers    Since last GI Rodgers: Tolerating solid food last night and this AM without abd pain, nausea or vomiting.  Ernest Rodgers is worn out from his first rehab session today.  Objective: Vital signs in last 24 hours: Temp:  [98.1 F (36.7 C)-98.8 F (37.1 C)] 98.2 F (36.8 C) (08/29 1135) Pulse Rate:  [83-210] 84 (08/29 1135) Resp:  [23-42] 28 (08/29 1135) BP: (113-162)/(81-119) 138/81 (08/29 1135) SpO2:  [90 %-100 %] 99 % (08/29 1135) Last BM Date: 12/09/20 General: alert and oriented times 3 Heart: regular rate and rythm Abdomen: soft, non-tender, non-distended, normal bowel sounds  Lab Results: Recent Labs    12/08/20 0334 12/09/20 0412 12/10/20 0334  WBC 10.2 9.5 11.4*  HGB 7.4* 7.6* 7.8*  PLT 217 255 268  MCV 105.1* 105.0* 105.8*   Recent Labs    12/08/20 0334 12/09/20 0412 12/10/20 0334  NA 143 140 139  K 3.6 3.7 3.8  CL 113* 107 111  CO2 23 22 22   GLUCOSE 127* 172* 151*  BUN 13 11 13   CREATININE 0.62 0.64 0.64  CALCIUM 8.1* 8.5* 8.3*   Recent Labs    12/10/20 0334  PROT 5.0*  ALBUMIN 1.9*  AST 71*  ALT 53*  ALKPHOS 104  BILITOT 1.2   No results for input(s): INR in the last 72 hours.   Studies/Results: No results found.   Medications: Scheduled Meds:  vitamin C  1,000 mg Oral Daily   chlorhexidine  15 mL Mouth Rinse BID   Chlorhexidine Gluconate Cloth  6 each Topical Daily   feeding supplement  237 mL Oral TID BM   [START ON 12/11/2020] folic acid  1 mg Oral Daily   insulin aspart  0-15 Units Subcutaneous Q6H   lisinopril  10 mg Oral Daily   mouth rinse  15 mL Mouth Rinse q12n4p   metoprolol tartrate  25 mg Oral BID   pantoprazole  40 mg Oral Daily   ramelteon  8 mg Oral QHS   sodium chloride flush  10-40 mL Intracatheter Q12H   [START ON 12/11/2020] thiamine  100 mg Oral Daily   vitamin A  20,000 Units Oral Daily   [START ON 12/11/2020] zinc sulfate  220 mg Oral Daily   Continuous Infusions:  sodium  chloride 10 mL/hr at 12/08/20 2200   TPN ADULT (ION) 55 mL/hr at 12/10/20 1108   PRN Meds:.acetaminophen, iohexol, ipratropium-albuterol, lidocaine, ondansetron (ZOFRAN) IV, oxyCODONE, sodium chloride flush, white petrolatum    Assessment/Plan: 50 y.o. male with slowly improving severe acute etoh related pancreatitis  Ernest Rodgers is tolerating solid food. OK to d/c his IV nutrition.  Hopefully Ernest Rodgers will indeed abstain from further etoh, I explained that Ernest Rodgers is very likely to have further problems with his health and specifically his pancreas if Ernest Rodgers resumes etoh.  Signing off.  Ernest Rodgers should follow up with GI PRN.  Please call or page with any further questions or concerns.  12/12/20, MD  12/10/2020, 12:17 PM [facility] Pager [phone]

## 2020-12-10 NOTE — TOC Progression Note (Signed)
Transition of Care [facility]) - Progression Note    Patient Details  Name: Ival Pacer MRN: [id number] Date of Birth: [birth year]  Transition of Care [facility]) CM/SW Contact  Epifanio Lesches, RN Phone Number: 12/10/2020, 10:08 AM  Clinical Narrative:    Admitted with acute pancreatitis.Hx of PMH is significant for GERD, HTN, MDD, ETOH  use disorder, anxiety, depression, class 2 obesity s/p gastric bypass. 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 PTA independent with ADL'S, no DME usage. Pt is from [place]. NCM spoke with wife regarding d/c planning. Wife very interested in pt transitioning to CIR. NCM shared CIR is following for potential admit...unsure if pt able to tolerate intensity ,watching for improvement with tolerance. NCM made IR admission coordinator aware per MD pt is stable for transfer out of ICU and to [facility] medicine service on a  progressive unit.   Information provided by insurance CM: Elyn CM with [employer], [phone], ext. [phone]; fax # [phone], can help assist with TOC if needed.  TOC team will continue to monitor and assist with needs.  Expected Discharge Plan: Skilled Nursing Facility (vs CIR) Barriers to Discharge: Continued Medical Work up  Expected Discharge Plan and Services Expected Discharge Plan: Skilled Nursing Facility (vs CIR)   Discharge Planning Services: CM Consult   Living arrangements for the past 2 months: Apartment                                       Social Determinants of Health (SDOH) Interventions    Readmission Risk Interventions No flowsheet data found.

## 2020-12-10 NOTE — Progress Notes (Signed)
Inpatient Rehab Admissions Coordinator:   At this time we are recommending an inpatient rehab consult and I will request an order per our protocol.   Estill Dooms, PT, DPT Admissions Coordinator [phone] 12/10/20  11:45 AM

## 2020-12-10 NOTE — Progress Notes (Signed)
Inpatient Rehabilitation Admissions Coordinator   Inpatient rehab consult received, I met with patient at bedside and then with his wife upon her arrival. I discussed goals and expectations of a possible CIR admit. They prefer CIR before returning home with wife. Wife is arranging 24/7 supervision when he returns home while she works. I will begin Auth with [employer] for a possible Cir admit pending their approval when he is felt medically ready. I encourage him up out of bed in chair as much as able.  Danne Baxter, RN, MSN Rehab Admissions Coordinator [phone] 12/10/2020 11:58 AM

## 2020-12-10 NOTE — Progress Notes (Addendum)
Family Medicine Teaching Service Daily Progress Note Intern Pager: [phone]  Patient name: Ernest Rodgers Medical record number: [id number] Date of birth: June 29, 1970 Age: 50 y.o. Gender: male  Primary Care Provider: Pcp, No Consultants: pulm, gen surgery, GI s/o Code Status: Full  Pt Overview and Major Events to Date:  8/15 Admitted to FPTS for acute pancreatitis  8/16 Sx of alcohol withdrawal 8/18 Became unresponsive on the floor and hypoxic 8/20 Found to have ileus vs SBO 8/23 extubated  8/24 Evidence of peri-splenic hematoma  8/25 Splenic embolization by IR 8/29 Transferred back to FPTS  Assessment and Plan:  Ernest Rodgers is a 50 year old male who presented with acute EtOH pancreatitis, was briefly intubated in the ICU and extubated subsequently on 8/23.  He had developed AMS with severe hallucinations and agitation after extubation.  Patient was also found to have aspiration pneumonitis and has completed a course of linezolid and ceftriaxone.  Patient had a splenic hematoma that was embolized by IR on 8/25 that was likely caused by previous fall versus pancreatitis.  Tachycardia and tachypnea Pt has had presence of elevated HR at several intervals throughout the night and RR to 31. In the room, he was able to speak but did note that he has moments when he feels like he cannot catch his breath.  -Continue with cxray and EKG-EKG shows sinus tachycardia -Consider CTPA, cultures and beginning abx if pt gets worse/becomes febrile   EtOH pancreatitis White blood cell count 11.4 > 9.6 and has been afebrile overnight.  He has had periods of tachycardia and has been tachypneic as well overnight.  He has been tolerating a regular diet and has discontinued TPN. -Continue supportive care -PT/OT consulted  Aspiration pneumonitis Found to be MRSA in the sputum and patient has completed a course of linezolid and ceftriaxone.  Patient is breathing on room air.  Splenic hematoma acute blood loss  anemia likely in setting of ACD Status postembolization by IR on 8/25, hemoglobin trend 7.4 > 7.6 > 7.8 >8.1. -Status post 3 units packed red blood cells on 8/25 -Hemoglobin remained stable today -Blood smear ordered  -Need splenic vaccines prior to discharge  SBO versus ileus GI was following however patient is having bowel movements and has been tolerating a regular diet -GI signed off -Continue with regular diet as tolerated   Alcohol withdrawal CIWA's have been 0 however the most recent was 4.  On exam today he remains slightly tremulous  Ativan can be given as needed Ramelteon at night Folate and thiamine  Elevated LFTs AST today 54 and ALT 57 down from AST 71, previous ALT 53.   Sacral pain, resolved  Patient does not have any evidence of an ulcer present on physical exam, he still has some sacral pain and is weak. -PT/OT following working on mobility  Hypertension Patients BP today is 124/92.  Home medications include metoprolol succinate 50mg  1xdaily, lisinopril 10 mg -Continue with Metoprolol tartrate 25 mg 2 times daily and lisinopril 10 mg   GERD Pantoprazole 40 mg daily  History of TPN and pancreatitis He has been on sliding scale insulin for his TPN.  As he was changed back to regular diet we continued with sliding scale insulin with meals and at night -Insert PIV and remove PICC -D/C insulin   History of fall acute nondisplaced left rib fractures He has history of fall with left rib fractures.  They are still sore. Healing and stable  Acute metabolic encephalopathy Multifactorial causes include EtOH withdrawal, delirium  while in the ICU, medications, and infection.  There is no evidence of delirium overnight without hallucinations.  He denies any hallucinations today.  Depression Home meds: Fluoxetine 20 mg every morning -We will restart home medication  FEN/GI: regular diet  PPx: Subq Heparin  Dispo: CIR in 2-3 days. Barriers include clinical  improvement and placement   Subjective:  Pt reports the he cannot really catch his breath at certain moments.  He also has a cough and some nasal congestion.  He was requesting a breathing treatment when I was in the room and was sitting in the chair.  Denies any chest pain or abdominal pain.  He is eating well without complication.  Overall he is fairly fatigued but denies any hallucinations.  He is requesting something to help him sleep at night.   Objective: Temp:  [97.7 F (36.5 C)-98.8 F (37.1 C)] 98.8 F (37.1 C) (08/30 1132) Pulse Rate:  [86-211] 93 (08/30 1227) Resp:  [13-39] 21 (08/30 1227) BP: (89-154)/(69-102) 129/95 (08/30 1227) SpO2:  [91 %-100 %] 96 % (08/30 1227) Physical Exam Constitutional:      General: He is not in acute distress.    Appearance: He is well-developed.  Cardiovascular:     Rate and Rhythm: Regular rhythm. Tachycardia present.     Heart sounds: No murmur heard. Pulmonary:     Effort: No respiratory distress.     Comments: Intermittent moments of increased WOB  Abdominal:     General: Bowel sounds are normal.     Palpations: Abdomen is soft.     Tenderness: There is no abdominal tenderness.  Skin:    General: Skin is warm and dry.  Neurological:     Mental Status: He is alert.  Psychiatric:        Mood and Affect: Mood normal.        Behavior: Behavior normal.     Laboratory: Recent Labs  Lab 12/09/20 0412 12/10/20 0334 12/11/20 0340  WBC 9.5 11.4* 9.6  HGB 7.6* 7.8* 8.1*  HCT 23.2* 23.6* 25.2*  PLT 255 268 281   Recent Labs  Lab 12/06/20 0730 12/07/20 0414 12/09/20 0412 12/10/20 0334 12/11/20 0340  NA 151*   < > 140 139 137  K 3.2*   < > 3.7 3.8 3.8  CL 119*   < > 107 111 107  CO2 23   < > 22 22 21*  BUN 35*   < > 11 13 13   CREATININE 0.87   < > 0.64 0.64 0.61  CALCIUM 8.1*   < > 8.5* 8.3* 8.3*  PROT 5.1*  --   --  5.0* 5.2*  BILITOT 1.7*  --   --  1.2 1.2  ALKPHOS 59  --   --  104 125  ALT 29  --   --  53* 57*  AST  43*  --   --  71* 54*  GLUCOSE 181*   < > 172* 151* 150*   < > = values in this interval not displayed.     , MD 12/11/2020, 12:49 PM PGY-1, [facility] Family Medicine FPTS Intern pager: [phone], text pages welcome

## 2020-12-10 NOTE — Progress Notes (Signed)
PHARMACY - TOTAL PARENTERAL NUTRITION CONSULT NOTE   Indication: Prolonged ileus   Patient Measurements: Height: _0  (177.8 cm) Weight: 124.3 kg (274 lb 0.5 oz) IBW/kg (Calculated) : 73 TPN AdjBW (KG): 82.8 Body mass index is 39.32 kg/m. Usual Weight: 118kg   Assessment: Patient presented to [facility] ED 8/15 with epigastric pain, found to have acute pancreatitis secondary to alcohol use. On 8/18, he developed altered mental status and hypoxia, for which he was transferred to the ICU. He was started on tube feeds 8/18-8/19. On 8/20 there was concern for ileus vs. SBO due to abdominal distension and general surgery was consulted. CT scan at that time showed distal SBO. CT 8/24 shows contrast through the distal small bowel and colon, more concerning for ileus. CT also shows progression of pancreatitis with increasing inflammatory changes, as well as a new large subscapular hematoma. Notably, patient has also received a Roux-en-Y Gastric Bypass Surgery in 2017, and has multiple nutrient deficiencies, including Vitamin A (11.3), Vitamin C (0.2), Zinc (42). Pharmacy has been consulted for TPN dosing to meet nutritional needs. Note patient is at risk for refeeding syndrome due to alcohol use and malnutrition at baseline.   Below is the timeline for feeding as denoted by Gustavus Bryant, RD:   8/16 - clear liquid diet initiated at 1529  8/17 - regular diet initiated at 0839 (PO 25-50% x 2 meals)  8/18 - found unresponsive on floor, intubated, trickle TF initiated  8/19 - TF advanced to goal  8/20 - TF d/c for ileus vs SBO, OG tube to suction  8/21 - CT abdomen/pelvis showing distal SBO  8/23 - extubated, OG tube removed  Glucose / Insulin: BG fluctuating, 150-200 , A1C 5.5, received 17 units insulin/24 hr in SSI + 45 units in TPN Electrolytes: K 3.8, CoCa WNL, Mg 1.9, Phos 3.3  Renal: Scr 0.64 , BUN wnl Hepatic: AST/ALT 71/53, Tbili 1.2, TG 102 (drawn appropriately) hepatic steatosis noted on CT 8/24.    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  8/25 successful IR embolization of splenic artery   Central access: 12/05/20  TPN start date: 12/05/20  Nutritional Goals: Goal TPN rate is 105 mL/hr (provides 146 g of protein and ~2,630 kcals per day)  RD Assessment: Estimated Needs Total Energy Estimated Needs: 2500-2700 Total Protein Estimated Needs: 130-150 grams Total Fluid Estimated Needs: >/= 2.0 L  Current Nutrition:  TPN 8/26 clear liquid diet 8/28 Regular diet  -Patient ate 1/4 of a chicken tender and a few bites of mac and cheese as well as 2 Boost Breeze (each 250 kcal and 9 grams of protein)  Plan:  Will taper TPN to half rate now and plan to discontinue at 1800 today Give Kcl 40 mEq x1 po Transition Zinc, folic acid, thiamine to po Continue q6h mSSI and adjust as needed IVMF per provider  Discussed repletion with RD, recommended to recheck levels in 2 weeks (~ mid Sept)  - PO Vitamin A 20,000 units daily for 14 days 8/26>> 9/8 - PO Vitamin C 1,000 mg daily for one month 8/26>> 9/24   Donnald Garre, PharmD Clinical Pharmacist  Please check AMION for all [place] numbers After 10:00 PM, call [place] [phone]

## 2020-12-10 NOTE — Progress Notes (Addendum)
Family Medicine Teaching Service Daily Progress Note Intern Pager: [phone]  Patient name: Ernest Rodgers Medical record number: [id number] Date of birth: 06-05-1970 Age: 50 y.o. Gender: male  Primary Care Provider: Pcp, No Consultants: pulm, gen surgery, GI Code Status: Full  Pt Overview and Major Events to Date:  8/15 Admitted to FPTS for acute pancreatitis  8/16 Sx of alcohol withdrawal 8/18 Become unresponsive on the floor and hypoxic. Moved to ICU 8/20 Found to have ileus vs SBO 8/23 Extubated  8/24 Evidence of peri-splenic hematoma  8/25 Splenic embolization by IR 8/29 transferred back to FPTS   Assessment and Plan:  Ernest Rodgers is a 50 yo M who presented with acute EtOH pancreatitis, was briefly intubate in the ICU and extubated subsequently 8/23. He had developed AMS with severe hallucinations and agitation after extubation. Pt was also found to have aspiration pneumonitis (completed linezolid and CTX course), and a splenic hematoma that was embolized by IR on 8/25 caused by previous fall vs pancreatitis.   EtOH pancreatitis  WBC 11.4, has remained afebrile. He has been on TNA, but can progress to CLD and further if tolerated, per GI. Regular diet has been ordered for the patient.  -If tolerate eating, can discontinue TPN -Continue supportive care -PT/OT consulted   Aspiration pneumonitis  MRSA in sputum  -Completed course of linezolid and CTX   Splenic hematoma  Acute blood loss anemia  S/p embolization by IR on 8/25, Hgb 7.4>7.6>7.8 -S/P 3U pRBCs 8/25 -Monitor Hgb  -Splenic vaccines prior to d/c   SBO vs Ileus  Pt is having bowel movements and has tolerated a CLD.  GI has been following.  - Appreciate GI recs  - On TPN. Will d/c TPN once tolerating regular diet  -Keep K>4 and Mg>2 per GI  Alcohol withdrawal  CIWA 8>3>4. Mildly tremulous on exam today  -Ativan can be given as needed  -Ramelteon at night  -Folate and thiamine   Elevated LFTs AST 43>71 ALT  29>53  -continue to monitor   Sacral Pain  He reports of some sacral pain with no evidence of sacral ulcer -Work on mobility with PT/OT  HTN Pt has elevated BP 140/119 with range 140s-160s/90s-100s. Home meds: Metoprolol succinate 50 1xdaily and lisinopril 10mg  added  -Metoprolol tartrate 25 2xdaily and lisinopril 10mg  added   GERD Pantoprazole 40 mg daily   TPN  Pt has been receiving SSI for TPN. He is changing back to a regular diet and if tolerated, he will not need the TPN.   -Will continue with SSI with meals and at night  Hx of Fall  Acute nondisplaced left rib fractures  Pt has a hx of a fall that results in left rib fractures -Healing and stable   Acute metabolic encephalopathy, resolved   Cause of this is likely multifactorial including ETOH withdrawal, delirium while in ICU, medications and infection.  No evidence of delirium today.   FEN/GI: Regular diet  PPx: SCD-holding DVT ppx  Dispo:CIR in 2-3 days. Barriers include clinical improvement and placement.   Subjective:  Pt reports that he is feeling well today, only complaint being sacral pain and overall weakness. Denies CP, SOB, abd pain.   Objective: Temp:  [98.1 F (36.7 C)-98.8 F (37.1 C)] 98.2 F (36.8 C) (08/29 1135) Pulse Rate:  [83-210] 95 (08/29 1500) Resp:  [23-42] 27 (08/29 1500) BP: (127-161)/(81-119) 128/89 (08/29 1200) SpO2:  [90 %-100 %] 100 % (08/29 1500) Physical Exam Constitutional:      General: He  is not in acute distress.    Appearance: He is not ill-appearing.  Cardiovascular:     Rate and Rhythm: Regular rhythm. Tachycardia present.     Heart sounds: No murmur heard. Pulmonary:     Effort: Pulmonary effort is normal.     Breath sounds: Normal breath sounds.  Abdominal:     General: Bowel sounds are normal.     Palpations: Abdomen is soft.     Tenderness: There is no abdominal tenderness.  Neurological:     Mental Status: He is alert.  Psychiatric:        Mood and Affect:  Mood normal.        Behavior: Behavior normal.  Mildly tremulous on physical exam  No evidence of sacral ulcer   Laboratory: Recent Labs  Lab 12/08/20 0334 12/09/20 0412 12/10/20 0334  WBC 10.2 9.5 11.4*  HGB 7.4* 7.6* 7.8*  HCT 22.5* 23.2* 23.6*  PLT 217 255 268   Recent Labs  Lab 12/05/20 0150 12/05/20 0416 12/06/20 0730 12/07/20 0414 12/08/20 0334 12/09/20 0412 12/10/20 0334  NA 151*   < > 151*   < > 143 140 139  K 3.3*   < > 3.2*   < > 3.6 3.7 3.8  CL 119*  --  119*   < > 113* 107 111  CO2 23  --  23   < > 23 22 22   BUN 25*  --  35*   < > 13 11 13   CREATININE 0.92  --  0.87   < > 0.62 0.64 0.64  CALCIUM 8.3*  --  8.1*   < > 8.1* 8.5* 8.3*  PROT 4.9*  --  5.1*  --   --   --  5.0*  BILITOT 1.6*  --  1.7*  --   --   --  1.2  ALKPHOS 64  --  59  --   --   --  104  ALT 41  --  29  --   --   --  53*  AST 37  --  43*  --   --   --  71*  GLUCOSE 160*  --  181*   < > 127* 172* 151*   < > = values in this interval not displayed.     , MD 12/10/2020, 3:45 PM PGY-1, [facility] Family Medicine FPTS Intern pager: [phone], text pages welcome

## 2020-12-10 NOTE — Plan of Care (Signed)
Goal met 

## 2020-12-10 NOTE — Progress Notes (Signed)
Occupational Therapy Treatment Patient Details Name: Ernest Rodgers MRN: [id number] DOB: [birth year] Today's Date: 12/10/2020    History of present illness 50 yo male presents to [facility] on 8/15 with L rib pain, epigastric pain radiating to back after jump into ball pit. CT chest shows acute pancreatitis with likely reactive changes to duodenum, hepatic steatosis, L 6-8th rib fx. on CIWA. On 8/18, pt with rapid response called due to tachycardia and hypoxia to 60s suspect due to HF vs ARDS, ETT 8/18-8/23 . CT abd shows small bowel obstruction on 8/21, OGT 8/21-8/23. Worsening agitation and hallucinations starting 8/23. s/p successful splenic artery embolization on 8/25. 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   OT comments  Pt making good progress with all adls requiring only 1 person assist today. Pt remains very limited by activity tolerance and fatigue. Pt with HR up to 130 during standing tasks. Feel rehab remains best option. Will continue to follow with focus on adls in standing and functional adls with use of walker.   Follow Up Recommendations  CIR    Equipment Recommendations   (tbd)    Recommendations for Other Services      Precautions / Restrictions Precautions Precautions: Fall Precaution Comments: Monitor HR Restrictions Weight Bearing Restrictions: No       Mobility Bed Mobility Overal bed mobility: Needs Assistance Bed Mobility: Rolling;Sidelying to Sit Rolling: Min guard Sidelying to sit: Min assist;HOB elevated   Sit to supine: Max assist   General bed mobility comments: assist to get BLES back in bed.    Transfers Overall transfer level: Needs assistance Equipment used: 1 person hand held assist;Rolling walker (2 wheeled) Transfers: Sit to/from [employer] Sit to Stand: Min assist Stand pivot transfers: Mod assist       General transfer comment: Pt  transferred with mod assist x1 on first attempt to [facility] and then min assist from [facility] (higher elevation) to bed.  Pt fatigued very quickly in standing only tolerating about 30 seconds on feet with HR to 130.    Balance Overall balance assessment: Needs assistance Sitting-balance support: No upper extremity supported;Feet supported Sitting balance-Leahy Scale: Fair Sitting balance - Comments: EOB without assist until fatigued.     Standing balance-Leahy Scale: Poor Standing balance comment: bil UE support on RW                           ADL either performed or assessed with clinical judgement   ADL Overall ADL's : Needs assistance/impaired     Grooming: Set up;Sitting   Upper Body Bathing: Minimal assistance;Sitting   Lower Body Bathing: Maximal assistance;Sit to/from stand;Cueing for compensatory techniques   Upper Body Dressing : Sitting;Moderate assistance   Lower Body Dressing: Maximal assistance;Sit to/from stand;Cueing for compensatory techniques Lower Body Dressing Details (indicate cue type and reason): assist to donn socks and shoes. Pt unable to maintain standing without use of both hands on walker to fasten clothing. Toilet Transfer: Moderate assistance;BSC;Stand-pivot;RW Toilet Transfer Details (indicate cue type and reason): assist to pivot to [facility]. Pt very SOB and fatigued. Toileting- Clothing Manipulation and Hygiene: Maximal assistance;Sit to/from stand Toileting - Clothing Manipulation Details (indicate cue type and reason): Pt stood with walker for short amout of time while another person pulls pants up.  Pt unable to let go of walker with one hand at this time.     Functional  mobility during ADLs: Moderate assistance;Rolling walker General ADL Comments: Pt very limited due to extreme weakness and SOB with all activity. Pt unable to access BLEs at this time due to weakness. Feel pt will imrpove with adls once endurance and LE strength improves.      Vision   Vision Assessment?: No apparent visual deficits   Perception     Praxis      Cognition Arousal/Alertness: Awake/alert Behavior During Therapy: WFL for tasks assessed/performed Overall Cognitive Status: Within Functional Limits for tasks assessed                                 General Comments: Pt much improved with cognition today. Pt oriented, memory much improved and answering safety questions approropriately.        Exercises General Exercises - Lower Extremity Long Arc Quad: AROM;Both;Seated;10 reps Hip Flexion/Marching: AROM;Both;10 reps;Seated   Shoulder Instructions       General Comments Pt much improved from evaluation day with all adls. Pt continunes to require mod assist overall with adls and fatigues quickly. Feel CIR, at this point, remains best option.    Pertinent Vitals/ Pain       Pain Assessment: 0-10 Pain Score: 6  Pain Location: ribs and legs Pain Descriptors / Indicators: Guarding;Aching Pain Intervention(s): Limited activity within patient's tolerance;Monitored during session;Patient requesting pain meds-RN notified;Repositioned  Home Living Family/patient expects to be discharged to:: Private residence Living Arrangements: Spouse/significant other Available Help at Discharge: Family Type of Home: Mobile home Home Access: Stairs to enter Entrance Stairs-Number of Steps: 5                              Prior Functioning/Environment              Frequency  Min 2X/week        Progress Toward Goals  OT Goals(current goals can now be found in the care plan section)  Progress towards OT goals: Progressing toward goals  Acute Rehab OT Goals Patient Stated Goal: start walking OT Goal Formulation: With patient/family Time For Goal Achievement: 12/21/20 Potential to Achieve Goals: Good ADL Goals Pt Will Perform Grooming: with supervision;standing Pt Will Perform Upper Body Dressing: with  set-up;sitting Pt Will Perform Lower Body Dressing: with supervision;sit to/from stand;with adaptive equipment Pt Will Transfer to Toilet: with supervision;ambulating Pt Will Perform Toileting - Clothing Manipulation and hygiene: with supervision;sit to/from stand Pt Will Perform Tub/Shower Transfer: with supervision;ambulating;shower seat Additional ADL Goal #1: Patient will follow 1-step verbal commands with 95% accuracy in prep for ADLs.  Plan Discharge plan remains appropriate    Co-evaluation                 AM-PAC OT "6 Clicks" Daily Activity     Outcome Measure   Help from another person eating meals?: A Little Help from another person taking care of personal grooming?: A Little Help from another person toileting, which includes using toliet, bedpan, or urinal?: A Lot Help from another person bathing (including washing, rinsing, drying)?: A Lot Help from another person to put on and taking off regular upper body clothing?: A Lot Help from another person to put on and taking off regular lower body clothing?: A Lot 6 Click Score: 14    End of Session Equipment Utilized During Treatment: Rolling walker  OT Visit Diagnosis: Unsteadiness on feet (R26.81);Muscle weakness (generalized) (  M62.81)   Activity Tolerance Patient tolerated treatment well;Patient limited by fatigue   Patient Left in bed;with bed alarm set;with call bell/phone within reach   Nurse Communication Patient requests pain meds;Mobility status        Time: 2122-4825 OT Time Calculation (min): 31 min  Charges: OT General Charges $OT Visit: 1 Visit OT Treatments $Self Care/Home Management : 23-37 mins   Hope Budds 12/10/2020, 10:12 AM

## 2020-12-10 NOTE — Progress Notes (Signed)
Nutrition Follow-up  DOCUMENTATION CODES:   Not applicable  INTERVENTION:   Add Ensure Enlive/Plus po TID, each supplement provides 350 kcal and 13-20 grams of protein Continue supplementation of vitamins A & C, and zinc Recommend recheck vitamin A, vitamin C, and zinc levels later this week (9/1).  NUTRITION DIAGNOSIS:   Increased nutrient needs related to acute illness as evidenced by estimated needs.  Ongoing  GOAL:   Patient will meet greater than or equal to 90% of their needs  Progressing  MONITOR:   Diet advancement, Labs, Weight trends, I & O's, Other (TPN)  REASON FOR ASSESSMENT:   Ventilator, Consult Enteral/tube feeding initiation and management  ASSESSMENT:   Patient with PMH significant for HTN, CHF, s/p Roux en Y, and ETOH use. Presents this admission with acute pancreatitis and ETOH withdrawal.  8/15 - admit, NPO 8/16 - clear liquid diet initiated at 1529 8/17 - regular diet initiated at 0839 (PO 25-50% x 2 meals) 8/18 - found unresponsive on floor, intubated, trickle TF initiated 8/19 - TF advanced to goal 8/20 - TF d/c for ileus vs SBO, OG tube to suction 8/21 - CT abdomen/pelvis showing distal SBO 8/23 - extubated, OG tube removed 8/24 - TPN initiated 8/25 - S/P splenic artery embolization  8/26 - starting clear liquid diet with Boost Breeze TID 8/28 - diet advanced to regular 8/29 - TPN d/c; change supplement to Ensure Enlive/Plus TID  Vitamin/Mineral Profile (8/18):  Vitamin A: 11.3 (low)  Vitamin C: 0.2 (low)  Zinc: 42 (low)  Vitamin D, Copper, and vitamin B12 were not low.   Now receiving oral replacement of vitamin C, vitamin A, and zinc.  TPN is being discontinued today.   Diet was advanced to regular 8/28. Meal intakes 10-35-20% 8/28. He is receiving Boost Breeze supplement TID between meals. Will change PO supplement to Ensure Enlive/Plus for more calories and protein.  Admit weight: 112 kg Current weight: 124.3 kg  (8/25)  Will utilize admit weight of 112 kg as EDW d/t ongoing edema and positive fluid status.  Medications reviewed and include vitamin C, folic acid, Novolog SSI q 6 hours, protonix, KCl, thiamine, vitamin A, zinc sulfate.  Labs reviewed.  CBG: 191 this AM  UOP: 7,280 ml x 24 hours I/O's: +10.6 L since admit  Diet Order:   Diet Order             Diet regular Room service appropriate? Yes; Fluid consistency: Thin  Diet effective now                   EDUCATION NEEDS:   Not appropriate for education at this time  Skin:  Skin Assessment: Skin Integrity Issues: Skin Integrity Issues:: Other (Comment) Other: non pressure wound to lower lip  Last BM:  8/28 type 5, medium, brown, green  Height:   Ht Readings from Last 1 Encounters:  11/26/20 5\' 10"  (1.778 m)    Weight:   Wt Readings from Last 1 Encounters:  12/06/20 124.3 kg    BMI:  Body mass index is 39.32 kg/m.  Estimated Nutritional Needs:   Kcal:  2500-2700  Protein:  130-150 grams  Fluid:  >/= 2.0 L   12/08/20, RD, LDN, CNSC Please refer to Amion for contact information.

## 2020-12-10 NOTE — Progress Notes (Signed)
FPTS Brief Progress Note  S Saw patient at bedside this evening. Patient was sitting up in bed comfortably. Denies auditory or visual hallucinations. He reports generalized weakness and generalized pain in upper body.   O: BP 134/86 (BP Location: Left Arm)   Pulse (!) 105   Temp 98.5 F (36.9 C) (Oral)   Resp (!) 35   Ht 5\' 10"  (1.778 m)   Wt 124.3 kg   SpO2 94%   BMI 39.32 kg/m    General: Alert, no acute distress, pleasant  Cardio: Normal S1 and S2, RRR, no r/m/g Pulm: bibasalar crackles, normal work of breathing Extremities: No peripheral edema.  Neuro: Cranial nerves grossly intact, ANO x 4  A/P: Plan per day team  -Monitor respiratory status  -Monitor CIWAs - Orders reviewed. Labs for AM ordered, which was adjusted as needed.  - If condition changes, plan includes ativan if needed.   , MD 12/10/2020, 8:08 PM PGY-3, [place] Night Resident  Please page [phone] with questions.

## 2020-12-10 NOTE — Progress Notes (Signed)
Physical Therapy Treatment Patient Details Name: Ernest Rodgers MRN: [id number] DOB: 1970/04/20 Today's Date: 12/10/2020    History of Present Illness 50 yo male presents to [facility] on 8/15 with L rib pain, epigastric pain radiating to back after jump into ball pit. CT chest shows acute pancreatitis with likely reactive changes to duodenum, hepatic steatosis, L 6-8th rib fx. on CIWA. On 8/18, pt with rapid response called due to tachycardia and hypoxia to 60s suspect due to HF vs ARDS, ETT 8/18-8/23 . CT abd shows small bowel obstruction on 8/21, OGT 8/21-8/23. Worsening agitation and hallucinations starting 8/23. s/p successful splenic artery embolization on 8/25. 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    PT Comments    Pt tolerated treatment well and was motivated to do well in session. Pt required decreased level of assist with transfers compared to previous sessions and progressed to stand pivot to recliner. Pt's HR and RR were increased during session (HR 125-134, RR 28-44). Continue to recommend CIR, as pt's deficits remain and pt fatigues quickly.    Follow Up Recommendations  CIR     Equipment Recommendations  Rolling walker with 5" wheels;3in1 (PT)    Recommendations for Other Services       Precautions / Restrictions Precautions Precautions: Fall Precaution Comments: Monitor HR Restrictions Weight Bearing Restrictions: No    Mobility  Bed Mobility Overal bed mobility: Needs Assistance Bed Mobility: Rolling;Sidelying to Sit Rolling: Min guard Sidelying to sit: Min assist;HOB elevated       General bed mobility comments: HOB 25 degrees with cues for sequence and increased time to roll right and rise from side    Transfers Overall transfer level: Needs assistance   Transfers: Sit to/from Stand Sit to Stand: Min assist         General transfer comment: min assist to stand  from bed x 5 trials with cues for hand placement and sequence increased difficulty with rising to full standing. Pivot to left with RW with min assist. Pt able to side step toward HOB x 3 steps with RW minguard. Pt fatigues quickly with standing HR 125-134 with standing trials  Ambulation/Gait             General Gait Details: not yet able   Stairs             Wheelchair Mobility    Modified Rankin (Stroke Patients Only)       Balance Overall balance assessment: Needs assistance Sitting-balance support: No upper extremity supported;Feet supported Sitting balance-Leahy Scale: Fair Sitting balance - Comments: EOB without assist     Standing balance-Leahy Scale: Poor Standing balance comment: bil UE support on RW                            Cognition Arousal/Alertness: Awake/alert Behavior During Therapy: WFL for tasks assessed/performed Overall Cognitive Status: Within Functional Limits for tasks assessed                                        Exercises General Exercises - Lower Extremity Long Arc Quad: AROM;Both;Seated;10 reps Hip Flexion/Marching: AROM;Both;10 reps;Seated    General Comments        Pertinent Vitals/Pain Pain Location: bil LE with standing Pain Descriptors / Indicators: Guarding;Aching Pain Intervention(s): Limited  activity within patient's tolerance;Monitored during session;Repositioned    Home Living Family/patient expects to be discharged to:: Private residence Living Arrangements: Spouse/significant other Available Help at Discharge: Family Type of Home: Mobile home Home Access: Stairs to enter            Prior Function            PT Goals (current goals can now be found in the care plan section) Progress towards PT goals: Progressing toward goals    Frequency    Min 3X/week      PT Plan Current plan remains appropriate    Co-evaluation              AM-PAC PT "6 Clicks"  Mobility   Outcome Measure  Help needed turning from your back to your side while in a flat bed without using bedrails?: A Little Help needed moving from lying on your back to sitting on the side of a flat bed without using bedrails?: A Little Help needed moving to and from a bed to a chair (including a wheelchair)?: A Little Help needed standing up from a chair using your arms (e.g., wheelchair or bedside chair)?: A Little Help needed to walk in hospital room?: Total Help needed climbing 3-5 steps with a railing? : Total 6 Click Score: 14    End of Session Equipment Utilized During Treatment: Gait belt Activity Tolerance: Patient tolerated treatment well Patient left: in chair;with call bell/phone within reach;with chair alarm set Nurse Communication: Mobility status PT Visit Diagnosis: Other abnormalities of gait and mobility (R26.89);Difficulty in walking, not elsewhere classified (R26.2)     Time: 2979-8921 PT Time Calculation (min) (ACUTE ONLY): 34 min  Charges:  $Therapeutic Exercise: 8-22 mins $Therapeutic Activity: 8-22 mins                     Velda Shell, SPT Acute Rehab: [phone]     Vance Gather 12/10/2020, 10:07 AM

## 2020-12-11 ENCOUNTER — Inpatient Hospital Stay (HOSPITAL_COMMUNITY): Payer: Managed Care, Other (non HMO)

## 2020-12-11 DIAGNOSIS — R5381 Other malaise: Secondary | ICD-10-CM | POA: Diagnosis not present

## 2020-12-11 DIAGNOSIS — F10231 Alcohol dependence with withdrawal delirium: Secondary | ICD-10-CM | POA: Diagnosis not present

## 2020-12-11 DIAGNOSIS — K8521 Alcohol induced acute pancreatitis with uninfected necrosis: Secondary | ICD-10-CM | POA: Diagnosis not present

## 2020-12-11 LAB — COMPREHENSIVE METABOLIC PANEL
ALT: 57 U/L — ABNORMAL HIGH (ref 0–44)
AST: 54 U/L — ABNORMAL HIGH (ref 15–41)
Albumin: 1.9 g/dL — ABNORMAL LOW (ref 3.5–5.0)
Alkaline Phosphatase: 125 U/L (ref 38–126)
Anion gap: 9 (ref 5–15)
BUN: 13 mg/dL (ref 6–20)
CO2: 21 mmol/L — ABNORMAL LOW (ref 22–32)
Calcium: 8.3 mg/dL — ABNORMAL LOW (ref 8.9–10.3)
Chloride: 107 mmol/L (ref 98–111)
Creatinine, Ser: 0.61 mg/dL (ref 0.61–1.24)
GFR, Estimated: >60 mL/min (ref >60)
Glucose, Bld: 150 mg/dL — ABNORMAL HIGH (ref 70–99)
Potassium: 3.8 mmol/L (ref 3.5–5.1)
Sodium: 137 mmol/L (ref 135–145)
Total Bilirubin: 1.2 mg/dL (ref 0.3–1.2)
Total Protein: 5.2 g/dL — ABNORMAL LOW (ref 6.5–8.1)

## 2020-12-11 LAB — CBC
HCT: 25.2 % — ABNORMAL LOW (ref 39.0–52.0)
Hemoglobin: 8.1 g/dL — ABNORMAL LOW (ref 13.0–17.0)
MCH: 34 pg (ref 26.0–34.0)
MCHC: 32.1 g/dL (ref 30.0–36.0)
MCV: 105.9 fL — ABNORMAL HIGH (ref 80.0–100.0)
Platelets: 281 10*3/uL (ref 150–400)
RBC: 2.38 MIL/uL — ABNORMAL LOW (ref 4.22–5.81)
RDW: 19.3 % — ABNORMAL HIGH (ref 11.5–15.5)
WBC: 9.6 10*3/uL (ref 4.0–10.5)
nRBC: 4.7 % — ABNORMAL HIGH (ref 0.0–0.2)

## 2020-12-11 LAB — PATHOLOGIST SMEAR REVIEW: Path Review: INCREASED

## 2020-12-11 LAB — GLUCOSE, CAPILLARY
Glucose-Capillary: 129 mg/dL — ABNORMAL HIGH (ref 70–99)
Glucose-Capillary: 136 mg/dL — ABNORMAL HIGH (ref 70–99)
Glucose-Capillary: 129 mg/dL — ABNORMAL HIGH (ref 70–99)
Glucose-Capillary: 110 mg/dL — ABNORMAL HIGH (ref 70–99)
Glucose-Capillary: 115 mg/dL — ABNORMAL HIGH (ref 70–99)

## 2020-12-11 MED ORDER — TAMSULOSIN HCL 0.4 MG PO CAPS
0.4000 mg | ORAL_CAPSULE | Freq: Once | ORAL | Status: AC
  Administered 2020-12-11: 0.4 mg via ORAL
  Filled 2020-12-11: qty 1

## 2020-12-11 MED ORDER — TAMSULOSIN HCL 0.4 MG PO CAPS
0.4000 mg | ORAL_CAPSULE | Freq: Every day | ORAL | Status: DC
  Administered 2020-12-12: 0.4 mg via ORAL
  Filled 2020-12-11: qty 1

## 2020-12-11 MED ORDER — FLUOXETINE HCL 20 MG PO CAPS
20.0000 mg | ORAL_CAPSULE | Freq: Every morning | ORAL | Status: DC
  Administered 2020-12-11 – 2020-12-13 (×3): 20 mg via ORAL
  Filled 2020-12-11 (×3): qty 1

## 2020-12-11 MED ORDER — OXYCODONE HCL 5 MG PO TABS
2.5000 mg | ORAL_TABLET | Freq: Once | ORAL | Status: AC
  Administered 2020-12-12: 2.5 mg via ORAL
  Filled 2020-12-11: qty 1

## 2020-12-11 MED ORDER — HEPARIN SODIUM (PORCINE) 5000 UNIT/ML IJ SOLN
5000.0000 [IU] | Freq: Three times a day (TID) | INTRAMUSCULAR | Status: DC
  Administered 2020-12-11 – 2020-12-12 (×3): 5000 [IU] via SUBCUTANEOUS
  Filled 2020-12-11 (×3): qty 1

## 2020-12-11 NOTE — Progress Notes (Signed)
PT Cancellation Note  Patient Details Name: Josearmando Kuhnert MRN: [id number] DOB: 1971-03-20   Cancelled Treatment:    Reason Eval/Treat Not Completed: Fatigue/lethargy limiting ability to participate (pt reports too fatigued to participate with plans to participate next attempt)   Ammar Moffatt B Shadavia Dampier 12/11/2020, 12:53 PM Merryl Hacker, PT Acute Rehabilitation Services Pager: [phone] Office: [phone]

## 2020-12-11 NOTE — Progress Notes (Signed)
PT Cancellation Note  Patient Details Name: Ernest Rodgers MRN: [id number] DOB: Aug 07, 1970   Cancelled Treatment:    Reason Eval/Treat Not Completed: Patient declined, no reason specified (pt reports being in chair for 2 hrs this am with several transfers to [facility] and currently too fatigued and SOB to participate. HR 114, SpO2 94% on RA)   Nalea Salce B Shanena Pellegrino 12/11/2020, 8:31 AM Merryl Hacker, PT Acute Rehabilitation Services Pager: [phone] Office: [phone]

## 2020-12-11 NOTE — Progress Notes (Signed)
Inpatient Rehabilitation Admissions Coordinator   I have received insurance approval to admit patient to CIR. I spoke with resident at [phone] and she states patient not medically ready to d/c to CIR today. I met with patient at bedside and he is aware. I will follow up tomorrow . Acute team and TOC made aware.  Danne Baxter, RN, MSN Rehab Admissions Coordinator [phone] 12/11/2020 11:43 AM

## 2020-12-11 NOTE — Progress Notes (Signed)
Patient transferred to 2C11 via chair . I have updated his wife

## 2020-12-11 NOTE — Progress Notes (Signed)
Report called to [facility] 2c11

## 2020-12-11 NOTE — H&P (Signed)
Physical Medicine and Rehabilitation Admission H&P    Chief Complaint  Patient presents with   Debility     HPI: Ernest Rodgers is a 50 year old male with history of HTN, renal calculi, gastric bypass, MDD, , ETOH abuse who was admitted on 11/26/20 wit abdominal pain and reports of fall 2 days PTA with rib pain. Work up revealed acute pancreatitis with left 6-8th rib fracture as well as hepatic steatosis. He was made NPO and treated with fluids and started on CIWA protocol. He was found unresponsive and hypoxic due to aspiration pneumonitis requiring intubation on 08/18. He was started on IV anbitiocs for staph aureus in sputum and had issues with agitation requiring precedex.   He continued to have fevers and antibiotics changed to Linezolid on 08/21.   He was started on tube feeds for nutritional support but developed abdominal distension due to distal SBO and Dr. Derrell Lolling recommended NGT to suction with serial X rays.   He was extubated on 08/23 but noted to have severe hallucination with agitation, fever as well as tachycardia overnight as well as drop in Hgb from 11-->9.2.  CTA chest was  negative for PE but he was found to have new large subcapsular splenic hematoma with hemoperitoneum and concerns of active bleeding, progression of pancreatitis with small peripancreatic pseudocyst and question of pancreatic necrosis and resolving BLL PNA.  IR consulted for input and recommended monitoring for 24 hours with serial CBC but patient continued to have issues with hypotension and progressive drop in H/H despite 1 unit PRBC. He underwent splenic artery embolization by Dr. Lowella Dandy on 08/25 and required 3 total units of PRBC.   He was started on TPN for nutritional support and advanced to clears on 08/26. He continued t Dr. Orvan Falconer consulted for input on pancreatitis and recommended supportive care with fluids, antibiotics and die advancement as tolerated.  Mentation has improved and he has been advanced  to regular diet. He developed severe hypoxia on 08/31 and repeat CTA chest was negative for PE and should stable BLL consolidation. He was started on low dose lasix but continues to have significant DOE with tachypnea with activity. Therapy ongoing and patient limited by weakness, DOE requiring multiple rest breaks with minimal activity.CIR recommended due to functional decline.     Review of Systems  Constitutional:  Negative for chills and fever.  HENT:  Negative for hearing loss and tinnitus.   Eyes:  Negative for blurred vision and double vision.  Respiratory:  Positive for shortness of breath.   Cardiovascular:  Positive for chest pain. Negative for leg swelling.  Gastrointestinal:  Negative for abdominal pain, diarrhea, heartburn and nausea.  Genitourinary:  Negative for dysuria.  Musculoskeletal:  Negative for back pain, joint pain and myalgias.  Skin:  Negative for rash.  Neurological:  Positive for weakness. Negative for dizziness and headaches.  All other systems reviewed and are negative.   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 From [facility] and wife currently working. He is a former smoker--quit 04/2009. He has never used smokeless tobacco. He drinks alcohol almost daily.    Allergies: No Known Allergies   Medications Prior to Admission  Medication Sig Dispense Refill   cyclobenzaprine (FLEXERIL) 10 MG tablet Take 1 tablet (10 mg total) by mouth 2 (two) times daily as needed for muscle spasms. (Patient taking differently: Take  10 mg by mouth every 12 (twelve) hours.) 20 tablet 0   FLUoxetine (PROZAC) 20 MG tablet Take 20 mg by mouth every morning.     furosemide (LASIX) 20 MG tablet Take 20 mg by mouth every morning.     lisinopril (ZESTRIL) 10 MG tablet Take 10 mg by mouth daily.     metoprolol succinate (TOPROL-XL) 50 MG 24 hr tablet Take 50 mg by mouth daily.     omeprazole (PRILOSEC) 20 MG capsule Take 20 mg by mouth daily.     ondansetron (ZOFRAN) 8 MG tablet Take 8 mg by mouth every 8 (eight) hours as needed for nausea or vomiting.     predniSONE (STERAPRED UNI-PAK 21 TAB) 10 MG (21) TBPK tablet Take by mouth daily. Take 6 tabs by mouth daily  for 2 days, then 5 tabs for 2 days, then 4 tabs for 2 days, then 3 tabs for 2 days, 2 tabs for 2 days, then 1 tab by mouth daily for 2 days 42 tablet 0    Drug Regimen Review  Drug regimen was reviewed and remains appropriate with no significant issues identified  Home: Home Living Family/patient expects to be discharged to:: Private residence Living Arrangements: Spouse/significant other Available Help at Discharge: Family, Available 24 hours/day (wife arranging 24/7 supervision) Type of Home: Other(Comment) (38 foot RV) Home Access: Stairs to enter Entrance Stairs-Number of Steps: 5 Home Layout: One level Bathroom Shower/Tub: Health visitor: Pharmacist, community: Yes Home Equipment: None  Lives With: Spouse   Functional History: Prior Function Level of Independence: Independent Comments: pt works as a travel Charity fundraiser at [employer]; wife works on 2[employer]  Functional Status:  Mobility: Bed Mobility Overal bed mobility: Needs Assistance Bed Mobility: Rolling, Sidelying to [employer]: Min guard Sidelying to sit: Min guard Supine to sit: Max assist, +2 for physical assistance Sit to supine: Max assist General bed mobility comments: Wife assisted with transfer to EOB Transfers Overall transfer level: Needs assistance Equipment used: Rolling  walker (2 wheeled) Transfers: Sit to/from Stand Sit to Stand: Min guard, Min assist Stand pivot transfers: Min assist General transfer comment: Min guard from bed for safety; min A and increased time from lower chair Ambulation/Gait Ambulation/Gait assistance: Min assist Gait Distance (Feet): 40 Feet (40'x2) Assistive device: Rolling walker (2 wheeled) Gait Pattern/deviations: Step-to pattern, Decreased stride length General Gait Details: Ambulated 40' x 2 but with DOE of 4/4 and requiring seated rest breaks. Min cues for controlled breathing and posture/RW proximity Gait velocity: decreased    ADL: ADL Overall ADL's : Needs assistance/impaired Grooming: Set up, Sitting Grooming Details (indicate cue type and reason): Mod A and cues for initiation/continuation to wash face seated on BSC. Upper Body Bathing: Sitting, Set up Upper Body Bathing Details (indicate cue type and reason): Able to wash chest, arms and abdomen with set-up assist. Lower Body Bathing: Maximal assistance Lower Body Bathing Details (indicate cue type and reason): Max A for time management. Patient limited by fatigue. Upper Body Dressing : Sitting, Set up Upper  Body Dressing Details (indicate cue type and reason): Donned anterior hospital gown seated EOB. Lower Body Dressing: Moderate assistance Lower Body Dressing Details (indicate cue type and reason): Able to doff/don bilateral footwear with increased time/effort in figure-4 position. Cues for rest breaks. Toilet Transfer: Moderate assistance, BSC, Stand-pivot, RW Toilet Transfer Details (indicate cue type and reason): assist to pivot to [facility]. Pt very SOB and fatigued. Toileting- Clothing Manipulation and Hygiene: Maximal assistance, Sit to/from stand Toileting - Clothing Manipulation Details (indicate cue type and reason): Supervision A with use of urinal. Functional mobility during ADLs: Moderate assistance, Rolling walker General ADL Comments: Patient continues  to be limited by generalized weakness, decreased activity tolerance and decreased balance  Cognition: Cognition Overall Cognitive Status: Within Functional Limits for tasks assessed Orientation Level: Oriented X4 Cognition Arousal/Alertness: Awake/alert Behavior During Therapy: WFL for tasks assessed/performed Overall Cognitive Status: Within Functional Limits for tasks assessed Area of Impairment: Orientation, Attention, Following commands, Memory, Safety/judgement, Problem solving Orientation Level: Disoriented to, Place, Time, Situation Current Attention Level: Focused Memory: Decreased short-term memory Following Commands: Follows one step commands inconsistently, Follows one step commands with increased time Safety/Judgement: Decreased awareness of safety, Decreased awareness of deficits Problem Solving: Slow processing, Decreased initiation, Difficulty sequencing, Requires verbal cues, Requires tactile cues General Comments: Cognition continues to improve; A&Ox4; follows 1-2 step verbal commands with good accuracy   Blood pressure (!) 136/92, pulse (!) 107, temperature 98 F (36.7 C), temperature source Oral, resp. rate 16, height  (1.778 m), weight 124.3 kg, SpO2 99 %. Physical Exam Vitals and nursing note reviewed. Exam conducted with a chaperone present.  Constitutional:      Appearance: He is well-developed. He is obese.     Comments: Obese male. NAD; sitting EOB, but his posture wilted after 5-10 minutes and laid down; wife at bedside.   HENT:     Head: Normocephalic and atraumatic.     Right Ear: External ear normal.     Left Ear: External ear normal.     Nose: Nose normal. No congestion.     Mouth/Throat:     Mouth: Mucous membranes are moist.     Pharynx: Oropharynx is clear. No pharyngeal swelling.     Comments: Scab on lower lip due to DTI from vent tubing. Fried blood Eyes:     General:        Right eye: No discharge.        Left eye: No discharge.      Pupils: Pupils are equal, round, and reactive to light.  Cardiovascular:     Rate and Rhythm: Normal rate and regular rhythm.     Heart sounds: Normal heart sounds. No murmur heard.   No gallop.  Pulmonary:     Comments: TTP over L lateral ribs and R lower posterior ribs Otherwise, CTA B/L- no W/R/R- good air movement   Abdominal:     General: Abdomen is protuberant.     Comments: Protuberant, soft, NT, ND, hypoactive  Musculoskeletal:     Cervical back: Normal range of motion. No rigidity.     Comments: UE- biceps 4+/5, triceps 4+/5, WE 5-/5, grip 5-/5, and FA 5-/5 B/L LE- HF 4/5, KE 4/5, KF 4/5, DF and PF 4+/5 B/L   Skin:    Comments: B/L forearm IV's- L doesn't work per pt- R OK- both look well Head shaved No skin breakdown on backside or feet  Neurological:     Mental Status: He is alert and oriented to person,  place, and time.     Comments: Verbose with rapid/slurred speech at times. Needs occasional redirection but able to follow commands without difficulty.  Intact to light touch in all 4 extremities   Psychiatric:        Mood and Affect: Mood normal.        Behavior: Behavior normal.     Comments: tangential    Results for orders placed or performed during the hospital encounter of 11/26/20 (from the past 48 hour(s))  Glucose, capillary     Status: Abnormal   Collection Time: 12/11/20  3:38 PM  Result Value Ref Range   Glucose-Capillary 110 (H) 70 - 99 mg/dL    Comment: Glucose reference range applies only to samples taken after fasting for at least 8 hours.  Glucose, capillary     Status: Abnormal   Collection Time: 12/11/20  9:37 PM  Result Value Ref Range   Glucose-Capillary 115 (H) 70 - 99 mg/dL    Comment: Glucose reference range applies only to samples taken after fasting for at least 8 hours.   Comment 1 Notify RN    Comment 2 Document in Chart   CBC     Status: Abnormal   Collection Time: 12/12/20 12:44 AM  Result Value Ref Range   WBC 8.7 4.0 - 10.5  K/uL   RBC 2.46 (L) 4.22 - 5.81 MIL/uL   Hemoglobin 8.7 (L) 13.0 - 17.0 g/dL   HCT 49.7 (L) 02.6 - 37.8 %   MCV 103.7 (H) 80.0 - 100.0 fL   MCH 35.4 (H) 26.0 - 34.0 pg   MCHC 34.1 30.0 - 36.0 g/dL   RDW 58.8 (H) 50.2 - 77.4 %   Platelets 259 150 - 400 K/uL   nRBC 3.7 (H) 0.0 - 0.2 %    Comment: Performed at [facility], [address] [place], [place] [address]  Comprehensive metabolic panel     Status: Abnormal   Collection Time: 12/12/20 12:44 AM  Result Value Ref Range   Sodium 136 135 - 145 mmol/L   Potassium 3.8 3.5 - 5.1 mmol/L   Chloride 108 98 - 111 mmol/L   CO2 18 (L) 22 - 32 mmol/L   Glucose, Bld 109 (H) 70 - 99 mg/dL    Comment: Glucose reference range applies only to samples taken after fasting for at least 8 hours.   BUN 12 6 - 20 mg/dL   Creatinine, Ser 6.76 0.61 - 1.24 mg/dL   Calcium 7.9 (L) 8.9 - 10.3 mg/dL   Total Protein 5.3 (L) 6.5 - 8.1 g/dL   Albumin 2.1 (L) 3.5 - 5.0 g/dL   AST 40 15 - 41 U/L   ALT 47 (H) 0 - 44 U/L   Alkaline Phosphatase 117 38 - 126 U/L   Total Bilirubin 1.5 (H) 0.3 - 1.2 mg/dL   GFR, Estimated >72 >09 mL/min    Comment: (NOTE) Calculated using the CKD-EPI Creatinine Equation (2021)    Anion gap 10 5 - 15    Comment: Performed at [facility], [address], [place], [place] [address]  Glucose, capillary     Status: Abnormal   Collection Time: 12/12/20  6:31 AM  Result Value Ref Range   Glucose-Capillary 120 (H) 70 - 99 mg/dL    Comment: Glucose reference range applies only to samples taken after fasting for at least 8 hours.   Comment 1 Notify RN    Comment 2 Document in Chart  Glucose, capillary     Status: Abnormal   Collection Time: 12/12/20  4:51 PM  Result Value Ref Range   Glucose-Capillary 112 (H) 70 - 99 mg/dL    Comment: Glucose reference range applies only to samples taken after fasting for at least 8 hours.  Glucose, capillary     Status: Abnormal   Collection Time: 12/12/20  9:30 PM  Result Value  Ref Range   Glucose-Capillary 123 (H) 70 - 99 mg/dL    Comment: Glucose reference range applies only to samples taken after fasting for at least 8 hours.   Comment 1 Notify RN    Comment 2 Document in Chart   Glucose, capillary     Status: Abnormal   Collection Time: 12/13/20  6:20 AM  Result Value Ref Range   Glucose-Capillary 123 (H) 70 - 99 mg/dL    Comment: Glucose reference range applies only to samples taken after fasting for at least 8 hours.   Comment 1 Notify RN    Comment 2 Document in Chart   CBC     Status: Abnormal   Collection Time: 12/13/20  6:29 AM  Result Value Ref Range   WBC 8.0 4.0 - 10.5 K/uL   RBC 2.45 (L) 4.22 - 5.81 MIL/uL   Hemoglobin 8.2 (L) 13.0 - 17.0 g/dL   HCT 18.8 (L) 41.6 - 60.6 %   MCV 106.5 (H) 80.0 - 100.0 fL   MCH 33.5 26.0 - 34.0 pg   MCHC 31.4 30.0 - 36.0 g/dL   RDW 30.1 (H) 60.1 - 09.3 %   Platelets 249 150 - 400 K/uL   nRBC 0.8 (H) 0.0 - 0.2 %    Comment: Performed at [facility], [address], [place], [place] [address]   CT Angio Chest Pulmonary Embolism (PE) W or WO Contrast  Result Date: 12/12/2020 CLINICAL DATA:  Shortness of breath EXAM: CT ANGIOGRAPHY CHEST WITH CONTRAST TECHNIQUE: Multidetector CT imaging of the chest was performed using the standard protocol during bolus administration of intravenous contrast. Multiplanar CT image reconstructions and MIPs were obtained to evaluate the vascular anatomy. CONTRAST:  68mL OMNIPAQUE IOHEXOL 350 MG/ML SOLN COMPARISON:  CT from the previous day. FINDINGS: Cardiovascular: Thoracic aorta demonstrates atherosclerotic calcifications. The ascending aorta is mildly prominent at the upper limits of normal in size measuring 3.9 cm. Normal tapering in the aortic arch is noted. No cardiac enlargement is seen. The pulmonary artery shows a normal branching pattern bilaterally. No definitive filling defect to suggest pulmonary embolism is noted. Mediastinum/Nodes: Thoracic inlet is within normal  limits. No sizable hilar or mediastinal adenopathy is noted. The esophagus as visualized is within normal limits. Lungs/Pleura: Lungs again demonstrate bilateral lower lobe consolidation with small left pleural effusion. The overall appearance is similar to that seen on the prior exam. No pneumothorax is identified. No sizable parenchymal nodule is seen. Upper Abdomen: Postsurgical changes are noted in the stomach. Prominence of the spleen with subcapsular hematoma is again identified. Musculoskeletal: Degenerative changes of the thoracic spine are noted. Review of the MIP images confirms the above findings. IMPRESSION: No evidence of pulmonary emboli. Bilateral lower lobe consolidation with small left pleural effusion stable from the prior exam. Changes consistent with the known subcapsular splenic hematoma. Aortic Atherosclerosis (ICD10-I70.0). Electronically Signed   By: Alcide Clever M.D.   On: 12/12/2020 11:09       Medical Problem List and Rodgers: 1.  Debility secondary to [facility] hospital stay from pancreatitis/intubation, splenic hematoma s/p  embolization; and flash pulm edema  -patient may  shower  -ELOS/Goals: 10-14 days- supervision 2.  Antithrombotics: -DVT/anticoagulation:  Pharmaceutical: Lovenox  -antiplatelet therapy: N/A 3. Pain Management: Tylenol 650 mg qid --Oxycodone prn with close monitoring of GI symptoms 4. Mood: LCSW to follow for evalaution and support.   -antipsychotic agents: N/A 5. Neuropsych: This patient may be intermittently capable of making decisions on his own behalf. 6. Skin/Wound Care: Routine pressure relief measures.  7. Fluids/Electrolytes/Nutrition: Monitor I/O. Check CMET in am.   --continue Ensure TID--will change to Ensure Max due to hyperglycemia (A1c- -5.5) 8. DOE: Encourage pulmonary toilet.  Question fluid overload--wt up by average of 15 lbs?   --Monitor for signs of overload and check daily standing weights   9. SBO/Ileus: Has resolved. Augment  bowel program as indicated  --Keep K>4.0 and Mg>2.0 to prevent recurrence  --add Kdur for supplement as on lasix and K<4.0 10. HTN/Tachycardia: Monitor HR TID--on Toprol XL, Lisinopril and Furosemide.  11. Abnormal LFTs:Resolving--recheck in am 12. Acute blood loss anemia: Continue to monitor--especially with lovenox on board  --stable in 7-8 range.  13. ETOH abuse/Hepatic steatosis: On Thiamine and Folic acid.  14. Nose bleed: Monitor for recurrence.      Ernest Creeamela S Love, PA-C 12/13/2020   I have personally performed a face to face diagnostic evaluation of this patient and formulated the key components of the Rodgers.  Additionally, I have personally reviewed laboratory data, imaging studies, as well as relevant notes and concur with the physician assistant's documentation above.   The patient's status has not changed from the original H&P.  Any changes in documentation from the acute care chart have been noted above.

## 2020-12-11 NOTE — Progress Notes (Signed)
FPTS Brief Progress Note  S Saw patient at bedside this evening. Patient was resting comfortably. No concerns from patient or RN who was at bedside.   O: BP 121/80 (BP Location: Left Arm)   Pulse 97   Temp (!) 97.4 F (36.3 C) (Oral)   Resp (!) 25   Ht 5\' 10"  (1.778 m)   Wt 124.3 kg   SpO2 99%   BMI 39.32 kg/m    General: Alert, no acute distress, well appearing  A/P: Plan per day team  -Orders reviewed. Labs for AM ordered, which was adjusted as needed.  -If patient becomes hypoxic or spikes fevers: CTA, blood cultures and antibiotics   , MD 12/11/2020, 8:02 PM PGY-3, [facility] Night Resident  Please page [phone] with questions.

## 2020-12-11 NOTE — PMR Pre-admission (Signed)
PMR Admission Coordinator Pre-Admission Assessment  Patient: Ernest Rodgers is an 50 y.o., male MRN: [id number] DOB: May 26, 1970 Height: 5' 10"  (177.8 cm) Weight: 124.3 kg  Insurance Information HMO: yes    PPO:      PCP:      IPA:      80/20:      OTHER:  PRIMARY: [employer]      Policy#: [id number]      Subscriber: pt CM Name: Butch Penny      Phone#: [phone] ext [phone]     Fax#: [phone] Pre-Cert#: [id number] approved for 7 days with f/u with [facility] phone [phone] ext [phone] fax [phone] updates due 9/6      Employer: Travel Nurse Benefits:  Phone #: [phone]     Name: 8/29 Eff. Date: 04/14/2020     Deduct: $1500      Out of Pocket Max: $6000      Life Max: none CIR: 80%      SNF: 80% Outpatient: $25 co pay per visit     Co-Pay: 15 visits combined Home Health: 80%      Co-Pay: 60 visits combined DME: 80%     Co-Pay: 20% Providers: in network  SECONDARY: none  Financial Counselor:       Phone#:   [employer]" for patients in Inpatient Rehabilitation Facilities with attached "Privacy Act [place] Records" was provided and verbally reviewed with: N/A  Emergency Contact Information Contact Information     Name Relation Home Work Mobile   Butrum,AJ Spouse   [phone]   Cartee,Shannon Daughter   [phone]       Current Medical History  Patient Admitting Diagnosis: acute pancreatitis, Debility  History of Present Illness: 50 year old male with history of GERD, HTN, MDD, ETOH use disorder, anxiety, depression and class 2 obesity s/p gastric bypass.    Presented 11/26/20 with epigastric pain radiating to the back. Was seen in Urgent care two days pta for rib pain after falling into a ball pit. He was prescribed prednisone taper, cyclobenzaprine for muscle spasms and given IM decadron.  Given Dilaudid, Zofran and Norma saline bolus in ER. CT scan of the abdomen showed pancreatic necrosis possible evidence of pancreatitis. GI  consulted . Given aggressive hydration, Dilaudid for pain, and Zofran for Nausea and vomiting. Supportive care in th  ICU. Patient did experience delirium and hallucinations in the ICU felt multifactorial. He received Ativan for withdrawal. Evaluated by Rapid Response on 8/18 when unresponsive to painful stimuli and suspected encephalopathy due to worsening alcohol withdrawal . Flash pulmonary edema was a complication of his pancreatitis and underlying diastolic dysfunction with an element of hepatic congestion . CCM consulted and transferred to ICU. Subsequently intubated 11/29/20 until 12/03/2020. During intubation found to have SBO. ICU course complicated by development of aspiration PNA and the SBO. Surgery evaluated and decided he had adequate bowel movements with no need for surgical intervention. Also found to have splenic hematoma and had embolization of the spleen via IR on 8/25. Hgb has been stable. He received meningococcal and Hib vaccines in hospital . Still needs Shingrix and PCV 20 as an outpatient. MRSA on sputum culture. Completed course of Linezolid and CTX. ON room air.   Patient's [facility] has been reviewed by the rehabilitation admissions coordinator and physician.   Past Medical History  Past Medical History:  Diagnosis Date   Hypertension    Kidney stone    Has the patient had major  surgery during 100 days prior to admission? No  Family History   family history is not on file.  Current Medications  Current Facility-Administered Medications:    0.9 %  sodium chloride infusion, 250 mL, Intravenous, Continuous, Maudie Mercury, MD, Last Rate: 10 mL/hr at 12/08/20 2200, Infusion Verify at 12/08/20 2200   acetaminophen (TYLENOL) tablet 650 mg, 650 mg, Oral, Q6H, Maxwell, Allee, MD, 650 mg at 12/13/20 1016   ascorbic acid (VITAMIN C) tablet 1,000 mg, 1,000 mg, Oral, Daily, Paytes, Austin A, RPH, 1,000 mg at 12/13/20 1016   chlorhexidine (PERIDEX)  0.12 % solution 15 mL, 15 mL, Mouth Rinse, BID, Olalere, Adewale A, MD, 15 mL at 12/13/20 1017   Chlorhexidine Gluconate Cloth 2 % PADS 6 each, 6 each, Topical, Daily, Agarwala, Ravi, MD, 6 each at 12/13/20 1146   enoxaparin (LOVENOX) injection 60 mg, 60 mg, Subcutaneous, Q24H, Espinoza, Alejandra, DO   feeding supplement (ENSURE ENLIVE / ENSURE PLUS) liquid 237 mL, 237 mL, Oral, TID BM, Martyn Malay, MD, 237 mL at 12/13/20 1131   FLUoxetine (PROZAC) capsule 20 mg, 20 mg, Oral, q morning, Simmons-Robinson, Makiera, MD, 20 mg at 11/91/47 8295   folic acid (FOLVITE) tablet 1 mg, 1 mg, Oral, Daily, Brewington, Eden E, RPH, 1 mg at 12/13/20 1016   furosemide (LASIX) tablet 20 mg, 20 mg, Oral, q morning, Espinoza, Alejandra, DO, 20 mg at 12/13/20 1016   ipratropium-albuterol (DUONEB) 0.5-2.5 (3) MG/3ML nebulizer solution 3 mL, 3 mL, Nebulization, Q4H PRN, Bowser, Grace E, NP, 3 mL at 12/11/20 0821   lidocaine (LIDODERM) 5 % 1 patch, 1 patch, Transdermal, Daily PRN, Nita Sells, Alejandra, DO, 1 patch at 12/11/20 2055   lisinopril (ZESTRIL) tablet 10 mg, 10 mg, Oral, Daily, Espinoza, Alejandra, DO, 10 mg at 12/13/20 1016   MEDLINE mouth rinse, 15 mL, Mouth Rinse, q12n4p, Olalere, Adewale A, MD, 15 mL at 12/11/20 1222   metoprolol succinate (TOPROL-XL) 24 hr tablet 50 mg, 50 mg, Oral, Daily, Martyn Malay, MD, 50 mg at 12/13/20 1130   oxyCODONE (Oxy IR/ROXICODONE) immediate release tablet 5 mg, 5 mg, Oral, Q6H PRN, Zigmund Daniel, Allee, MD, 5 mg at 12/13/20 1016   pantoprazole (PROTONIX) EC tablet 40 mg, 40 mg, Oral, Daily, Henri Medal, RPH, 40 mg at 12/13/20 1016   polyethylene glycol (MIRALAX / GLYCOLAX) packet 17 g, 17 g, Oral, Daily PRN, Nita Sells, Alejandra, DO   ramelteon (ROZEREM) tablet 8 mg, 8 mg, Oral, QHS, Espinoza, Alejandra, DO, 8 mg at 12/12/20 2208   senna (SENOKOT) tablet 8.6 mg, 1 tablet, Oral, Daily PRN, Espinoza, Alejandra, DO   sodium chloride flush (NS) 0.9 % injection 10-40 mL, 10-40 mL,  Intracatheter, Q12H, Olalere, Adewale A, MD, 10 mL at 12/13/20 1146   sodium chloride flush (NS) 0.9 % injection 10-40 mL, 10-40 mL, Intracatheter, PRN, Olalere, Adewale A, MD, 10 mL at 12/07/20 2000   tamsulosin (FLOMAX) capsule 0.4 mg, 0.4 mg, Oral, QPC supper, Dorris Singh M, MD, 0.4 mg at 12/12/20 1622   thiamine tablet 100 mg, 100 mg, Oral, Daily, Brewington, Eden E, RPH, 100 mg at 12/13/20 1017   vitamin A capsule 20,000 Units, 20,000 Units, Oral, Daily, Paytes, Austin A, RPH, 20,000 Units at 12/13/20 1016   white petrolatum (VASELINE) gel, , Topical, PRN, Mannam, Praveen, MD, 0.2 application at 62/13/08 1017   zinc sulfate capsule 220 mg, 220 mg, Oral, Daily, Brewington, Eden E, RPH, 220 mg at 12/13/20 1017  Patients Current Diet:  Diet Order  Diet regular Room service appropriate? Yes; Fluid consistency: Thin  Diet effective now                  Precautions / Restrictions Precautions Precautions: Fall Precaution Comments: Monitor HR Restrictions Weight Bearing Restrictions: No   Has the patient had 2 or more falls or a fall with injury in the past year? 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 Independent  Indoor Mobility: Did the patient need assistance with walking from room to room (with or without device)? Independent  Stairs: Did the patient need assistance with internal or external stairs (with or without device)? Independent  Functional Cognition: Did the patient need help planning regular tasks such as shopping or remembering to take medications? Independent  Patient Information Are you of Hispanic, Latino/a,or Spanish origin?: A. No, not of Hispanic, Latino/a, or Spanish origin What is your race?: A. White Do you need or want an interpreter to communicate with a doctor or health care staff?: 0.  No  Patient's Response To:  Health Literacy and Transportation Is the patient able to respond to health literacy and transportation needs?: Yes Health Literacy - How often do you need to have someone help you when you read instructions, pamphlets, or other written material from your doctor or pharmacy?: Never In the past 12 months, has lack of transportation kept you from medical appointments or from getting medications?: No In the past 12 months, has lack of transportation kept you from meetings, work, or from getting things needed for daily living?: No  Development worker, international aid / [place] Devices/Equipment: Eyeglasses Home Equipment: None  Prior Device Use: Indicate devices/aids used by the patient prior to current illness, exacerbation or injury? None of the above  Current Functional Level Cognition  Overall Cognitive Status: Within Functional Limits for tasks assessed Current Attention Level: Focused Orientation Level: Oriented X4 Following Commands: Follows one step commands inconsistently, Follows one step commands with increased time Safety/Judgement: Decreased awareness of safety, Decreased awareness of deficits General Comments: Cognition continues to improve; A&Ox4; follows 1-2 step verbal commands with good accuracy    Extremity Assessment (includes Sensation/Coordination)  Upper Extremity Assessment: Overall WFL for tasks assessed RUE Deficits / Details: Unable to maintain position of UE in space against gravity, AROM WFL at elbow, wrist and digits LUE Deficits / Details: Unable to maintain position of UE in space against gravity, AROM WFL at elbow, wrist and digits  Lower Extremity Assessment: Defer to PT evaluation RLE Deficits / Details: 2+/5 knee extension bilat; 2/5 hip abd, hip flex, DF/PF (R groin incision from procedure yesterday) RLE: Unable to fully assess due to pain LLE Deficits / Details: 2+/5 knee extension bilat; 2/5 hip abd, hip flex, DF/PF     ADLs  Overall ADL's : Needs assistance/impaired Grooming: Set up, Sitting Grooming Details (indicate cue type and reason): Mod A and cues for initiation/continuation to wash face seated on BSC. Upper Body Bathing: Sitting, Set up Upper Body Bathing Details (indicate cue type and reason): Able to wash chest, arms and abdomen with set-up assist. Lower Body Bathing: Maximal assistance Lower Body Bathing Details (indicate cue type and reason): Max A for time management. Patient limited by fatigue. Upper Body Dressing : Sitting, Set up Upper Body Dressing Details (indicate cue type and reason): Donned anterior hospital gown seated EOB. Lower Body Dressing: Moderate assistance  Lower Body Dressing Details (indicate cue type and reason): Able to doff/don bilateral footwear with increased time/effort in figure-4 position. Cues for rest breaks. Toilet Transfer: Moderate assistance, BSC, Stand-pivot, RW Toilet Transfer Details (indicate cue type and reason): assist to pivot to [facility]. Pt very SOB and fatigued. Toileting- Clothing Manipulation and Hygiene: Maximal assistance, Sit to/from stand Toileting - Clothing Manipulation Details (indicate cue type and reason): Supervision A with use of urinal. Functional mobility during ADLs: Moderate assistance, Rolling walker General ADL Comments: Patient continues to be limited by generalized weakness, decreased activity tolerance and decreased balance    Mobility  Overal bed mobility: Needs Assistance Bed Mobility: Rolling, Sidelying to Sit Rolling: Min guard Sidelying to sit: Min guard Supine to sit: Max assist, +2 for physical assistance Sit to supine: Max assist General bed mobility comments: Wife assisted with transfer to EOB    Transfers  Overall transfer level: Needs assistance Equipment used: Rolling walker (2 wheeled) Transfers: Sit to/from Stand Sit to Stand: Min guard, Min assist Stand pivot transfers: Min assist General transfer comment: Min  guard from bed for safety; min A and increased time from lower chair    Ambulation / Gait / Stairs / Wheelchair Mobility  Ambulation/Gait Ambulation/Gait assistance: Herbalist (Feet): 40 Feet (40'x2) Assistive device: Rolling walker (2 wheeled) Gait Pattern/deviations: Step-to pattern, Decreased stride length General Gait Details: Ambulated 40' x 2 but with DOE of 4/4 and requiring seated rest breaks. Min cues for controlled breathing and posture/RW proximity Gait velocity: decreased    Posture / Balance Dynamic Sitting Balance Sitting balance - Comments: Pt donning shorts at EOB Balance Overall balance assessment: Needs assistance Sitting-balance support: No upper extremity supported, Feet supported Sitting balance-Leahy Scale: Good Sitting balance - Comments: Pt donning shorts at EOB Standing balance-Leahy Scale: Poor Standing balance comment: Reliant on BUE support on RW.    Special needs/care consideration CIWA precautions Contact precautions   Previous Home Environment  Living Arrangements: Spouse/significant other  Lives With: Spouse Available Help at Discharge: Family, Available 24 hours/day (wife arranging 24/7 supervision) Type of Home: Other(Comment) (38 foot RV) Home Layout: One level Home Access: Stairs to enter [employer] of Steps: 5 Bathroom Shower/Tub: Multimedia programmer: Standard Bathroom Accessibility: Yes How Accessible: Accessible via walker [place]: No  Discharge Living Setting Plans for Discharge Living Setting: Patient's home, Other (Comment) (38 foot RV trailer) Type of Home at Discharge: Other (Comment) Discharge Home Layout: One level Discharge Home Access: Stairs to enter Entrance Stairs-Number of Steps: 5 Discharge Bathroom Shower/Tub: Walk-in shower Discharge Bathroom Toilet: Standard Discharge Bathroom Accessibility: Yes How Accessible: Accessible via walker Does the patient have any  problems obtaining your medications?: No  Social/Family/Support Systems Patient Roles: Spouse, Parent (Travel RN) Contact Information: wife, AJ Anticipated Caregiver: wife and family Anticipated Caregiver's Contact Information: see above Ability/Limitations of Caregiver: wife works [employer] as Emergency planning/management officer Availability: 24/7 Discharge Plan Discussed with Primary Caregiver: Yes Is Caregiver In Agreement with Plan?: Yes Does Caregiver/Family have Issues with Lodging/Transportation while Pt is in Rehab?: No  Goals Patient/Family Goal for Rehab: Mod I to supervision with PT and OT Expected length of stay: ELOS 10 to 12 days Pt/Family Agrees to Admission and willing to participate: Yes Program Orientation Provided & Reviewed with Pt/Caregiver Including Roles  & Responsibilities: Yes  Decrease burden of Care through IP rehab admission: n/a  Possible need for SNF placement upon discharge: not anticipated  Patient Condition: I have reviewed medical records  from [facility] , spoken with CM, and patient and spouse. I met with patient at the bedside for inpatient rehabilitation assessment.  Patient will benefit from ongoing PT and OT, can actively participate in 3 hours of therapy a day 5 days of the week, and can make measurable gains during the admission.  Patient will also benefit from the coordinated team approach during an Inpatient Acute Rehabilitation admission.  The patient will receive intensive therapy as well as Rehabilitation physician, nursing, social worker, and care management interventions.  Due to bladder management, bowel management, safety, skin/wound care, disease management, medication administration, pain management, and patient education the patient requires 24 hour a day rehabilitation nursing.  The patient is currently min assist overall with mobility and basic ADLs.  Discharge setting and therapy post discharge at home with outpatient is anticipated.  Patient has  agreed to participate in the Acute Inpatient Rehabilitation Program and will admit today.  Preadmission Screen Completed By:  Cleatrice Burke, 12/13/2020 12:20 PM ______________________________________________________________________   Discussed status with Dr. Dagoberto Ligas on  12/13/2020 at 1221 and received approval for admission today.  Admission Coordinator:  Cleatrice Burke, RN, time  1221 Date  12/13/2020   Assessment/Plan: Diagnosis: Does the need for close, 24 hr/day Medical supervision in concert with the patient's rehab needs make it unreasonable for this patient to be served in a less intensive setting? Yes Co-Morbidities requiring supervision/potential complications: gastric bypass, pancreatitis; s/p CIWA, encephalopathy, SBO and asp pneumonia off ABX- MRSA precautions Due to bladder management, bowel management, safety, skin/wound care, disease management, medication administration, pain management, and patient education, does the patient require 24 hr/day rehab nursing? Yes Does the patient require coordinated care of a physician, rehab nurse, PT, OT, and SLP to address physical and functional deficits in the context of the above medical diagnosis(es)? Yes Addressing deficits in the following areas: balance, endurance, locomotion, strength, transferring, bathing, dressing, feeding, grooming, toileting, and cognition Can the patient actively participate in an intensive therapy program of at least 3 hrs of therapy 5 days a week? Yes The potential for patient to make measurable gains while on inpatient rehab is good Anticipated functional outcomes upon discharge from inpatient rehab: modified independent and supervision PT, modified independent and supervision OT, n/a SLP Estimated rehab length of stay to reach the above functional goals is: 10-12 days Anticipated discharge destination: Home 10. Overall Rehab/Functional Prognosis: good   MD Signature:

## 2020-12-11 NOTE — Progress Notes (Signed)
PT CIWA of 2 without medicine required. Lidocaine patch applied to left side of ribcage, prn oxycodone due later in shift. No other needs voiced at this time. Lanae Crumbly 12/11/20 9:20 PM

## 2020-12-11 NOTE — Progress Notes (Signed)
PICC removed per order, by policy, no complications noted.

## 2020-12-12 ENCOUNTER — Inpatient Hospital Stay (HOSPITAL_COMMUNITY): Payer: Managed Care, Other (non HMO)

## 2020-12-12 DIAGNOSIS — R0682 Tachypnea, not elsewhere classified: Secondary | ICD-10-CM

## 2020-12-12 LAB — GLUCOSE, CAPILLARY
Glucose-Capillary: 120 mg/dL — ABNORMAL HIGH (ref 70–99)
Glucose-Capillary: 112 mg/dL — ABNORMAL HIGH (ref 70–99)
Glucose-Capillary: 123 mg/dL — ABNORMAL HIGH (ref 70–99)

## 2020-12-12 LAB — COMPREHENSIVE METABOLIC PANEL
ALT: 47 U/L — ABNORMAL HIGH (ref 0–44)
AST: 40 U/L (ref 15–41)
Albumin: 2.1 g/dL — ABNORMAL LOW (ref 3.5–5.0)
Alkaline Phosphatase: 117 U/L (ref 38–126)
Anion gap: 10 (ref 5–15)
BUN: 12 mg/dL (ref 6–20)
CO2: 18 mmol/L — ABNORMAL LOW (ref 22–32)
Calcium: 7.9 mg/dL — ABNORMAL LOW (ref 8.9–10.3)
Chloride: 108 mmol/L (ref 98–111)
Creatinine, Ser: 0.62 mg/dL (ref 0.61–1.24)
GFR, Estimated: >60 mL/min (ref >60)
Glucose, Bld: 109 mg/dL — ABNORMAL HIGH (ref 70–99)
Potassium: 3.8 mmol/L (ref 3.5–5.1)
Sodium: 136 mmol/L (ref 135–145)
Total Bilirubin: 1.5 mg/dL — ABNORMAL HIGH (ref 0.3–1.2)
Total Protein: 5.3 g/dL — ABNORMAL LOW (ref 6.5–8.1)

## 2020-12-12 LAB — CBC
HCT: 25.5 % — ABNORMAL LOW (ref 39.0–52.0)
Hemoglobin: 8.7 g/dL — ABNORMAL LOW (ref 13.0–17.0)
MCH: 35.4 pg — ABNORMAL HIGH (ref 26.0–34.0)
MCHC: 34.1 g/dL (ref 30.0–36.0)
MCV: 103.7 fL — ABNORMAL HIGH (ref 80.0–100.0)
Platelets: 259 10*3/uL (ref 150–400)
RBC: 2.46 MIL/uL — ABNORMAL LOW (ref 4.22–5.81)
RDW: 19 % — ABNORMAL HIGH (ref 11.5–15.5)
WBC: 8.7 10*3/uL (ref 4.0–10.5)
nRBC: 3.7 % — ABNORMAL HIGH (ref 0.0–0.2)

## 2020-12-12 MED ORDER — SENNA 8.6 MG PO TABS
1.0000 | ORAL_TABLET | Freq: Every day | ORAL | Status: DC
  Administered 2020-12-12: 8.6 mg via ORAL
  Filled 2020-12-12: qty 1

## 2020-12-12 MED ORDER — FUROSEMIDE 20 MG PO TABS
20.0000 mg | ORAL_TABLET | Freq: Every morning | ORAL | Status: DC
  Administered 2020-12-12 – 2020-12-13 (×2): 20 mg via ORAL
  Filled 2020-12-12 (×2): qty 1

## 2020-12-12 MED ORDER — OXYCODONE HCL 5 MG PO TABS
2.5000 mg | ORAL_TABLET | Freq: Four times a day (QID) | ORAL | Status: DC | PRN
  Administered 2020-12-12: 2.5 mg via ORAL

## 2020-12-12 MED ORDER — MENINGOCOCCAL A C Y&W-135 OLIG IM SOLR
0.5000 mL | Freq: Once | INTRAMUSCULAR | Status: AC
  Administered 2020-12-12: 0.5 mL via INTRAMUSCULAR
  Filled 2020-12-12 (×5): qty 0.5

## 2020-12-12 MED ORDER — ENOXAPARIN SODIUM 60 MG/0.6ML IJ SOSY
60.0000 mg | PREFILLED_SYRINGE | INTRAMUSCULAR | Status: DC
  Filled 2020-12-12: qty 0.6

## 2020-12-12 MED ORDER — HAEMOPHILUS B POLYSAC CONJ VAC 10 MCG IJ SOLR
0.5000 mL | Freq: Once | INTRAMUSCULAR | Status: AC
  Administered 2020-12-12: 0.5 mL via INTRAMUSCULAR
  Filled 2020-12-12: qty 0.5

## 2020-12-12 MED ORDER — ZOSTER VAC RECOMB ADJUVANTED 50 MCG/0.5ML IM SUSR
0.5000 mL | Freq: Once | INTRAMUSCULAR | Status: DC
  Filled 2020-12-12: qty 0.5

## 2020-12-12 MED ORDER — IOHEXOL 350 MG/ML SOLN
50.0000 mL | Freq: Once | INTRAVENOUS | Status: AC | PRN
  Administered 2020-12-12: 50 mL via INTRAVENOUS

## 2020-12-12 MED ORDER — OXYCODONE HCL 5 MG PO TABS
5.0000 mg | ORAL_TABLET | Freq: Four times a day (QID) | ORAL | Status: DC | PRN
  Administered 2020-12-12 – 2020-12-13 (×3): 5 mg via ORAL
  Filled 2020-12-12 (×3): qty 1

## 2020-12-12 MED ORDER — HAEMOPHILUS B POLYSAC CONJ VAC 10 MCG IJ SOLR
0.5000 mL | Freq: Once | INTRAMUSCULAR | Status: DC
  Filled 2020-12-12 (×2): qty 0.5

## 2020-12-12 MED ORDER — POLYETHYLENE GLYCOL 3350 17 G PO PACK
17.0000 g | PACK | Freq: Every day | ORAL | Status: DC
  Administered 2020-12-12: 17 g via ORAL
  Filled 2020-12-12: qty 1

## 2020-12-12 MED ORDER — METOPROLOL SUCCINATE ER 50 MG PO TB24
50.0000 mg | ORAL_TABLET | Freq: Every day | ORAL | Status: DC
  Administered 2020-12-13: 50 mg via ORAL
  Filled 2020-12-12: qty 1

## 2020-12-12 NOTE — Discharge Instructions (Addendum)
Thank you for letting us care for you during your stay. It was a pleasure to meet you!  You were admitted to the [facility] Medicine Teaching Service.   You were admitted for pancreatitis and had fluid in the lungs that caused you to go to the ICU.  We found the following during your stay: pancreatitis, pneumonia, bowel obstruction, spleen hematoma.   We recommend follow up specifically for alcohol use disorder, splenic hematoma-to receive necessary vaccines such as the shingrex and PCV20.   Please follow up with your primary care physician in 1 week.  Please follow up with your PCP for discussing naltrexone versus acamprosate for help with alcohol cravings. I have provided some information on naltrexone, and there are resources through [employer] for acamprosate as well.  Follow up for the vaccines you need because your spleen is not working well. You still need PCV20 (pneumococcal vaccine) and shingrex.   If your symptoms worsen or return, please return to the hospital.  If you notice severe abdominal pain, nausea, vomiting, weakness, lightheadedness, dizziness, chest pain, severe pain, shortness of breath please come back to the hospital.   Please let us know if you have questions about your stay at [facility].

## 2020-12-12 NOTE — Progress Notes (Addendum)
Inpatient Rehabilitation Admissions Coordinator  Notified by Dr Zigmund Daniel that patient is not felt medically ready for discharge to CIR today. I will continue to follow and await medical readiness. I received Novella Rob on 8/30 and may have to re Auth with insurance.  Danne Baxter, RN, MSN Rehab Admissions Coordinator [phone] 12/12/2020 9:36 AM  Met with patient and his wife at bedside. Noted CT results. He is asking for adjustment to his metoprolol due to his dosage at home. I have alerted acute team. I await medical readiness to admit to CIR.  Danne Baxter, RN, MSN Rehab Admissions Coordinator [phone] 12/12/2020 11:29 AM

## 2020-12-12 NOTE — Progress Notes (Signed)
Occupational Therapy Treatment Patient Details Name: Ernest Rodgers MRN: [id number] DOB: 03/30/1971 Today's Date: 12/12/2020    History of present illness 50 yo male presents to [facility] on 8/15 with L rib pain, epigastric pain radiating to back after jump into ball pit. CT chest shows acute pancreatitis with likely reactive changes to duodenum, hepatic steatosis, L 6-8th rib fx. on CIWA. On 8/18, pt with rapid response called due to tachycardia and hypoxia to 60s suspect due to HF vs ARDS, ETT 8/18-8/23 . CT abd shows small bowel obstruction on 8/21, OGT 8/21-8/23. Worsening agitation and hallucinations starting 8/23. s/p successful splenic artery embolization on 8/25. 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   OT comments  Patient met lying supine in bed. 8/10 pain at rest in L flank. Patient reports receiving pain meds earlier this a.m. Sheets saturated with focus of session on UB/LB bathing/dressing seated EOB with Min to Max A grossly. Patient limited by decreased activity tolerance, decreased cardiopulmonary status (HR 118-124 with bathing at EOB and RR 18-34), and generalized weakness. Patient continues to be very motivated. Hopeful for transfer to CIR in the coming days. OT will continue to follow acutely.    Follow Up Recommendations  CIR    Equipment Recommendations  Other (comment) (Defer to next level of care)    Recommendations for Other Services      Precautions / Restrictions Precautions Precautions: Fall Precaution Comments: Monitor HR Restrictions Weight Bearing Restrictions: No       Mobility Bed Mobility Overal bed mobility: Needs Assistance Bed Mobility: Rolling;Sidelying to Sit Rolling: Min guard Sidelying to sit: HOB elevated;Min guard;Min assist       General bed mobility comments: Min guard for rolling to R and for sidelying to sit at EOB with Mod use of rail.Increased  time/effort 2/2 pain at L flank.    Transfers Overall transfer level: Needs assistance Equipment used: Rolling walker (2 wheeled) Transfers: Sit to/from [facility] Sit to Stand: Min assist Stand pivot transfers: Min assist       General transfer comment: Min A for sit to stand from elevated EOB and Min A for stand-pivot to recliner with use of RW.    Balance Overall balance assessment: Needs assistance Sitting-balance support: No upper extremity supported;Feet supported Sitting balance-Leahy Scale: Fair Sitting balance - Comments: Able to maintain static/dynamic sitting balance at EOB during bathing/dressing without external assist and no LOB.     Standing balance-Leahy Scale: Poor Standing balance comment: Reliant on BUE support on RW.                           ADL either performed or assessed with clinical judgement   ADL Overall ADL's : Needs assistance/impaired     Grooming: Set up;Sitting   Upper Body Bathing: Sitting;Set up Upper Body Bathing Details (indicate cue type and reason): Able to wash chest, arms and abdomen with set-up assist. Lower Body Bathing: Maximal assistance Lower Body Bathing Details (indicate cue type and reason): Max A for time management. Patient limited by fatigue. Upper Body Dressing : Sitting;Set up Upper Body Dressing Details (indicate cue type and reason): Donned anterior hospital gown seated EOB. Lower Body Dressing: Moderate assistance Lower Body Dressing Details (indicate cue type and reason): Able to doff/don bilateral footwear with increased time/effort in figure-4 position. Cues for rest breaks.       Toileting -  Clothing Manipulation Details (indicate cue type and reason): Supervision A with use of urinal.       General ADL Comments: Patient continues to be limited by generalized weakness, decreased activity tolerance and decreased balance     Vision       Perception     Praxis       Cognition Arousal/Alertness: Awake/alert Behavior During Therapy: WFL for tasks assessed/performed Overall Cognitive Status: Within Functional Limits for tasks assessed                                 General Comments: Cognition continues to improve; A&Ox4; follows 1-2 step verbal commands with good accuracy        Exercises     Shoulder Instructions       General Comments SpO2 >95% throughout on RA. HR 96bpm at rest. 124 Max.    Pertinent Vitals/ Pain       Pain Assessment: 0-10 Pain Score: 8  Pain Location: L flank Pain Descriptors / Indicators: Guarding;Aching;Sore Pain Intervention(s): Limited activity within patient's tolerance;Monitored during session;Repositioned  Home Living                                          Prior Functioning/Environment              Frequency  Min 2X/week        Progress Toward Goals  OT Goals(current goals can now be found in the care plan section)  Progress towards OT goals: Progressing toward goals  Acute Rehab OT Goals Patient Stated Goal: start walking OT Goal Formulation: With patient/family Time For Goal Achievement: 12/21/20 Potential to Achieve Goals: Good ADL Goals Pt Will Perform Grooming: with supervision;standing Pt Will Perform Upper Body Dressing: with set-up;sitting Pt Will Perform Lower Body Dressing: with supervision;sit to/from stand;with adaptive equipment Pt Will Transfer to Toilet: with supervision;ambulating Pt Will Perform Toileting - Clothing Manipulation and hygiene: with supervision;sit to/from stand Pt Will Perform Tub/Shower Transfer: with supervision;ambulating;shower seat Additional ADL Goal #1: Patient will follow 1-step verbal commands with 95% accuracy in prep for ADLs.  Plan Discharge plan remains appropriate    Co-evaluation                 AM-PAC OT "6 Clicks" Daily Activity     Outcome Measure   Help from another person eating meals?: A  Little Help from another person taking care of personal grooming?: A Little Help from another person toileting, which includes using toliet, bedpan, or urinal?: A Lot Help from another person bathing (including washing, rinsing, drying)?: A Lot Help from another person to put on and taking off regular upper body clothing?: A Little Help from another person to put on and taking off regular lower body clothing?: A Lot 6 Click Score: 15    End of Session Equipment Utilized During Treatment: Rolling walker  OT Visit Diagnosis: Unsteadiness on feet (R26.81);Muscle weakness (generalized) (M62.81)   Activity Tolerance Patient tolerated treatment well;Patient limited by fatigue   Patient Left with call bell/phone within reach;in chair;with chair alarm set   Nurse Communication Mobility status        Time: 6644-0347 OT Time Calculation (min): 38 min  Charges: OT General Charges $OT Visit: 1 Visit OT Treatments $Self Care/Home Management : 38-52 mins  Janmichael Giraud H. OTR/L Supplemental OT,  Department of rehab services ([place]. 12/12/2020, 8:50 AM

## 2020-12-12 NOTE — Progress Notes (Signed)
FPTS Brief Progress Note  S Patient was sleeping comfortably. I did not wake the patient. No concerns from night RN.  O: BP 125/83 (BP Location: Right Arm)   Pulse 99   Temp 98.5 F (36.9 C) (Oral)   Resp (!) 22   Ht 5\' 10"  (1.778 m)   Wt 124.3 kg   SpO2 96%   BMI 39.32 kg/m     A/P: Plan per day team  -CBC aim -Pending CIR placement  - Orders reviewed. Labs for AM ordered, which was adjusted as needed.   , MD 12/12/2020, 7:55 PM PGY-3, [facility] Night Resident  Please page [phone] with questions.

## 2020-12-12 NOTE — Progress Notes (Signed)
Physical Therapy Treatment Patient Details Name: Ernest Rodgers MRN: [id number] DOB: 1970/11/21 Today's Date: 12/12/2020    History of Present Illness 50 yo male presents to [facility] on 8/15 with L rib pain, epigastric pain radiating to back after jump into ball pit. CT chest shows acute pancreatitis with likely reactive changes to duodenum, hepatic steatosis, L 6-8th rib fx. on CIWA. On 8/18, pt with rapid response called due to tachycardia and hypoxia to 60s suspect due to HF vs ARDS, ETT 8/18-8/23 . CT abd shows small bowel obstruction on 8/21, OGT 8/21-8/23. Worsening agitation and hallucinations starting 8/23. s/p successful splenic artery embolization on 8/25. 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    PT Comments    Pt making good progress and able to ambulate in hallway today, but with significant DOE and increased respiratory rate (sats stable) requiring rest breaks and cues for relaxed breathing.   Continue to recommend CIR due to decreased mobility and endurance.   Follow Up Recommendations  CIR     Equipment Recommendations  Rolling walker with 5" wheels;3in1 (PT)    Recommendations for Other Services       Precautions / Restrictions Precautions Precautions: Fall Precaution Comments: Monitor HR    Mobility  Bed Mobility Overal bed mobility: Needs Assistance Bed Mobility: Rolling;Sidelying to Sit Rolling: Min guard Sidelying to sit: Min guard       General bed mobility comments: Wife assisted with transfer to EOB    Transfers Overall transfer level: Needs assistance Equipment used: Rolling walker (2 wheeled) Transfers: Sit to/from Stand Sit to Stand: Min guard;Min assist         General transfer comment: Min guard from bed for safety; min A and increased time from lower chair  Ambulation/Gait Ambulation/Gait assistance: Min assist Gait Distance (Feet): 40 Feet  (40'x2) Assistive device: Rolling walker (2 wheeled) Gait Pattern/deviations: Step-to pattern;Decreased stride length Gait velocity: decreased   General Gait Details: Ambulated 40' x 2 but with DOE of 4/4 and requiring seated rest breaks. Min cues for controlled breathing and posture/RW proximity   Social research officer, [employer] (Stroke Patients Only)       Balance Overall balance assessment: Needs assistance Sitting-balance support: No upper extremity supported;Feet supported Sitting balance-Leahy Scale: Good Sitting balance - Comments: Pt donning shorts at EOB     Standing balance-Leahy Scale: Poor Standing balance comment: Reliant on BUE support on RW.                            Cognition Arousal/Alertness: Awake/alert Behavior During Therapy: WFL for tasks assessed/performed Overall Cognitive Status: Within Functional Limits for tasks assessed                                        Exercises      General Comments General comments (skin integrity, edema, etc.): Pt on RA with SpO2 95% or >; HR 90's - 117 bpm during session; RR up to 40 with activity      Pertinent Vitals/Pain Pain Assessment: Faces Faces Pain Scale: Hurts a little bit Pain Location: L flank Pain Descriptors / Indicators: Guarding;Aching;Sore Pain Intervention(s): Limited activity within patient's tolerance;Monitored during session  Home Living                      Prior Function            PT Goals (current goals can now be found in the care plan section) Progress towards PT goals: Progressing toward goals    Frequency    Min 3X/week      PT Plan Current plan remains appropriate    Co-evaluation              AM-PAC PT "6 Clicks" Mobility   Outcome Measure  Help needed turning from your back to your side while in a flat bed without using bedrails?: A Little Help needed moving from lying on your back  to sitting on the side of a flat bed without using bedrails?: A Little Help needed moving to and from a bed to a chair (including a wheelchair)?: A Little Help needed standing up from a chair using your arms (e.g., wheelchair or bedside chair)?: A Little Help needed to walk in hospital room?: A Little Help needed climbing 3-5 steps with a railing? : A Lot 6 Click Score: 17    End of Session Equipment Utilized During Treatment: Gait belt Activity Tolerance: Patient tolerated treatment well Patient left: with call bell/phone within reach;in bed;with family/visitor present (pt not comfortable in chair - left sitting EOB; no alarm as wife (who is Charity fundraiser) present and has been assisting pt with transfers) Nurse Communication: Mobility status PT Visit Diagnosis: Other abnormalities of gait and mobility (R26.89);Difficulty in walking, not elsewhere classified (R26.2)     Time: 3474-2595 PT Time Calculation (min) (ACUTE ONLY): 35 min  Charges:  $Gait Training: 23-37 mins                     Ernest Rodgers, PT Acute Rehab Services Pager [phone] [facility] [phone]    Ernest Rodgers 12/12/2020, 1:25 PM

## 2020-12-12 NOTE — Progress Notes (Addendum)
Family Medicine Teaching Service Daily Progress Note Intern Pager: [phone]  Patient name: Ernest Rodgers Medical record number: [id number] Date of birth: December 26, 1970 Age: 50 y.o. Gender: male  Primary Care Provider: Pcp, No Consultants: pulm, gen surgery, GI s/o  Code Status: Full  Pt Overview and Major Events to Date:  8/15 Admitted to FPTS for acute pancreatitis  8/16 symptoms of alcohol withdrawal 8/18 became unresponsive on the floor and hypoxic 8/25 for ileus versus small bowel 8/23 extubated 8/24 evidence of perisplenic hematoma 8/25 splenic embolization by IR 8/29 Transferred back to FPTS  Assessment and Plan:  Ernest Rodgers is a 50 year old male who presented with acute EtOH pancreatitis, was briefly in the ICU and extubated subsequently on 8/23.  He had developed altered mental status with severe hallucinations and agitation after extubation.  Patient was also found to have aspiration pneumonitis and was completed a course of linezolid and ceftriaxone.  The patient had a splenic hematoma that was embolized by IR on 8/25 that was caused by fall versus pancreatitis.  Tachycardia and tachypnea He still has episodes, and he notes that he is fairly short of breath this morning while sitting in his chair.  He was unable to take a bath today due to his HR. Pain in the chest is seemingly pleuritic in nature. He has tolerated a regular diet. -Will continue with the CTPA -All other findings have been negative -Continue to work with PT/OT as tolerated -Monitor vitals  -Pending updated CMP -Once CTPA and Creatinine result-can continue with home lasix dose and switch to lovenox for DVT ppx   Aspiration pneumonitis noted to be MRSA in the sputum and patient has completed abx course of linezolid and ceftriaxone. He is breathing on room air.  Splenic hematoma Acute blood loss anemia likely in the setting of ACD Status post embolization by IR on 8/25, hemoglobin trend 8.4 > 8.6 > 7.8 > 8.1 >  8.7 -Hemoglobin stable at this time -Prophylaxis started yesterday -Available splenic vaccines needed for d/c have been ordered   SBO versus ileus GI was following however patient has been having bowel movements and has had a regular diet, tolerating well. -GI signed off -Continue with regular diet -TPN discontinued  Elevated LFTs, resolving  LFTs have been downtrending gradually.  Hypertension Patient's blood pressure has been well controlled.  Home medications include metoprolol succinate 50 mg 1 time daily and lisinopril 10 mg.  Continue with metoprolol tartrate 25 mg 2 times daily and lisinopril 10 mg  GERD Pantoprazole 40 mg daily  History of fall with acute nondisplaced left rib fractures Patient is not currently in pain. -Rib pain overnight that required 1 dose of Oxy 2.5 mg -Oxy on PRN   Acute metabolic encephalopathy, resolved Multifactorial in origin.  He has not had any hallucinations at this moment.  Depression Home medication: Fluoxetine 20 mg every morning We will restart home medication  Alcohol Use Disorder  Will check to see if the pt has seen social work and may start naltrexone outpatient. CIWAs low 0>0>2. No symptoms today.   FEN/GI: Regular diet PPx: Subq heparin Dispo:CIR tomorrow. Barriers include clinical improvement.   Subjective:  Pt has fairly SOB this morning while sitting in his chair. He reports that he is in some pain today. Rib pain and some pleuritic pain also present. Continues to tolerate his diet well.   Objective: Temp:  [97.4 F (36.3 C)-98.8 F (37.1 C)] 98.5 F (36.9 C) (08/31 0330) Pulse Rate:  [85-116] 92 (08/31 0330)  Resp:  [19-30] 20 (08/31 0330) BP: (119-129)/(76-95) 129/86 (08/31 0330) SpO2:  [90 %-99 %] 97 % (08/31 0330) Physical Exam Constitutional:      Appearance: He is not ill-appearing.     Comments: Slightly short of breath in the chair in room   Cardiovascular:     Rate and Rhythm: Regular rhythm.  Tachycardia present.     Heart sounds: No murmur heard. Pulmonary:     Effort: No respiratory distress (tachypneic).     Breath sounds: Normal breath sounds.  Abdominal:     General: Bowel sounds are normal. There is no distension or abdominal bruit.     Palpations: Abdomen is soft.  Skin:    General: Skin is warm and dry.  Neurological:     Mental Status: He is alert.     Motor: Weakness present.  Psychiatric:        Mood and Affect: Mood normal.        Behavior: Behavior normal.     Laboratory: Recent Labs  Lab 12/10/20 0334 12/11/20 0340 12/12/20 0044  WBC 11.4* 9.6 8.7  HGB 7.8* 8.1* 8.7*  HCT 23.6* 25.2* 25.5*  PLT 268 281 259   Recent Labs  Lab 12/06/20 0730 12/07/20 0414 12/09/20 0412 12/10/20 0334 12/11/20 0340  NA 151*   < > 140 139 137  K 3.2*   < > 3.7 3.8 3.8  CL 119*   < > 107 111 107  CO2 23   < > 22 22 21*  BUN 35*   < > 11 13 13   CREATININE 0.87   < > 0.64 0.64 0.61  CALCIUM 8.1*   < > 8.5* 8.3* 8.3*  PROT 5.1*  --   --  5.0* 5.2*  BILITOT 1.7*  --   --  1.2 1.2  ALKPHOS 59  --   --  104 125  ALT 29  --   --  53* 57*  AST 43*  --   --  71* 54*  GLUCOSE 181*   < > 172* 151* 150*   < > = values in this interval not displayed.     , MD 12/12/2020, 11:17 AM PGY-1, [facility] FPTS Intern pager: [phone], text pages welcome

## 2020-12-13 ENCOUNTER — Encounter (HOSPITAL_COMMUNITY): Payer: Self-pay | Admitting: Student

## 2020-12-13 ENCOUNTER — Inpatient Hospital Stay (HOSPITAL_COMMUNITY)
Admission: RE | Admit: 2020-12-13 | Discharge: 2020-12-21 | DRG: 945 | Disposition: A | Discharge status: Home / Self Care | Payer: Managed Care, Other (non HMO) | Source: Intra-hospital | Attending: Physical Medicine and Rehabilitation | Admitting: Physical Medicine and Rehabilitation

## 2020-12-13 ENCOUNTER — Other Ambulatory Visit: Payer: Self-pay

## 2020-12-13 ENCOUNTER — Encounter (HOSPITAL_COMMUNITY): Payer: Self-pay | Admitting: Physical Medicine and Rehabilitation

## 2020-12-13 DIAGNOSIS — E876 Hypokalemia: Secondary | ICD-10-CM

## 2020-12-13 DIAGNOSIS — M62838 Other muscle spasm: Secondary | ICD-10-CM | POA: Diagnosis present

## 2020-12-13 DIAGNOSIS — R7989 Other specified abnormal findings of blood chemistry: Secondary | ICD-10-CM | POA: Diagnosis present

## 2020-12-13 DIAGNOSIS — Z741 Need for assistance with personal care: Secondary | ICD-10-CM | POA: Diagnosis present

## 2020-12-13 DIAGNOSIS — F329 Major depressive disorder, single episode, unspecified: Secondary | ICD-10-CM | POA: Diagnosis present

## 2020-12-13 DIAGNOSIS — Z6835 Body mass index (BMI) 35.0-35.9, adult: Secondary | ICD-10-CM

## 2020-12-13 DIAGNOSIS — Z713 Dietary counseling and surveillance: Secondary | ICD-10-CM

## 2020-12-13 DIAGNOSIS — Z8249 Family history of ischemic heart disease and other diseases of the circulatory system: Secondary | ICD-10-CM

## 2020-12-13 DIAGNOSIS — R52 Pain, unspecified: Secondary | ICD-10-CM

## 2020-12-13 DIAGNOSIS — G4700 Insomnia, unspecified: Secondary | ICD-10-CM | POA: Diagnosis present

## 2020-12-13 DIAGNOSIS — J810 Acute pulmonary edema: Secondary | ICD-10-CM | POA: Diagnosis present

## 2020-12-13 DIAGNOSIS — E559 Vitamin D deficiency, unspecified: Secondary | ICD-10-CM | POA: Diagnosis present

## 2020-12-13 DIAGNOSIS — T1490XA Injury, unspecified, initial encounter: Secondary | ICD-10-CM

## 2020-12-13 DIAGNOSIS — Z9884 Bariatric surgery status: Secondary | ICD-10-CM | POA: Diagnosis not present

## 2020-12-13 DIAGNOSIS — D735 Infarction of spleen: Secondary | ICD-10-CM | POA: Diagnosis present

## 2020-12-13 DIAGNOSIS — R5381 Other malaise: Principal | ICD-10-CM | POA: Diagnosis present

## 2020-12-13 DIAGNOSIS — Z87442 Personal history of urinary calculi: Secondary | ICD-10-CM

## 2020-12-13 DIAGNOSIS — F1010 Alcohol abuse, uncomplicated: Secondary | ICD-10-CM | POA: Diagnosis present

## 2020-12-13 DIAGNOSIS — K661 Hemoperitoneum: Secondary | ICD-10-CM | POA: Diagnosis present

## 2020-12-13 DIAGNOSIS — R4781 Slurred speech: Secondary | ICD-10-CM | POA: Diagnosis present

## 2020-12-13 DIAGNOSIS — R739 Hyperglycemia, unspecified: Secondary | ICD-10-CM | POA: Diagnosis present

## 2020-12-13 DIAGNOSIS — W19XXXD Unspecified fall, subsequent encounter: Secondary | ICD-10-CM | POA: Diagnosis present

## 2020-12-13 DIAGNOSIS — I10 Essential (primary) hypertension: Secondary | ICD-10-CM | POA: Diagnosis present

## 2020-12-13 DIAGNOSIS — K852 Alcohol induced acute pancreatitis without necrosis or infection: Secondary | ICD-10-CM

## 2020-12-13 DIAGNOSIS — K859 Acute pancreatitis without necrosis or infection, unspecified: Secondary | ICD-10-CM | POA: Diagnosis present

## 2020-12-13 DIAGNOSIS — M25512 Pain in left shoulder: Secondary | ICD-10-CM | POA: Diagnosis not present

## 2020-12-13 DIAGNOSIS — R269 Unspecified abnormalities of gait and mobility: Secondary | ICD-10-CM | POA: Diagnosis present

## 2020-12-13 DIAGNOSIS — Z87891 Personal history of nicotine dependence: Secondary | ICD-10-CM | POA: Diagnosis not present

## 2020-12-13 DIAGNOSIS — K760 Fatty (change of) liver, not elsewhere classified: Secondary | ICD-10-CM | POA: Diagnosis present

## 2020-12-13 DIAGNOSIS — S2242XD Multiple fractures of ribs, left side, subsequent encounter for fracture with routine healing: Secondary | ICD-10-CM

## 2020-12-13 DIAGNOSIS — S2242XA Multiple fractures of ribs, left side, initial encounter for closed fracture: Secondary | ICD-10-CM | POA: Diagnosis present

## 2020-12-13 DIAGNOSIS — R000 Tachycardia, unspecified: Secondary | ICD-10-CM | POA: Diagnosis present

## 2020-12-13 DIAGNOSIS — R1011 Right upper quadrant pain: Secondary | ICD-10-CM | POA: Diagnosis not present

## 2020-12-13 DIAGNOSIS — R040 Epistaxis: Secondary | ICD-10-CM | POA: Diagnosis present

## 2020-12-13 DIAGNOSIS — R0600 Dyspnea, unspecified: Secondary | ICD-10-CM

## 2020-12-13 DIAGNOSIS — D62 Acute posthemorrhagic anemia: Secondary | ICD-10-CM | POA: Diagnosis present

## 2020-12-13 LAB — CBC
HCT: 26.1 % — ABNORMAL LOW (ref 39.0–52.0)
Hemoglobin: 8.2 g/dL — ABNORMAL LOW (ref 13.0–17.0)
MCH: 33.5 pg (ref 26.0–34.0)
MCHC: 31.4 g/dL (ref 30.0–36.0)
MCV: 106.5 fL — ABNORMAL HIGH (ref 80.0–100.0)
Platelets: 249 10*3/uL (ref 150–400)
RBC: 2.45 MIL/uL — ABNORMAL LOW (ref 4.22–5.81)
RDW: 19.1 % — ABNORMAL HIGH (ref 11.5–15.5)
WBC: 8 10*3/uL (ref 4.0–10.5)
nRBC: 0.8 % — ABNORMAL HIGH (ref 0.0–0.2)

## 2020-12-13 LAB — GLUCOSE, CAPILLARY: Glucose-Capillary: 123 mg/dL — ABNORMAL HIGH (ref 70–99)

## 2020-12-13 MED ORDER — TAMSULOSIN HCL 0.4 MG PO CAPS
0.4000 mg | ORAL_CAPSULE | Freq: Every day | ORAL | Status: DC
  Administered 2020-12-13: 0.4 mg via ORAL
  Filled 2020-12-13: qty 1

## 2020-12-13 MED ORDER — ACETAMINOPHEN 325 MG PO TABS
650.0000 mg | ORAL_TABLET | Freq: Four times a day (QID) | ORAL | Status: DC
  Administered 2020-12-13 – 2020-12-21 (×28): 650 mg via ORAL
  Filled 2020-12-13 (×30): qty 2

## 2020-12-13 MED ORDER — ORAL CARE MOUTH RINSE
15.0000 mL | Freq: Two times a day (BID) | OROMUCOSAL | Status: DC
  Administered 2020-12-13 – 2020-12-14 (×3): 15 mL via OROMUCOSAL

## 2020-12-13 MED ORDER — TRAZODONE HCL 50 MG PO TABS
25.0000 mg | ORAL_TABLET | Freq: Every evening | ORAL | Status: DC | PRN
  Administered 2020-12-13: 50 mg via ORAL
  Filled 2020-12-13: qty 1

## 2020-12-13 MED ORDER — VITAMIN A 3 MG (10000 UNIT) PO CAPS: 20000.0000 [IU] | ORAL_CAPSULE | Freq: Every day | ORAL | 0 refills | Status: DC

## 2020-12-13 MED ORDER — ENSURE ENLIVE PO LIQD
237.0000 mL | Freq: Three times a day (TID) | ORAL | Status: DC
  Administered 2020-12-13 – 2020-12-14 (×2): 237 mL via ORAL

## 2020-12-13 MED ORDER — THIAMINE HCL 100 MG PO TABS
100.0000 mg | ORAL_TABLET | Freq: Every day | ORAL | Status: DC
  Administered 2020-12-14 – 2020-12-21 (×8): 100 mg via ORAL
  Filled 2020-12-13 (×8): qty 1

## 2020-12-13 MED ORDER — POLYETHYLENE GLYCOL 3350 17 G PO PACK: 17.0000 g | PACK | Freq: Every day | ORAL | Status: DC | PRN

## 2020-12-13 MED ORDER — ALUM & MAG HYDROXIDE-SIMETH 200-200-20 MG/5ML PO SUSP
30.0000 mL | ORAL | Status: DC | PRN
  Administered 2020-12-15 – 2020-12-19 (×5): 30 mL via ORAL
  Filled 2020-12-13 (×5): qty 30

## 2020-12-13 MED ORDER — GUAIFENESIN-DM 100-10 MG/5ML PO SYRP
5.0000 mL | ORAL_SOLUTION | Freq: Four times a day (QID) | ORAL | Status: DC | PRN
  Filled 2020-12-13: qty 10

## 2020-12-13 MED ORDER — BISACODYL 10 MG RE SUPP
10.0000 mg | Freq: Every day | RECTAL | Status: DC | PRN
Start: 2020-12-13 — End: 2020-12-21

## 2020-12-13 MED ORDER — FOLIC ACID 1 MG PO TABS
1.0000 mg | ORAL_TABLET | Freq: Every day | ORAL | Status: DC
  Administered 2020-12-14 – 2020-12-21 (×8): 1 mg via ORAL
  Filled 2020-12-13 (×8): qty 1

## 2020-12-13 MED ORDER — FOLIC ACID 1 MG PO TABS: 1.0000 mg | ORAL_TABLET | Freq: Every day | ORAL | Status: DC

## 2020-12-13 MED ORDER — CHLORHEXIDINE GLUCONATE CLOTH 2 % EX PADS
6.0000 | MEDICATED_PAD | Freq: Every day | CUTANEOUS | Status: DC
  Administered 2020-12-14: 6 via TOPICAL

## 2020-12-13 MED ORDER — SENNA 8.6 MG PO TABS
1.0000 | ORAL_TABLET | Freq: Every day | ORAL | Status: DC | PRN
  Administered 2020-12-17 – 2020-12-21 (×3): 8.6 mg via ORAL
  Filled 2020-12-13 (×3): qty 1

## 2020-12-13 MED ORDER — FLEET ENEMA 7-19 GM/118ML RE ENEM: 1.0000 | ENEMA | Freq: Once | RECTAL | Status: DC | PRN

## 2020-12-13 MED ORDER — FUROSEMIDE 20 MG PO TABS
20.0000 mg | ORAL_TABLET | Freq: Every morning | ORAL | Status: DC
  Administered 2020-12-14 – 2020-12-21 (×8): 20 mg via ORAL
  Filled 2020-12-13 (×8): qty 1

## 2020-12-13 MED ORDER — SENNA 8.6 MG PO TABS
1.0000 | ORAL_TABLET | Freq: Every day | ORAL | Status: DC | PRN
  Filled 2020-12-13: qty 1

## 2020-12-13 MED ORDER — OXYCODONE HCL 5 MG PO TABS
5.0000 mg | ORAL_TABLET | Freq: Four times a day (QID) | ORAL | Status: DC | PRN
  Administered 2020-12-13 – 2020-12-14 (×3): 5 mg via ORAL
  Filled 2020-12-13 (×4): qty 1

## 2020-12-13 MED ORDER — ENOXAPARIN SODIUM 40 MG/0.4ML IJ SOSY
40.0000 mg | PREFILLED_SYRINGE | INTRAMUSCULAR | Status: DC
Start: 2020-12-13 — End: 2020-12-15
  Filled 2020-12-13 (×2): qty 0.4

## 2020-12-13 MED ORDER — ACETAMINOPHEN 325 MG PO TABS: 650.0000 mg | ORAL_TABLET | Freq: Four times a day (QID) | ORAL | Status: AC

## 2020-12-13 MED ORDER — LIDOCAINE 5 % EX PTCH: 1.0000 | MEDICATED_PATCH | Freq: Every day | CUTANEOUS | Status: DC | PRN

## 2020-12-13 MED ORDER — PROCHLORPERAZINE MALEATE 5 MG PO TABS: 5.0000 mg | ORAL_TABLET | Freq: Four times a day (QID) | ORAL | Status: DC | PRN

## 2020-12-13 MED ORDER — CHLORHEXIDINE GLUCONATE 0.12 % MT SOLN
15.0000 mL | Freq: Two times a day (BID) | OROMUCOSAL | Status: DC
  Administered 2020-12-13 – 2020-12-15 (×4): 15 mL via OROMUCOSAL
  Filled 2020-12-13 (×4): qty 15

## 2020-12-13 MED ORDER — RAMELTEON 8 MG PO TABS
8.0000 mg | ORAL_TABLET | Freq: Every day | ORAL | Status: DC
  Administered 2020-12-13 – 2020-12-20 (×8): 8 mg via ORAL
  Filled 2020-12-13 (×8): qty 1

## 2020-12-13 MED ORDER — PANTOPRAZOLE SODIUM 40 MG PO TBEC
40.0000 mg | DELAYED_RELEASE_TABLET | Freq: Every day | ORAL | Status: DC
  Administered 2020-12-14 – 2020-12-21 (×8): 40 mg via ORAL
  Filled 2020-12-13 (×8): qty 1

## 2020-12-13 MED ORDER — ACETAMINOPHEN 325 MG PO TABS
325.0000 mg | ORAL_TABLET | ORAL | Status: DC | PRN
  Administered 2020-12-15 (×3): 650 mg via ORAL
  Administered 2020-12-16: 325 mg via ORAL
  Filled 2020-12-13 (×3): qty 2

## 2020-12-13 MED ORDER — FLUOXETINE HCL 20 MG PO CAPS
20.0000 mg | ORAL_CAPSULE | Freq: Every morning | ORAL | Status: DC
  Administered 2020-12-14 – 2020-12-21 (×8): 20 mg via ORAL
  Filled 2020-12-13 (×8): qty 1

## 2020-12-13 MED ORDER — POTASSIUM CHLORIDE CRYS ER 20 MEQ PO TBCR
20.0000 meq | EXTENDED_RELEASE_TABLET | Freq: Two times a day (BID) | ORAL | Status: DC
  Administered 2020-12-13 – 2020-12-14 (×2): 20 meq via ORAL
  Filled 2020-12-13 (×2): qty 1

## 2020-12-13 MED ORDER — ASCORBIC ACID 1000 MG PO TABS: 1000.0000 mg | ORAL_TABLET | Freq: Every day | ORAL | Status: AC

## 2020-12-13 MED ORDER — LISINOPRIL 10 MG PO TABS
10.0000 mg | ORAL_TABLET | Freq: Every day | ORAL | Status: DC
  Administered 2020-12-14 – 2020-12-21 (×8): 10 mg via ORAL
  Filled 2020-12-13 (×8): qty 1

## 2020-12-13 MED ORDER — ADULT MULTIVITAMIN W/MINERALS CH
1.0000 | ORAL_TABLET | Freq: Every day | ORAL | Status: DC
  Administered 2020-12-14 – 2020-12-21 (×8): 1 via ORAL
  Filled 2020-12-13 (×8): qty 1

## 2020-12-13 MED ORDER — METOPROLOL SUCCINATE ER 50 MG PO TB24
50.0000 mg | ORAL_TABLET | Freq: Every day | ORAL | Status: DC
  Administered 2020-12-14 – 2020-12-21 (×8): 50 mg via ORAL
  Filled 2020-12-13 (×8): qty 1

## 2020-12-13 MED ORDER — ZINC SULFATE 220 (50 ZN) MG PO CAPS: 220.0000 mg | ORAL_CAPSULE | Freq: Every day | ORAL | 0 refills | Status: AC

## 2020-12-13 MED ORDER — PROCHLORPERAZINE 25 MG RE SUPP: 12.5000 mg | Freq: Four times a day (QID) | RECTAL | Status: DC | PRN

## 2020-12-13 MED ORDER — ZINC SULFATE 220 (50 ZN) MG PO CAPS
220.0000 mg | ORAL_CAPSULE | Freq: Every day | ORAL | Status: DC
  Administered 2020-12-14 – 2020-12-21 (×8): 220 mg via ORAL
  Filled 2020-12-13 (×8): qty 1

## 2020-12-13 MED ORDER — IPRATROPIUM-ALBUTEROL 0.5-2.5 (3) MG/3ML IN SOLN: 3.0000 mL | RESPIRATORY_TRACT | Status: DC | PRN

## 2020-12-13 MED ORDER — ASCORBIC ACID 500 MG PO TABS
1000.0000 mg | ORAL_TABLET | Freq: Every day | ORAL | Status: DC
  Administered 2020-12-14 – 2020-12-21 (×8): 1000 mg via ORAL
  Filled 2020-12-13 (×8): qty 2

## 2020-12-13 MED ORDER — VITAMIN B-12 1000 MCG PO TABS
500.0000 ug | ORAL_TABLET | Freq: Every day | ORAL | Status: DC
  Administered 2020-12-14 – 2020-12-21 (×8): 500 ug via ORAL
  Filled 2020-12-13 (×8): qty 1

## 2020-12-13 MED ORDER — ACETAMINOPHEN 325 MG PO TABS
650.0000 mg | ORAL_TABLET | Freq: Four times a day (QID) | ORAL | Status: DC
  Administered 2020-12-13: 650 mg via ORAL
  Filled 2020-12-13: qty 2

## 2020-12-13 MED ORDER — DIPHENHYDRAMINE HCL 12.5 MG/5ML PO ELIX: 12.5000 mg | ORAL_SOLUTION | Freq: Four times a day (QID) | ORAL | Status: DC | PRN

## 2020-12-13 MED ORDER — PROCHLORPERAZINE EDISYLATE 10 MG/2ML IJ SOLN: 5.0000 mg | Freq: Four times a day (QID) | INTRAMUSCULAR | Status: DC | PRN

## 2020-12-13 MED ORDER — VITAMIN A 3 MG (10000 UNIT) PO CAPS
20000.0000 [IU] | ORAL_CAPSULE | Freq: Every day | ORAL | Status: AC
  Administered 2020-12-14 – 2020-12-19 (×6): 20000 [IU] via ORAL
  Filled 2020-12-13 (×8): qty 2

## 2020-12-13 MED ORDER — THIAMINE HCL 100 MG PO TABS: 100.0000 mg | ORAL_TABLET | Freq: Every day | ORAL | Status: DC

## 2020-12-13 MED ORDER — WHITE PETROLATUM EX OINT: TOPICAL_OINTMENT | CUTANEOUS | Status: DC | PRN

## 2020-12-13 MED ORDER — OXYCODONE HCL 5 MG PO TABS: 5.0000 mg | ORAL_TABLET | Freq: Four times a day (QID) | ORAL | 0 refills | Status: DC | PRN

## 2020-12-13 NOTE — Plan of Care (Signed)
  Problem: Education: Goal: Knowledge of General Education information will improve Description: Including pain rating scale, medication(s)/side effects and non-pharmacologic comfort measures Outcome: Progressing   Problem: Clinical Measurements: Goal: Ability to maintain clinical measurements within normal limits will improve Outcome: Progressing Goal: Will remain free from infection Outcome: Progressing Goal: Respiratory complications will improve Outcome: Progressing Goal: Cardiovascular complication will be avoided Outcome: Progressing   Problem: Nutrition: Goal: Adequate nutrition will be maintained Outcome: Progressing   Problem: Coping: Goal: Level of anxiety will decrease Outcome: Progressing   Problem: Elimination: Goal: Will not experience complications related to bowel motility Outcome: Progressing   Problem: Pain Managment: Goal: General experience of comfort will improve Outcome: Progressing

## 2020-12-13 NOTE — Progress Notes (Signed)
Courtney Heys, MD   Physician  Physical Medicine and Rehabilitation  PMR Pre-admission      Signed  Date of Service:  12/11/2020  2:58 PM       Related encounter: ED to Hosp-Admission (Discharged) from 11/26/2020 in [place]      Show:Clear all [x] Written[x] Templated[] Copied  Added by: [x] Nioka Thorington, Vertis Kelch, RN[x] Courtney Heys, MD  [] Hover for details                                                                                                                                                                                                                                                                                                                                                                                                                                     PMR Admission Coordinator Pre-Admission Assessment   Patient: Ernest Rodgers is an 50 y.o., male MRN: [id number] DOB: [birth year] Height: 5' 10"  (177.8 cm) Weight: 124.3 kg   Insurance Information HMO: yes    PPO:      PCP:      IPA:      80/20:      OTHER:  PRIMARY: [employer]      Policy#: [id number]      Subscriber: pt CM Name: Butch Penny      Phone#: [phone] ext [phone]     Fax#: [phone] Pre-Cert#: [id number] approved for 7 days  with f/u with [facility] phone [phone] ext [phone] fax [phone] updates due 9/6      Employer: Travel Nurse Benefits:  Phone #: [phone]     Name: 8/29 Eff. Date: 04/14/2020     Deduct: $1500      Out of Pocket Max: $6000      Life Max: none CIR: 80%      SNF: 80% Outpatient: $25 co pay per visit     Co-Pay: 65 visits combined Home Health: 80%      Co-Pay: 60 visits combined DME: 80%     Co-Pay: 20% Providers: in network  SECONDARY: none   Financial Counselor:       Phone#:    The Conservator, museum/gallery" for patients in Inpatient Rehabilitation Facilities with attached "Privacy Act [place] Records" was provided and verbally reviewed with: N/A   Emergency Contact Information Contact Information       Name Relation Home Work Mobile    Clifton,AJ Spouse     [phone]    Cartee,Shannon Daughter     [phone]           Current Medical History  Patient Admitting Diagnosis: acute pancreatitis, Debility   History of Present Illness: 50 year old male with history of GERD, HTN, MDD, ETOH use disorder, anxiety, depression and class 2 obesity s/p gastric bypass.     Presented 11/26/20 with epigastric pain radiating to the back. Was seen in Urgent care two days pta for rib pain after falling into a ball pit. He was prescribed prednisone taper, cyclobenzaprine for muscle spasms and given IM decadron.  Given Dilaudid, Zofran and Norma saline bolus in ER. CT scan of the abdomen showed pancreatic necrosis possible evidence of pancreatitis. GI consulted . Given aggressive hydration, Dilaudid for pain, and Zofran for Nausea and vomiting. Supportive care in th  ICU. Patient did experience delirium and hallucinations in the ICU felt multifactorial. He received Ativan for withdrawal. Evaluated by Rapid Response on 8/18 when unresponsive to painful stimuli and suspected encephalopathy due to worsening alcohol withdrawal . Flash pulmonary edema was a complication of his pancreatitis and underlying diastolic dysfunction with an element of hepatic congestion . CCM consulted and transferred to ICU. Subsequently intubated 11/29/20 until 12/03/2020. During intubation found to have SBO. ICU course complicated by development of aspiration PNA and the SBO. Surgery evaluated and decided he had adequate bowel movements with no need for surgical intervention. Also found to have splenic hematoma and had embolization of the spleen via IR on 8/25. Hgb has been stable. He received meningococcal and  Hib vaccines in hospital . Still needs Shingrix and PCV 20 as an outpatient. MRSA on sputum culture. Completed course of Linezolid and CTX. ON room air.    Patient's medical record from [facility] has been reviewed by the rehabilitation admissions coordinator and physician.    Past Medical History      Past Medical History:  Diagnosis Date   Hypertension     Kidney stone      Has the patient had major surgery during 100 days prior to admission? No   Family History   family history is not on file.   Current Medications   Current Facility-Administered Medications:    0.9 %  sodium chloride infusion, 250 mL, Intravenous, Continuous, Maudie Mercury, MD, Last Rate: 10 mL/hr at 12/08/20 2200, Infusion Verify at 12/08/20 2200   acetaminophen (TYLENOL) tablet 650 mg, 650 mg, Oral, Q6H, Zigmund Daniel, Allee, MD, 650 mg  at 12/13/20 1016   ascorbic acid (VITAMIN C) tablet 1,000 mg, 1,000 mg, Oral, Daily, Paytes, Austin A, RPH, 1,000 mg at 12/13/20 1016   chlorhexidine (PERIDEX) 0.12 % solution 15 mL, 15 mL, Mouth Rinse, BID, Olalere, Adewale A, MD, 15 mL at 12/13/20 1017   Chlorhexidine Gluconate Cloth 2 % PADS 6 each, 6 each, Topical, Daily, Agarwala, Ravi, MD, 6 each at 12/13/20 1146   enoxaparin (LOVENOX) injection 60 mg, 60 mg, Subcutaneous, Q24H, Espinoza, Alejandra, DO   feeding supplement (ENSURE ENLIVE / ENSURE PLUS) liquid 237 mL, 237 mL, Oral, TID BM, Martyn Malay, MD, 237 mL at 12/13/20 1131   FLUoxetine (PROZAC) capsule 20 mg, 20 mg, Oral, q morning, Simmons-Robinson, Makiera, MD, 20 mg at 67/61/95 0932   folic acid (FOLVITE) tablet 1 mg, 1 mg, Oral, Daily, Brewington, Eden E, RPH, 1 mg at 12/13/20 1016   furosemide (LASIX) tablet 20 mg, 20 mg, Oral, q morning, Espinoza, Alejandra, DO, 20 mg at 12/13/20 1016   ipratropium-albuterol (DUONEB) 0.5-2.5 (3) MG/3ML nebulizer solution 3 mL, 3 mL, Nebulization, Q4H PRN, Bowser, Grace E, NP, 3 mL at 12/11/20 0821   lidocaine (LIDODERM) 5  % 1 patch, 1 patch, Transdermal, Daily PRN, Nita Sells, Alejandra, DO, 1 patch at 12/11/20 2055   lisinopril (ZESTRIL) tablet 10 mg, 10 mg, Oral, Daily, Espinoza, Alejandra, DO, 10 mg at 12/13/20 1016   MEDLINE mouth rinse, 15 mL, Mouth Rinse, q12n4p, Olalere, Adewale A, MD, 15 mL at 12/11/20 1222   metoprolol succinate (TOPROL-XL) 24 hr tablet 50 mg, 50 mg, Oral, Daily, Martyn Malay, MD, 50 mg at 12/13/20 1130   oxyCODONE (Oxy IR/ROXICODONE) immediate release tablet 5 mg, 5 mg, Oral, Q6H PRN, Zigmund Daniel, Allee, MD, 5 mg at 12/13/20 1016   pantoprazole (PROTONIX) EC tablet 40 mg, 40 mg, Oral, Daily, Henri Medal, RPH, 40 mg at 12/13/20 1016   polyethylene glycol (MIRALAX / GLYCOLAX) packet 17 g, 17 g, Oral, Daily PRN, Nita Sells, Alejandra, DO   ramelteon (ROZEREM) tablet 8 mg, 8 mg, Oral, QHS, Espinoza, Alejandra, DO, 8 mg at 12/12/20 2208   senna (SENOKOT) tablet 8.6 mg, 1 tablet, Oral, Daily PRN, Espinoza, Alejandra, DO   sodium chloride flush (NS) 0.9 % injection 10-40 mL, 10-40 mL, Intracatheter, Q12H, Olalere, Adewale A, MD, 10 mL at 12/13/20 1146   sodium chloride flush (NS) 0.9 % injection 10-40 mL, 10-40 mL, Intracatheter, PRN, Olalere, Adewale A, MD, 10 mL at 12/07/20 2000   tamsulosin (FLOMAX) capsule 0.4 mg, 0.4 mg, Oral, QPC supper, Dorris Singh M, MD, 0.4 mg at 12/12/20 1622   thiamine tablet 100 mg, 100 mg, Oral, Daily, Brewington, Eden E, RPH, 100 mg at 12/13/20 1017   vitamin A capsule 20,000 Units, 20,000 Units, Oral, Daily, Paytes, Austin A, RPH, 20,000 Units at 12/13/20 1016   white petrolatum (VASELINE) gel, , Topical, PRN, Mannam, Praveen, MD, 0.2 application at 67/12/45 1017   zinc sulfate capsule 220 mg, 220 mg, Oral, Daily, Brewington, Eden E, RPH, 220 mg at 12/13/20 1017   Patients Current Diet:  Diet Order                  Diet regular Room service appropriate? Yes; Fluid consistency: Thin  Diet effective now                       Precautions /  Restrictions Precautions Precautions: Fall Precaution Comments: Monitor HR Restrictions Weight Bearing Restrictions: No    Has  the patient had 2 or more falls or a fall with injury in the past year? 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 Independent   Indoor Mobility: Did the patient need assistance with walking from room to room (with or without device)? Independent   Stairs: Did the patient need assistance with internal or external stairs (with or without device)? Independent   Functional Cognition: Did the patient need help planning regular tasks such as shopping or remembering to take medications? Independent   Patient Information Are you of Hispanic, Latino/a,or Spanish origin?: A. No, not of Hispanic, Latino/a, or Spanish origin What is your race?: A. White Do you need or want an interpreter to communicate with a doctor or health care staff?: 0. No   Patient's Response To:  Health Literacy and Transportation Is the patient able to respond to health literacy and transportation needs?: Yes Health Literacy - How often do you need to have someone help you when you read instructions, pamphlets, or other written material from your doctor or pharmacy?: Never In the past 12 months, has lack of transportation kept you from medical appointments or from getting medications?: No In the past 12 months, has lack of transportation kept you from meetings, work, or from getting things needed for daily living?: No   Development worker, international aid / [place] Devices/Equipment: Eyeglasses Home Equipment: None   Prior Device Use: Indicate devices/aids used by the patient prior to current illness, exacerbation or injury? None of the above   Current Functional Level Cognition   Overall Cognitive Status: Within Functional Limits for  tasks assessed Current Attention Level: Focused Orientation Level: Oriented X4 Following Commands: Follows one step commands inconsistently, Follows one step commands with increased time Safety/Judgement: Decreased awareness of safety, Decreased awareness of deficits General Comments: Cognition continues to improve; A&Ox4; follows 1-2 step verbal commands with good accuracy    Extremity Assessment (includes Sensation/Coordination)   Upper Extremity Assessment: Overall WFL for tasks assessed RUE Deficits / Details: Unable to maintain position of UE in space against gravity, AROM WFL at elbow, wrist and digits LUE Deficits / Details: Unable to maintain position of UE in space against gravity, AROM WFL at elbow, wrist and digits  Lower Extremity Assessment: Defer to PT evaluation RLE Deficits / Details: 2+/5 knee extension bilat; 2/5 hip abd, hip flex, DF/PF (R groin incision from procedure yesterday) RLE: Unable to fully assess due to pain LLE Deficits / Details: 2+/5 knee extension bilat; 2/5 hip abd, hip flex, DF/PF     ADLs   Overall ADL's : Needs assistance/impaired Grooming: Set up, Sitting Grooming Details (indicate cue type and reason): Mod A and cues for initiation/continuation to wash face seated on BSC. Upper Body Bathing: Sitting, Set up Upper Body Bathing Details (indicate cue type and reason): Able to wash chest, arms and abdomen with set-up assist. Lower Body Bathing: Maximal assistance Lower Body Bathing Details (indicate cue type and reason): Max A for time management. Patient limited by fatigue. Upper Body Dressing : Sitting, Set up Upper Body Dressing Details (indicate cue type and reason): Donned anterior hospital gown seated EOB. Lower Body Dressing: Moderate assistance Lower Body Dressing Details (indicate cue type and reason): Able to doff/don bilateral footwear with increased time/effort in figure-4 position. Cues for rest breaks. Toilet Transfer: Moderate  assistance, BSC, Stand-pivot, RW Toilet Transfer Details (  indicate cue type and reason): assist to pivot to [facility]. Pt very SOB and fatigued. Toileting- Clothing Manipulation and Hygiene: Maximal assistance, Sit to/from stand Toileting - Clothing Manipulation Details (indicate cue type and reason): Supervision A with use of urinal. Functional mobility during ADLs: Moderate assistance, Rolling walker General ADL Comments: Patient continues to be limited by generalized weakness, decreased activity tolerance and decreased balance     Mobility   Overal bed mobility: Needs Assistance Bed Mobility: Rolling, Sidelying to Sit Rolling: Min guard Sidelying to sit: Min guard Supine to sit: Max assist, +2 for physical assistance Sit to supine: Max assist General bed mobility comments: Wife assisted with transfer to EOB     Transfers   Overall transfer level: Needs assistance Equipment used: Rolling walker (2 wheeled) Transfers: Sit to/from Stand Sit to Stand: Min guard, Min assist Stand pivot transfers: Min assist General transfer comment: Min guard from bed for safety; min A and increased time from lower chair     Ambulation / Gait / Stairs / Wheelchair Mobility   Ambulation/Gait Ambulation/Gait assistance: Herbalist (Feet): 40 Feet (40'x2) Assistive device: Rolling walker (2 wheeled) Gait Pattern/deviations: Step-to pattern, Decreased stride length General Gait Details: Ambulated 40' x 2 but with DOE of 4/4 and requiring seated rest breaks. Min cues for controlled breathing and posture/RW proximity Gait velocity: decreased     Posture / Balance Dynamic Sitting Balance Sitting balance - Comments: Pt donning shorts at EOB Balance Overall balance assessment: Needs assistance Sitting-balance support: No upper extremity supported, Feet supported Sitting balance-Leahy Scale: Good Sitting balance - Comments: Pt donning shorts at EOB Standing balance-Leahy Scale: Poor Standing  balance comment: Reliant on BUE support on RW.     Special needs/care consideration CIWA precautions Contact precautions    Previous Home Environment  Living Arrangements: Spouse/significant other  Lives With: Spouse Available Help at Discharge: Family, Available 24 hours/day (wife arranging 24/7 supervision) Type of Home: Other(Comment) (38 foot RV) Home Layout: One level Home Access: Stairs to enter [employer] of Steps: 5 Bathroom Shower/Tub: Multimedia programmer: Standard Bathroom Accessibility: Yes How Accessible: Accessible via walker [place]: No   Discharge Living Setting Plans for Discharge Living Setting: Patient's home, Other (Comment) (38 foot RV trailer) Type of Home at Discharge: Other (Comment) Discharge Home Layout: One level Discharge Home Access: Stairs to enter Entrance Stairs-Number of Steps: 5 Discharge Bathroom Shower/Tub: Walk-in shower Discharge Bathroom Toilet: Standard Discharge Bathroom Accessibility: Yes How Accessible: Accessible via walker Does the patient have any problems obtaining your medications?: No   Social/Family/Support Systems Patient Roles: Spouse, Parent (Travel RN) Contact Information: wife, AJ Anticipated Caregiver: wife and family Anticipated Caregiver's Contact Information: see above Ability/Limitations of Caregiver: wife works [employer] as Emergency planning/management officer Availability: 24/7 Discharge Plan Discussed with Primary Caregiver: Yes Is Caregiver In Agreement with Plan?: Yes Does Caregiver/Family have Issues with Lodging/Transportation while Pt is in Rehab?: No   Goals Patient/Family Goal for Rehab: Mod I to supervision with PT and OT Expected length of stay: ELOS 10 to 12 days Pt/Family Agrees to Admission and willing to participate: Yes Program Orientation Provided & Reviewed with Pt/Caregiver Including Roles  & Responsibilities: Yes   Decrease burden of Care through IP rehab admission: n/a    Possible need for SNF placement upon discharge: not anticipated   Patient Condition: I have reviewed medical records from [facility] , spoken with CM, and patient and spouse. I met with patient at the bedside  for inpatient rehabilitation assessment.  Patient will benefit from ongoing PT and OT, can actively participate in 3 hours of therapy a day 5 days of the week, and can make measurable gains during the admission.  Patient will also benefit from the coordinated team approach during an Inpatient Acute Rehabilitation admission.  The patient will receive intensive therapy as well as Rehabilitation physician, nursing, social worker, and care management interventions.  Due to bladder management, bowel management, safety, skin/wound care, disease management, medication administration, pain management, and patient education the patient requires 24 hour a day rehabilitation nursing.  The patient is currently min assist overall with mobility and basic ADLs.  Discharge setting and therapy post discharge at home with outpatient is anticipated.  Patient has agreed to participate in the Acute Inpatient Rehabilitation Program and will admit today.   Preadmission Screen Completed By:  Cleatrice Burke, 12/13/2020 12:20 PM ______________________________________________________________________   Discussed status with Dr. Dagoberto Ligas on  12/13/2020 at 1221 and received approval for admission today.   Admission Coordinator:  Cleatrice Burke, RN, time  1221 Date  12/13/2020    Assessment/Plan: Diagnosis: Does the need for close, 24 hr/day Medical supervision in concert with the patient's rehab needs make it unreasonable for this patient to be served in a less intensive setting? Yes Co-Morbidities requiring supervision/potential complications: gastric bypass, pancreatitis; s/p CIWA, encephalopathy, SBO and asp pneumonia off ABX- MRSA precautions Due to bladder management, bowel management, safety,  skin/wound care, disease management, medication administration, pain management, and patient education, does the patient require 24 hr/day rehab nursing? Yes Does the patient require coordinated care of a physician, rehab nurse, PT, OT, and SLP to address physical and functional deficits in the context of the above medical diagnosis(es)? Yes Addressing deficits in the following areas: balance, endurance, locomotion, strength, transferring, bathing, dressing, feeding, grooming, toileting, and cognition Can the patient actively participate in an intensive therapy program of at least 3 hrs of therapy 5 days a week? Yes The potential for patient to make measurable gains while on inpatient rehab is good Anticipated functional outcomes upon discharge from inpatient rehab: modified independent and supervision PT, modified independent and supervision OT, n/a SLP Estimated rehab length of stay to reach the above functional goals is: 10-12 days Anticipated discharge destination: Home 10. Overall Rehab/Functional Prognosis: good     MD Signature:           Revision History                                    Note Details  Jan Fireman, MD File Time 12/13/2020 12:59 PM  Author Type Physician Status Signed  Last Editor Courtney Heys, MD Service Physical Medicine and [place] # [id number] Admit Date 12/13/2020

## 2020-12-13 NOTE — Plan of Care (Signed)
  Problem: Education: Goal: Knowledge of General Education information will improve Description: Including pain rating scale, medication(s)/side effects and non-pharmacologic comfort measures 12/13/2020 1247 by Grover Canavan, RN Outcome: Adequate for Discharge 12/13/2020 1245 by Grover Canavan, RN Outcome: Progressing   Problem: Health Behavior/Discharge Planning: Goal: Ability to manage health-related needs will improve 12/13/2020 1247 by Grover Canavan, RN Outcome: Adequate for Discharge 12/13/2020 1245 by Grover Canavan, RN Outcome: Progressing   Problem: Clinical Measurements: Goal: Ability to maintain clinical measurements within normal limits will improve 12/13/2020 1247 by Grover Canavan, RN Outcome: Adequate for Discharge 12/13/2020 1245 by Grover Canavan, RN Outcome: Progressing Goal: Will remain free from infection 12/13/2020 1247 by Grover Canavan, RN Outcome: Adequate for Discharge 12/13/2020 1245 by Grover Canavan, RN Outcome: Progressing Goal: Diagnostic test results will improve 12/13/2020 1247 by Grover Canavan, RN Outcome: Adequate for Discharge 12/13/2020 1245 by Grover Canavan, RN Outcome: Progressing Goal: Respiratory complications will improve 12/13/2020 1247 by Grover Canavan, RN Outcome: Adequate for Discharge 12/13/2020 1245 by Grover Canavan, RN Outcome: Progressing Goal: Cardiovascular complication will be avoided 12/13/2020 1247 by Grover Canavan, RN Outcome: Adequate for Discharge 12/13/2020 1245 by Grover Canavan, RN Outcome: Progressing   Problem: Activity: Goal: Risk for activity intolerance will decrease 12/13/2020 1247 by Grover Canavan, RN Outcome: Adequate for Discharge 12/13/2020 1245 by Grover Canavan, RN Outcome: Progressing   Problem: Nutrition: Goal: Adequate nutrition will be maintained 12/13/2020 1247 by Grover Canavan, RN Outcome: Adequate for Discharge 12/13/2020 1245 by Grover Canavan, RN Outcome: Progressing   Problem: Coping: Goal: Level of anxiety will  decrease 12/13/2020 1247 by Grover Canavan, RN Outcome: Adequate for Discharge 12/13/2020 1245 by Grover Canavan, RN Outcome: Progressing   Problem: Elimination: Goal: Will not experience complications related to bowel motility 12/13/2020 1247 by Grover Canavan, RN Outcome: Adequate for Discharge 12/13/2020 1245 by Grover Canavan, RN Outcome: Progressing Goal: Will not experience complications related to urinary retention 12/13/2020 1247 by Grover Canavan, RN Outcome: Adequate for Discharge 12/13/2020 1245 by Grover Canavan, RN Outcome: Progressing   Problem: Pain Managment: Goal: General experience of comfort will improve 12/13/2020 1247 by Grover Canavan, RN Outcome: Adequate for Discharge 12/13/2020 1245 by Grover Canavan, RN Outcome: Progressing   Problem: Safety: Goal: Ability to remain free from injury will improve 12/13/2020 1247 by Grover Canavan, RN Outcome: Adequate for Discharge 12/13/2020 1245 by Grover Canavan, RN Outcome: Progressing   Problem: Skin Integrity: Goal: Risk for impaired skin integrity will decrease 12/13/2020 1247 by Grover Canavan, RN Outcome: Adequate for Discharge 12/13/2020 1245 by Grover Canavan, RN Outcome: Progressing   Problem: Safety: Goal: Non-violent Restraint(s) 12/13/2020 1247 by Grover Canavan, RN Outcome: Adequate for Discharge 12/13/2020 1245 by Grover Canavan, RN Outcome: Progressing

## 2020-12-13 NOTE — Discharge Summary (Addendum)
Family Medicine Teaching [facility] Discharge Summary  Patient name: Ernest Rodgers Medical record number: [id number] Date of birth: 08-04-1970 Age: 50 y.o. Gender: male Date of Admission: 11/26/2020  Date of Discharge: 9/1 Admitting Physician: Darral Dash, DO  Primary Care Provider: Pcp, No Consultants: pulm, gen surg, GI (all s/o)  Indication for Hospitalization: Abdominal Pain   Discharge Diagnoses/Problem List:  Active Problems:   Acute pancreatitis   Acute kidney injury (HCC)   Alcohol withdrawal syndrome (HCC)   Hyperbilirubinemia   Hematuria   Abdominal pain   Acute respiratory distress   Somnolence   Abnormal CT of the abdomen   Physical deconditioning   Dyspnea   Disposition: CIR  Discharge Condition: Stable  Discharge Exam:  Blood pressure (!) 136/92, pulse (!) 107, temperature 98 F (36.7 C), temperature source Oral, resp. rate 16, height 5\' 10"  (1.778 m), weight 124.3 kg, SpO2 99 %. Physical Exam Vitals reviewed.  Constitutional:      General: He is not in acute distress.    Appearance: He is well-developed. He is not ill-appearing.  Cardiovascular:     Rate and Rhythm: Normal rate and regular rhythm.  Pulmonary:     Effort: Pulmonary effort is normal. No respiratory distress.     Breath sounds: Normal breath sounds.  Abdominal:     General: There is no distension.     Palpations: Abdomen is soft.     Comments: Some tenderness over left ribs. BS slightly hyperactive  Skin:    General: Skin is warm and dry.  Neurological:     Mental Status: He is alert.  Psychiatric:        Mood and Affect: Mood normal.        Behavior: Behavior normal.     Brief Hospital Course:  Ernest Rodgers is a 50 y.o. male who presented with acute EtOH pancreatitis and was briefly intubated in the ICU secondary to hypoxia and was subsequently extubated on 8/23.  He developed altered mental status and severe hallucinations and agitation after extubation. He was also found to  have aspiration pneumonitis for which he completed a course of linezolid and CTX in addition to SBO that was therapeutically treated.  Splenic hematoma was found that was embolized by IR on 8/25 caused by either previous fall injury versus pancreatitis.  PMH is significant for alcohol use disorder, hypertension, GERD.  Hospital course outlined below.  EtOH pancreatitis Originally the patient woke up at home with epigastric pain and radiation to the back. He was given Dilaudid, Zofran, and a normal saline bolus in the emergency room.  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 GI saw the pt and suggested to continue with supportive care for his pancreatitis in addition to alcohol avoidance. We continued with aggressive fluid hydration, Dilaudid for pain, Zofran for nausea and vomiting.  Patient received supportive care throughout his stay in the ICU and when he was brought back to the floor.  Recovery from pancreatitis had multiple complications, but he has been able to advance his eating and pain has been well controlled.   Alchohol Withdrawal  Patient originally drinks 2 cocktails a night with 6-7 drinks on his days off of work.  While on the floor he did have tremors. Patient also had delirium and hallucinations onset while in the ICU, while most likely is multifactorial, it definitely is contributed to by patient's alcohol use.  CIWA scores were taken throughout the admission.  He received Ativan when  he needed it for withdrawal symptoms.  CIWA scores were discontinued on 12/12/2020 when patient consistently scored 0 on CIWA in was outside of the window for withdrawal.  Social work has seen him and the patient is amenable to taking classes to discontinue alcohol use with his wife. Likely will follow-up for alcohol use disorder and have provided information for naltrexone as an option to help with cravings.   Acute encephalopathy  Flash Pulmonary  Edema Patient was evaluated with rapid RN on 8/18.  Patient was responsive to painful stimuli and it was suspected that he had encephalopathy due to worsening alcohol withdrawal as he had acutely rising CIWA scores overnight.  There was suspect of flash pulmonary edema that was a complication of his pancreatitis and underlying diastolic dysfunction with an element of hepatic congestion CCM took over the patient and he was admitted to ICU.  The patient was subsequently intubated on 11/29/2020-12/03/2020. During intubation, he was found to have a SBO.   ICU course was complicated by development of aspiration PNA and later by development of SBO. Surgery evaluated the patient and decided that he had adequate bowel movements, did not continue with any surgical intervention at that time for SBO. Patient was also found to have a splenic hematoma while in the ICU and had embolization of the spleen via IR (discussed below).    Aspiration pneumonitis Patient had presence of MRSA on sputum culture.  He has completed a course of linezolid and CTX.  No new symptoms at this time. SPO2 has been in the upper 90s on room air.  Splenic hematoma acute blood loss anemia Patient is s/p embolization by IR performed on 8/25.  Patient has since had hemoglobin 7.4 > 7.6 > 7.8 > 8.7> 8.2 and has been stable.  Blood smear was obtained and showed macrocytic anemia. He received meningococcal and Hib vaccines in hospital.  He still needs to get Shingrix and PCV 20 outpatient after leaving the hospital.     Issues for Follow Up:  Ensure he has follow up for vaccines for asplenia (PCV 20 and Shingrix) Encourage alcohol cessation. Consider starting Naltrexone on discharge to help with alcohol cessation. Naltrexone should not be used with opioids. May also consider acamprosate.  Follow up with PCP for continued alcohol use disorder treatment  Follow up with PCP to monitor Hgb level in setting of macrocytic anemia  Monitor pain,  adjust as needed for pain control.    Significant Procedures: Splenic embolization, s/p intubation   Significant Labs and Imaging:  Recent Labs  Lab 12/11/20 0340 12/12/20 0044 12/13/20 0629  WBC 9.6 8.7 8.0  HGB 8.1* 8.7* 8.2*  HCT 25.2* 25.5* 26.1*  PLT 281 259 249   Recent Labs  Lab 12/07/20 0414 12/08/20 0334 12/09/20 0412 12/10/20 0334 12/11/20 0340 12/12/20 0044  NA 149* 143 140 139 137 136  K 3.5 3.6 3.7 3.8 3.8 3.8  CL 115* 113* 107 111 107 108  CO2 23 23 22 22  21* 18*  GLUCOSE 213* 127* 172* 151* 150* 109*  BUN 21* 13 11 13 13 12   CREATININE 0.78 0.62 0.64 0.64 0.61 0.62  CALCIUM 8.7* 8.1* 8.5* 8.3* 8.3* 7.9*  MG 2.1 2.0 2.0 1.9  --   --   PHOS 2.7 3.5 3.3 3.3  --   --   ALKPHOS  --   --   --  104 125 117  AST  --   --   --  71* 54* 40  ALT  --   --   --  53* 57* 47*  ALBUMIN  --   --   --  1.9* 1.9* 2.1*    11/26/20 EKG: Normal sinus rhythm, 61 bpm, no acute ST or T wave changes noted, no QT prolongation  11/28/20 Echo  Left Ventricle: Left ventricular ejection fraction, by estimation, is 60  to 65%. The left ventricle has normal function. The left ventricle has no  regional wall motion abnormalities. The left ventricular internal cavity  size was normal in size. There is   mild concentric left ventricular hypertrophy. Left ventricular diastolic  parameters are consistent with Grade I diastolic dysfunction (impaired  relaxation). Normal left ventricular filling pressure.   12/12/20 CTPA  IMPRESSION: No evidence of pulmonary emboli. Bilateral lower lobe consolidation with small left pleural effusion stable from the prior exam. Changes consistent with the known subcapsular splenic hematoma.   12/11/20 chest x-ray: IMPRESSION: Low long volumes with streaky left basilar opacities, most likely atelectasis. Pneumonia is not excluded.   CT abd and pelvis 12/05/20 IMPRESSION: 1. New large subcapsular hematoma associated with the spleen. Adjacent to  this there is hemorrhagic fluid in the peritoneal cavity (hemoperitoneum) tracking down the left pericolic gutter extending into the pelvis. Although active extravasation is not confidently identified on today's examination, this hematoma likely rapidly expanded, and the possibility of active bleeding should be considered. 2. Progression of pancreatitis with increasing inflammatory changes surrounding the pancreas and possible areas of hypoenhancement in the body and tail of the pancreas which could reflect developing pancreatic necrosis. Small peripancreatic pseudocysts are also noted.    Results/Tests Pending at Time of Discharge: None   Discharge Medications:  Allergies as of 12/13/2020   No Known Allergies      Medication List     STOP taking these medications    cyclobenzaprine 10 MG tablet Commonly known as: FLEXERIL   predniSONE 10 MG (21) Tbpk tablet Commonly known as: STERAPRED UNI-PAK 21 TAB       TAKE these medications    acetaminophen 325 MG tablet Commonly known as: TYLENOL Take 2 tablets (650 mg total) by mouth every 6 (six) hours.   ascorbic acid 1000 MG tablet Commonly known as: VITAMIN C Take 1 tablet (1,000 mg total) by mouth daily. Start taking on: December 14, 2020   FLUoxetine 20 MG tablet Commonly known as: PROZAC Take 20 mg by mouth every morning.   folic acid 1 MG tablet Commonly known as: FOLVITE Take 1 tablet (1 mg total) by mouth daily. Start taking on: December 14, 2020   furosemide 20 MG tablet Commonly known as: LASIX Take 20 mg by mouth every morning.   lisinopril 10 MG tablet Commonly known as: ZESTRIL Take 10 mg by mouth daily.   metoprolol succinate 50 MG 24 hr tablet Commonly known as: TOPROL-XL Take 50 mg by mouth daily.   omeprazole 20 MG capsule Commonly known as: PRILOSEC Take 20 mg by mouth daily.   ondansetron 8 MG tablet Commonly known as: ZOFRAN Take 8 mg by mouth every 8 (eight) hours as needed for  nausea or vomiting.   oxyCODONE 5 MG immediate release tablet Commonly known as: Oxy IR/ROXICODONE Take 1 tablet (5 mg total) by mouth every 6 (six) hours as needed for severe pain.   thiamine 100 MG tablet Take 1 tablet (100 mg total) by mouth daily. Start taking on: December 14, 2020   vitamin A 3 MG (10000 UNITS) capsule Take 2 capsules (20,000 Units total) by mouth daily for 6 days. Start taking  on: December 14, 2020   zinc sulfate 220 (50 Zn) MG capsule Take 1 capsule (220 mg total) by mouth daily. Start taking on: December 14, 2020        Discharge Instructions: Please refer to Patient Instructions section of EMR for full details.  Patient was counseled important signs and symptoms that should prompt return to medical care, changes in medications, dietary instructions, activity restrictions, and follow up appointments.   Follow-Up Appointments:  Follow-up Information     Primary Care Provider Follow up.   Why: Please establish care with a primary care provider or call your primary care provider to arrange follow up and get your additional vaccines after splenic emobilization.                Alfredo Martinez, MD 12/13/2020, 12:10 PM PGY-1, [facility]  I was personally present and performed or re-performed the history, physical exam and medical decision making activities of this service and have verified that the service and findings are accurately documented in the student's note.  Reece Leader, DO                  12/13/2020, 12:21 PM  PGY-2, [facility]

## 2020-12-13 NOTE — Progress Notes (Signed)
Inpatient Rehabilitation Admissions Coordinator  CIR bed is available to admit patient to today and Dr Zigmund Daniel is aware and in agreement to d/c to Cir today. I met with patient and his wife at bedside and will make the arrangements to admit today. Acute team and TOC made aware.  Danne Baxter, RN, MSN Rehab Admissions Coordinator [phone] 12/13/2020 10:52 AM

## 2020-12-13 NOTE — TOC Transition Note (Signed)
Transition of Care [facility]) - CM/SW Discharge Note   Patient Details  Name: Ernest Rodgers MRN: [id number] Date of Birth: 05/09/1970  [facility] of [facility] CM/SW Contact:  Leone Haven, RN Phone Number: 12/13/2020, 12:40 PM   Clinical Narrative:    Patient for dc to CIR.   Final next level of care: IP Rehab Facility Barriers to Discharge: No Barriers Identified   Patient Goals and CMS Choice Patient states their goals for this hospitalization and ongoing recovery are:: CIR      Discharge Placement                       Discharge Plan and Services   Discharge Planning Services: CM Consult                                 Social Determinants of Health (SDOH) Interventions     Readmission Risk Interventions No flowsheet data found.

## 2020-12-13 NOTE — Progress Notes (Signed)
Inpatient Rehabilitation Medication Review by a Pharmacist  A complete drug regimen review was completed for this patient to identify any potential clinically significant medication issues.  High Risk Drug Classes Is patient taking? Indication by Medication  Antipsychotic Yes Compazine for nausea, Prozac for MDD  Anticoagulant Yes Lovenox for VTE Prophx.  Antibiotic No   Opioid Yes Oxy for severe pain  Antiplatelet No   Hypoglycemics/insulin No   Vasoactive Medication Yes Lasix, lisinoprilm Toprol for h/o HTN  Chemotherapy No   Other No      Type of Medication Issue Identified Description of Issue Recommendation(s)  Drug Interaction(s) (clinically significant)     Duplicate Therapy     Allergy     No Medication Administration End Date     Incorrect Dose     Additional Drug Therapy Needed     Significant med changes from prior encounter (inform family/care partners about these prior to discharge).    Other       Clinically significant medication issues were identified that warrant physician communication and completion of prescribed/recommended actions by midnight of the next day:  No  Name of provider notified for urgent issues identified:   Provider Method of Notification:  NA  Pharmacist comments: None  Time spent performing this drug regimen review (minutes):  10 min  Jhair Witherington S. Merilynn Finland, PharmD, BCPS Clinical Staff Pharmacist [email or web address] Pasty Spillers 12/13/2020 3:23 PM

## 2020-12-13 NOTE — H&P (Signed)
Physical Medicine and Rehabilitation Admission H&P        Chief Complaint  Patient presents with   Debility       HPI: Ernest Rodgers is a 50 year old male with history of HTN, renal calculi, gastric bypass, MDD, , ETOH abuse who was admitted on 11/26/20 wit abdominal pain and reports of fall 2 days PTA with rib pain. Work up revealed acute pancreatitis with left 6-8th rib fracture as well as hepatic steatosis. He was made NPO and treated with fluids and started on CIWA protocol. He was found unresponsive and hypoxic due to aspiration pneumonitis requiring intubation on 08/18. He was started on IV anbitiocs for staph aureus in sputum and had issues with agitation requiring precedex.   He continued to have fevers and antibiotics changed to Linezolid on 08/21.    He was started on tube feeds for nutritional support but developed abdominal distension due to distal SBO and Dr. Derrell Lolling recommended NGT to suction with serial X rays.   He was extubated on 08/23 but noted to have severe hallucination with agitation, fever as well as tachycardia overnight as well as drop in Hgb from 11-->9.2.  CTA chest was  negative for PE but he was found to have new large subcapsular splenic hematoma with hemoperitoneum and concerns of active bleeding, progression of pancreatitis with small peripancreatic pseudocyst and question of pancreatic necrosis and resolving BLL PNA.  IR consulted for input and recommended monitoring for 24 hours with serial CBC but patient continued to have issues with hypotension and progressive drop in H/H despite 1 unit PRBC. He underwent splenic artery embolization by Dr. Lowella Dandy on 08/25 and required 3 total units of PRBC.    He was started on TPN for nutritional support and advanced to clears on 08/26. He continued t Dr. Orvan Falconer consulted for input on pancreatitis and recommended supportive care with fluids, antibiotics and die advancement as tolerated.  Mentation has improved and he has been  advanced to regular diet. He developed severe hypoxia on 08/31 and repeat CTA chest was negative for PE and should stable BLL consolidation. He was started on low dose lasix but continues to have significant DOE with tachypnea with activity. Therapy ongoing and patient limited by weakness, DOE requiring multiple rest breaks with minimal activity.CIR recommended due to functional decline.       Review of Systems  Constitutional:  Negative for chills and fever.  HENT:  Negative for hearing loss and tinnitus.   Eyes:  Negative for blurred vision and double vision.  Respiratory:  Positive for shortness of breath.   Cardiovascular:  Positive for chest pain. Negative for leg swelling.  Gastrointestinal:  Negative for abdominal pain, diarrhea, heartburn and nausea.  Genitourinary:  Negative for dysuria.  Musculoskeletal:  Negative for back pain, joint pain and myalgias.  Skin:  Negative for rash.  Neurological:  Positive for weakness. Negative for dizziness and headaches.  All other systems reviewed and are negative.         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 From [facility] and wife currently working. He is a former smoker--quit 04/2009. He has never used smokeless tobacco. He drinks alcohol almost daily.      Allergies: No Known Allergies           Medications Prior to Admission  Medication Sig Dispense Refill   cyclobenzaprine (FLEXERIL) 10 MG tablet Take 1 tablet (10 mg total) by mouth 2 (two) times daily as  needed for muscle spasms. (Patient taking differently: Take 10 mg by mouth every 12 (twelve) hours.) 20 tablet 0   FLUoxetine (PROZAC) 20 MG tablet Take 20 mg by mouth every morning.       furosemide (LASIX) 20 MG tablet Take 20 mg by mouth every morning.       lisinopril (ZESTRIL) 10 MG tablet Take 10 mg by mouth daily.       metoprolol succinate (TOPROL-XL) 50 MG 24 hr tablet Take 50 mg by mouth daily.       omeprazole (PRILOSEC) 20 MG capsule Take 20 mg by mouth daily.       ondansetron (ZOFRAN) 8 MG tablet Take 8 mg by mouth every 8 (eight) hours as needed for nausea or vomiting.       predniSONE (STERAPRED UNI-PAK 21 TAB) 10 MG (21) TBPK tablet Take by mouth daily. Take 6 tabs by mouth daily  for 2 days, then 5 tabs for 2 days, then 4 tabs for 2 days, then 3 tabs for 2 days, 2 tabs for 2 days, then 1 tab by mouth daily for 2 days 42 tablet 0      Drug Regimen Review  Drug regimen was reviewed and remains appropriate with no significant issues identified   Home: Home Living Family/patient expects to be discharged to:: Private residence Living Arrangements: Spouse/significant other Available Help at Discharge: Family, Available 24 hours/day (wife arranging 24/7 supervision) Type of Home: Other(Comment) (38 foot RV) Home Access: Stairs to enter Entrance Stairs-Number of Steps: 5 Home Layout: One level Bathroom Shower/Tub: Health visitor: Pharmacist, community: Yes Home Equipment: None  Lives With: Spouse   Functional History: Prior Function Level of Independence: Independent Comments: pt works as a travel Charity fundraiser at [employer]; wife works on 2[employer]   Functional Status:  Mobility: Bed Mobility Overal bed mobility: Needs Assistance Bed Mobility: Rolling, Sidelying to [employer]: Min guard Sidelying to sit: Min guard Supine to sit: Max assist, +2 for physical assistance Sit to supine: Max assist General bed mobility comments: Wife assisted with transfer to  EOB Transfers Overall transfer level: Needs assistance Equipment used: Rolling walker (2 wheeled) Transfers: Sit to/from Stand Sit to Stand: Min guard, Min assist Stand pivot transfers: Min assist General transfer comment: Min guard from bed for safety; min A and increased time from lower chair Ambulation/Gait Ambulation/Gait assistance: Min assist Gait Distance (Feet): 40 Feet (40'x2) Assistive device: Rolling walker (2 wheeled) Gait Pattern/deviations: Step-to pattern, Decreased stride length General Gait Details: Ambulated 40' x 2 but with DOE of 4/4 and requiring seated rest breaks. Min cues for controlled breathing and posture/RW proximity Gait velocity: decreased   ADL: ADL Overall ADL's : Needs assistance/impaired Grooming: Set up, Sitting Grooming Details (indicate cue type and reason): Mod A and cues for initiation/continuation to wash face  seated on BSC. Upper Body Bathing: Sitting, Set up Upper Body Bathing Details (indicate cue type and reason): Able to wash chest, arms and abdomen with set-up assist. Lower Body Bathing: Maximal assistance Lower Body Bathing Details (indicate cue type and reason): Max A for time management. Patient limited by fatigue. Upper Body Dressing : Sitting, Set up Upper Body Dressing Details (indicate cue type and reason): Donned anterior hospital gown seated EOB. Lower Body Dressing: Moderate assistance Lower Body Dressing Details (indicate cue type and reason): Able to doff/don bilateral footwear with increased time/effort in figure-4 position. Cues for rest breaks. Toilet Transfer: Moderate assistance, BSC, Stand-pivot, RW Toilet Transfer Details (indicate cue type and reason): assist to pivot to [facility]. Pt very SOB and fatigued. Toileting- Clothing Manipulation and Hygiene: Maximal assistance, Sit to/from stand Toileting - Clothing Manipulation Details (indicate cue type and reason): Supervision A with use of urinal. Functional mobility during  ADLs: Moderate assistance, Rolling walker General ADL Comments: Patient continues to be limited by generalized weakness, decreased activity tolerance and decreased balance   Cognition: Cognition Overall Cognitive Status: Within Functional Limits for tasks assessed Orientation Level: Oriented X4 Cognition Arousal/Alertness: Awake/alert Behavior During Therapy: WFL for tasks assessed/performed Overall Cognitive Status: Within Functional Limits for tasks assessed Area of Impairment: Orientation, Attention, Following commands, Memory, Safety/judgement, Problem solving Orientation Level: Disoriented to, Place, Time, Situation Current Attention Level: Focused Memory: Decreased short-term memory Following Commands: Follows one step commands inconsistently, Follows one step commands with increased time Safety/Judgement: Decreased awareness of safety, Decreased awareness of deficits Problem Solving: Slow processing, Decreased initiation, Difficulty sequencing, Requires verbal cues, Requires tactile cues General Comments: Cognition continues to improve; A&Ox4; follows 1-2 step verbal commands with good accuracy     Blood pressure (!) 136/92, pulse (!) 107, temperature 98 F (36.7 C), temperature source Oral, resp. rate 16, height 5\' 10"  (1.778 m), weight 124.3 kg, SpO2 99 %. Physical Exam Vitals and nursing note reviewed. Exam conducted with a chaperone present.  Constitutional:      Appearance: He is well-developed. He is obese.     Comments: Obese male. NAD; sitting EOB, but his posture wilted after 5-10 minutes and laid down; wife at bedside.   HENT:     Head: Normocephalic and atraumatic.     Right Ear: External ear normal.     Left Ear: External ear normal.     Nose: Nose normal. No congestion.     Mouth/Throat:     Mouth: Mucous membranes are moist.     Pharynx: Oropharynx is clear. No pharyngeal swelling.     Comments: Scab on lower lip due to DTI from vent tubing. Fried blood Eyes:      General:        Right eye: No discharge.        Left eye: No discharge.     Pupils: Pupils are equal, round, and reactive to light.  Cardiovascular:     Rate and Rhythm: Normal rate and regular rhythm.     Heart sounds: Normal heart sounds. No murmur heard.   No gallop.  Pulmonary:     Comments: TTP over L lateral ribs and R lower posterior ribs Otherwise, CTA B/L- no W/R/R- good air movement     Abdominal:     General: Abdomen is protuberant.     Comments: Protuberant, soft, NT, ND, hypoactive  Musculoskeletal:     Cervical back: Normal range of motion. No rigidity.     Comments: UE- biceps 4+/5, triceps 4+/5,  WE 5-/5, grip 5-/5, and FA 5-/5 B/L LE- HF 4/5, KE 4/5, KF 4/5, DF and PF 4+/5 B/L   Skin:    Comments: B/L forearm IV's- L doesn't work per pt- R OK- both look well Head shaved No skin breakdown on backside or feet  Neurological:     Mental Status: He is alert and oriented to person, place, and time.     Comments: Verbose with rapid/slurred speech at times. Needs occasional redirection but able to follow commands without difficulty.  Intact to light touch in all 4 extremities   Psychiatric:        Mood and Affect: Mood normal.        Behavior: Behavior normal.     Comments: tangential      Lab Results Last 48 Hours        Results for orders placed or performed during the hospital encounter of 11/26/20 (from the past 48 hour(s))  Glucose, capillary     Status: Abnormal    Collection Time: 12/11/20  3:38 PM  Result Value Ref Range    Glucose-Capillary 110 (H) 70 - 99 mg/dL      Comment: Glucose reference range applies only to samples taken after fasting for at least 8 hours.  Glucose, capillary     Status: Abnormal    Collection Time: 12/11/20  9:37 PM  Result Value Ref Range    Glucose-Capillary 115 (H) 70 - 99 mg/dL      Comment: Glucose reference range applies only to samples taken after fasting for at least 8 hours.    Comment 1 Notify RN      Comment 2  Document in Chart    CBC     Status: Abnormal    Collection Time: 12/12/20 12:44 AM  Result Value Ref Range    WBC 8.7 4.0 - 10.5 K/uL    RBC 2.46 (L) 4.22 - 5.81 MIL/uL    Hemoglobin 8.7 (L) 13.0 - 17.0 g/dL    HCT 63.3 (L) 35.4 - 52.0 %    MCV 103.7 (H) 80.0 - 100.0 fL    MCH 35.4 (H) 26.0 - 34.0 pg    MCHC 34.1 30.0 - 36.0 g/dL    RDW 56.2 (H) 56.3 - 15.5 %    Platelets 259 150 - 400 K/uL    nRBC 3.7 (H) 0.0 - 0.2 %      Comment: Performed at [facility], [address], [place], [place] [address]  Comprehensive metabolic panel     Status: Abnormal    Collection Time: 12/12/20 12:44 AM  Result Value Ref Range    Sodium 136 135 - 145 mmol/L    Potassium 3.8 3.5 - 5.1 mmol/L    Chloride 108 98 - 111 mmol/L    CO2 18 (L) 22 - 32 mmol/L    Glucose, Bld 109 (H) 70 - 99 mg/dL      Comment: Glucose reference range applies only to samples taken after fasting for at least 8 hours.    BUN 12 6 - 20 mg/dL    Creatinine, Ser 4.28 0.61 - 1.24 mg/dL    Calcium 7.9 (L) 8.9 - 10.3 mg/dL    Total Protein 5.3 (L) 6.5 - 8.1 g/dL    Albumin 2.1 (L) 3.5 - 5.0 g/dL    AST 40 15 - 41 U/L    ALT 47 (H) 0 - 44 U/L    Alkaline Phosphatase 117 38 - 126 U/L  Total Bilirubin 1.5 (H) 0.3 - 1.2 mg/dL    GFR, Estimated >24 >09 mL/min      Comment: (NOTE) Calculated using the CKD-EPI Creatinine Equation (2021)      Anion gap 10 5 - 15      Comment: Performed at [facility], [address], [place], [place] [address]  Glucose, capillary     Status: Abnormal    Collection Time: 12/12/20  6:31 AM  Result Value Ref Range    Glucose-Capillary 120 (H) 70 - 99 mg/dL      Comment: Glucose reference range applies only to samples taken after fasting for at least 8 hours.    Comment 1 Notify RN      Comment 2 Document in Chart    Glucose, capillary     Status: Abnormal    Collection Time: 12/12/20  4:51 PM  Result Value Ref Range    Glucose-Capillary 112 (H) 70 - 99 mg/dL      Comment:  Glucose reference range applies only to samples taken after fasting for at least 8 hours.  Glucose, capillary     Status: Abnormal    Collection Time: 12/12/20  9:30 PM  Result Value Ref Range    Glucose-Capillary 123 (H) 70 - 99 mg/dL      Comment: Glucose reference range applies only to samples taken after fasting for at least 8 hours.    Comment 1 Notify RN      Comment 2 Document in Chart    Glucose, capillary     Status: Abnormal    Collection Time: 12/13/20  6:20 AM  Result Value Ref Range    Glucose-Capillary 123 (H) 70 - 99 mg/dL      Comment: Glucose reference range applies only to samples taken after fasting for at least 8 hours.    Comment 1 Notify RN      Comment 2 Document in Chart    CBC     Status: Abnormal    Collection Time: 12/13/20  6:29 AM  Result Value Ref Range    WBC 8.0 4.0 - 10.5 K/uL    RBC 2.45 (L) 4.22 - 5.81 MIL/uL    Hemoglobin 8.2 (L) 13.0 - 17.0 g/dL    HCT 99.2 (L) 42.6 - 52.0 %    MCV 106.5 (H) 80.0 - 100.0 fL    MCH 33.5 26.0 - 34.0 pg    MCHC 31.4 30.0 - 36.0 g/dL    RDW 83.4 (H) 19.6 - 15.5 %    Platelets 249 150 - 400 K/uL    nRBC 0.8 (H) 0.0 - 0.2 %      Comment: Performed at [facility], [address] [place], [place] [address]       Imaging Results (Last 48 hours)  CT Angio Chest Pulmonary Embolism (PE) W or WO Contrast   Result Date: 12/12/2020 CLINICAL DATA:  Shortness of breath EXAM: CT ANGIOGRAPHY CHEST WITH CONTRAST TECHNIQUE: Multidetector CT imaging of the chest was performed using the standard protocol during bolus administration of intravenous contrast. Multiplanar CT image reconstructions and MIPs were obtained to evaluate the vascular anatomy. CONTRAST:  59mL OMNIPAQUE IOHEXOL 350 MG/ML SOLN COMPARISON:  CT from the previous day. FINDINGS: Cardiovascular: Thoracic aorta demonstrates atherosclerotic calcifications. The ascending aorta is mildly prominent at the upper limits of normal in size measuring 3.9 cm. Normal  tapering in the aortic arch is noted. No cardiac enlargement is seen. The pulmonary artery shows a  normal branching pattern bilaterally. No definitive filling defect to suggest pulmonary embolism is noted. Mediastinum/Nodes: Thoracic inlet is within normal limits. No sizable hilar or mediastinal adenopathy is noted. The esophagus as visualized is within normal limits. Lungs/Pleura: Lungs again demonstrate bilateral lower lobe consolidation with small left pleural effusion. The overall appearance is similar to that seen on the prior exam. No pneumothorax is identified. No sizable parenchymal nodule is seen. Upper Abdomen: Postsurgical changes are noted in the stomach. Prominence of the spleen with subcapsular hematoma is again identified. Musculoskeletal: Degenerative changes of the thoracic spine are noted. Review of the MIP images confirms the above findings. IMPRESSION: No evidence of pulmonary emboli. Bilateral lower lobe consolidation with small left pleural effusion stable from the prior exam. Changes consistent with the known subcapsular splenic hematoma. Aortic Atherosclerosis (ICD10-I70.0). Electronically Signed   By: Alcide CleverMark  Lukens M.D.   On: 12/12/2020 11:09             Medical Problem List and Rodgers: 1.  [facility]/intubation, splenic hematoma s/p embolization; and flash pulm edema             -patient may  shower             -ELOS/Goals: 10-14 days- supervision 2.  Antithrombotics: -DVT/anticoagulation:  Pharmaceutical: Lovenox             -antiplatelet therapy: N/A 3. Pain Management: Tylenol 650 mg qid --Oxycodone prn with close monitoring of GI symptoms 4. Mood: LCSW to follow for evalaution and support.              -antipsychotic agents: N/A 5. Neuropsych: This patient may be intermittently capable of making decisions on his own behalf. 6. Skin/Wound Care: Routine pressure relief measures.  7. Fluids/Electrolytes/Nutrition: Monitor  I/O. Check CMET in am.              --continue Ensure TID--will change to Ensure Max due to hyperglycemia (A1c- -5.5) 8. DOE: Encourage pulmonary toilet.  Question fluid overload--wt up by average of 15 lbs?              --Monitor for signs of overload and check daily standing weights   9. SBO/Ileus: Has resolved. Augment bowel program as indicated             --Keep K>4.0 and Mg>2.0 to prevent recurrence             --add Kdur for supplement as on lasix and K<4.0 10. HTN/Tachycardia: Monitor HR TID--on Toprol XL, Lisinopril and Furosemide.  11. Abnormal LFTs:Resolving--recheck in am 12. Acute blood loss anemia: Continue to monitor--especially with lovenox on board             --stable in 7-8 range.  13. ETOH abuse/Hepatic steatosis: On Thiamine and Folic acid.  14. Nose bleed: Monitor for recurrence.          Jacquelynn Creeamela S Love, PA-C 12/13/2020    I have personally performed a face to face diagnostic evaluation of this patient and formulated the key components of the Rodgers.  Additionally, I have personally reviewed laboratory data, imaging studies, as well as relevant notes and concur with the physician assistant's documentation above.   The patient's status has not changed from the original H&P.  Any changes in documentation from the acute care chart have been noted above.

## 2020-12-13 NOTE — Progress Notes (Signed)
Family Medicine Teaching Service Daily Progress Note Intern Pager: [phone]  Patient name: Ernest Rodgers Medical record number: [id number] Date of birth: [birth year] Age: 50 y.o. Gender: male  Primary Care Provider: Pcp, No Consultants: pulm, gen surg, GI (s/o) Code Status: Full  Pt Overview and Major Events to Date:  8/15 at [facility] as for acute pancreatitis 8/16 symptoms of alcohol withdrawal 8/18 became unresponsive on the floor hypoxic and was intubated in ICU 8/25 for ileus versus small bowel obstruction 8/23 extubated 8/24 evidence of perisplenic hematoma 8/25 splenic embolization by IR 8/25 transferred back to FPTS  Assessment and Plan:  Ernest Rodgers is a 50 year old male who presented with acute EtOH pancreatitis, was briefly intubated in the ICU and extubated subsequently on 8/23.  He had developed altered mental status with severe hallucinations and agitation after extubation while in the ICU.  Patient was also found to have aspiration pneumonitis and has completed a course of linezolid and ceftriaxone.  The patient has splenic hematoma that was embolized by IR on 8/25 that was caused by a fall versus pancreatitis.  Tachypnea and tachycardia, improving Patient has had intermittent episodes of tachycardia and tachypnea throughout the stay on the floor.  Full work-up including x-ray, EKG, and CTPA have all been negative and reassuring.  Today he states that his tachycardia and tachypnea has been improving.  He had 1 episode of tachycardia on most recent vitals obtained to 111.  -Has been working with PT/OT as tolerated -Monitor vitals -Work up has shown no concerning findings  -Stable for CIR placement   Aspiration pneumonitis noticed to be MRSA in the sputum while in the ICU.  Patient completed a course of linezolid and ceftriaxone.  He has been breathing on room air and satting in the upper 90s.  Splenic hematoma acute blood loss anemia likely in the setting of ACD Status  postembolization by IR on 8/25 making him functionally asplenic.  Hemoglobin trend has been stable. Updated CBC hgb is 8.2 (previous two 8.7 and 8.1) with MCV 106.5. Pt has declined lovenox ppx due to a nosebleed that he had yesterday. The bleed resolved with minimal blood loss.  -DVT prophylaxis has been started -Splenic vaccines as needed in the hospital have been ordered -Still needs PCV20 and Shingrex outpatient   SBO versus ileus GI was following and did not feel the need to have any surgical interventions.  Patient is tolerating a regular diet well. He had diarrhea over the course of the night likely 2/2 to his bowel regimen.  -GI signed off -Continue with regular diet -We have discontinued TPN -Bowel regimen made PRN  Elevated LFTs, resolving LFTs have been downtrending throughout course of admission -Last AST 40 and ALT 47  Hypertension Patient's blood pressure has been well controlled.  Home medications: Metoprolol succinate 50 mg 1 time daily and lisinopril 10 mg 1 time daily. One elevated [ressure 143/92 but other pressures have been in 120s/70-80s. -Patient has been started on home medications as described above  GERD Pantoprazole 40 mg daily  History of fall with acute nondisplaced left rib fractures Patient has been requiring Oxy 5 milligrams every 6 hours as needed.  We tried to decrease the amount of oxycodone, however he was in severe pain and required more medication.  No pain overnight as noted by night team.  -Currently continue with oxycodone 5 mg every 6 hours as needed -We will discuss other options of pain control  Acute metabolic encephalopathy, resolved Multifactorial in origin.  Patient denies  any hallucinations.  Depression Home medication: Fluoxetine 20 mg every morning Home medication has been restarted  Alcohol use disorder CIWA scores have been low.  Denies any evidence of alcohol withdrawal at this time. -Discussed starting naltrexone outpatient.   We cannot start naltrexone if patient is on opioids so we may consider acamprosate as well.  -Patient is amenable to starting treatment -Social work has seen the patient for alcohol use disorder; patient and his wife plan to sessions together to stop drinking as much alcohol  FEN/GI: Regular diet PPx: Lovenox Dispo: CIR pending placement. Stable for CIR  Subjective:  Patient reports that he did not get much sleep at night.  He still endorses rib pain and notes that he had diarrhea throughout the night.  He states that his difficulty breathing has improved with activity.  He is eager to go to inpatient rehab.  Objective: Temp:  [97.7 F (36.5 C)-98.3 F (36.8 C)] 98 F (36.7 C) (09/01 0818) Pulse Rate:  [87-111] 111 (09/01 0818) Resp:  [16-22] 16 (09/01 0818) BP: (103-143)/(72-92) 136/92 (09/01 1016) SpO2:  [96 %-100 %] 99 % (09/01 0818) Physical Exam Vitals reviewed.  Constitutional:      General: He is not in acute distress.    Appearance: He is well-developed. He is not ill-appearing.  Cardiovascular:     Rate and Rhythm: Normal rate and regular rhythm.  Pulmonary:     Effort: Pulmonary effort is normal. No respiratory distress.     Breath sounds: Normal breath sounds.  Abdominal:     General: There is no distension.     Palpations: Abdomen is soft.     Comments: Some tenderness over left ribs. BS slightly hyperactive  Skin:    General: Skin is warm and dry.  Neurological:     Mental Status: He is alert.  Psychiatric:        Mood and Affect: Mood normal.        Behavior: Behavior normal.    Laboratory: Recent Labs  Lab 12/11/20 0340 12/12/20 0044 12/13/20 0629  WBC 9.6 8.7 8.0  HGB 8.1* 8.7* 8.2*  HCT 25.2* 25.5* 26.1*  PLT 281 259 249   Recent Labs  Lab 12/10/20 0334 12/11/20 0340 12/12/20 0044  NA 139 137 136  K 3.8 3.8 3.8  CL 111 107 108  CO2 22 21* 18*  BUN 13 13 12   CREATININE 0.64 0.61 0.62  CALCIUM 8.3* 8.3* 7.9*  PROT 5.0* 5.2* 5.3*   BILITOT 1.2 1.2 1.5*  ALKPHOS 104 125 117  ALT 53* 57* 47*  AST 71* 54* 40  GLUCOSE 151* 150* 109*   Updated HGB 8.1  , MD 12/13/2020, 11:28 AM PGY-1, [facility] Family Medicine FPTS Intern pager: [phone], text pages welcome

## 2020-12-13 NOTE — TOC Initial Note (Signed)
Transition of Care [facility]) - Initial/Assessment Note    Patient Details  Name: Ernest Rodgers MRN: [id number] Date of Birth: December 04, 1970  Transition of Care [name]) CM/SW Contact:    Leone Haven, RN Phone Number: 12/13/2020, 12:42 PM  Clinical Narrative:                 DC to CIR.  Expected Discharge Plan: IP Rehab Facility Barriers to Discharge: No Barriers Identified   Patient Goals and CMS Choice Patient states their goals for this hospitalization and ongoing recovery are:: CIR      Expected Discharge Plan and Services Expected Discharge Plan: IP Rehab Facility   Discharge Planning Services: CM Consult   Living arrangements for the past 2 months: Single Family Home Expected Discharge Date: 12/13/20                         HH Arranged: NA          Prior Living Arrangements/Services Living arrangements for the past 2 months: Single Family Home Lives with:: Spouse Patient language and need for interpreter reviewed:: Yes Do you feel safe going back to the place where you live?: Yes      Need for Family Participation in Patient Care: No (Comment) Care giver support system in place?: Yes (comment)   Criminal Activity/Legal Involvement Pertinent to Current Situation/Hospitalization: No - Comment as needed  Activities of Daily Living Home Assistive Devices/Equipment: Eyeglasses ADL Screening (condition at time of admission) Patient's cognitive ability adequate to safely complete daily activities?: Yes Is the patient deaf or have difficulty hearing?: No Does the patient have difficulty seeing, even when wearing glasses/contacts?: No Does the patient have difficulty concentrating, remembering, or making decisions?: No Patient able to express need for assistance with ADLs?: Yes Does the patient have difficulty dressing or bathing?: Yes Independently performs ADLs?: Yes (appropriate for developmental age) Does the patient have difficulty walking or climbing  stairs?: No Weakness of Legs: None Weakness of Arms/Hands: None  Permission Sought/Granted                  Emotional Assessment       Orientation: : Oriented to Self, Oriented to Place, Oriented to  Time, Oriented to Situation Alcohol / Substance Use: Not Applicable Psych Involvement: No (comment)  Admission diagnosis:  Acute pancreatitis [K85.90] Trauma [T14.90XA] Acute pancreatitis without infection or necrosis, unspecified pancreatitis type [K85.90] Patient Active Problem List   Diagnosis Date Noted   Dyspnea    Physical deconditioning    Abnormal CT of the abdomen    Abdominal pain    Acute respiratory distress    Somnolence    Acute kidney injury (HCC) 11/28/2020   Alcohol withdrawal syndrome (HCC) 11/28/2020   Hyperbilirubinemia 11/28/2020   Hematuria    Multiple closed fractures of ribs of left side    Acute pancreatitis 11/26/2020   PCP:  Oneita Hurt No Pharmacy:   [facility] [address] [place] [address] [place] [address] Phone: [phone] Fax: [phone]     Social Determinants of Health (SDOH) Interventions    Readmission Risk Interventions Readmission Risk Prevention Plan 12/13/2020  Transportation Screening Complete  PCP or Specialist Appt within 3-5 Days Complete  HRI or Home Care Consult Complete  Social Work Consult for Recovery Care Planning/Counseling Complete  Palliative Care Screening Not Applicable  Medication Review Oceanographer) Complete

## 2020-12-13 NOTE — Plan of Care (Signed)

## 2020-12-14 DIAGNOSIS — R5381 Other malaise: Secondary | ICD-10-CM | POA: Diagnosis not present

## 2020-12-14 LAB — CBC WITH DIFFERENTIAL/PLATELET
Abs Immature Granulocytes: 0.08 10*3/uL — ABNORMAL HIGH (ref 0.00–0.07)
Basophils Absolute: 0 10*3/uL (ref 0.0–0.1)
Basophils Relative: 0 %
Eosinophils Absolute: 0 10*3/uL (ref 0.0–0.5)
Eosinophils Relative: 0 %
HCT: 26.7 % — ABNORMAL LOW (ref 39.0–52.0)
Hemoglobin: 8.6 g/dL — ABNORMAL LOW (ref 13.0–17.0)
Immature Granulocytes: 1 %
Lymphocytes Relative: 9 %
Lymphs Abs: 0.8 10*3/uL (ref 0.7–4.0)
MCH: 34.4 pg — ABNORMAL HIGH (ref 26.0–34.0)
MCHC: 32.2 g/dL (ref 30.0–36.0)
MCV: 106.8 fL — ABNORMAL HIGH (ref 80.0–100.0)
Monocytes Absolute: 0.6 10*3/uL (ref 0.1–1.0)
Monocytes Relative: 7 %
Neutro Abs: 6.8 10*3/uL (ref 1.7–7.7)
Neutrophils Relative %: 83 %
Platelets: 282 10*3/uL (ref 150–400)
RBC: 2.5 MIL/uL — ABNORMAL LOW (ref 4.22–5.81)
RDW: 18.8 % — ABNORMAL HIGH (ref 11.5–15.5)
WBC: 8.2 10*3/uL (ref 4.0–10.5)
nRBC: 0.4 % — ABNORMAL HIGH (ref 0.0–0.2)

## 2020-12-14 LAB — COMPREHENSIVE METABOLIC PANEL
ALT: 29 U/L (ref 0–44)
AST: 21 U/L (ref 15–41)
Albumin: 2 g/dL — ABNORMAL LOW (ref 3.5–5.0)
Alkaline Phosphatase: 106 U/L (ref 38–126)
Anion gap: 7 (ref 5–15)
BUN: <5 mg/dL — ABNORMAL LOW (ref 6–20)
CO2: 21 mmol/L — ABNORMAL LOW (ref 22–32)
Calcium: 7.9 mg/dL — ABNORMAL LOW (ref 8.9–10.3)
Chloride: 108 mmol/L (ref 98–111)
Creatinine, Ser: 0.54 mg/dL — ABNORMAL LOW (ref 0.61–1.24)
GFR, Estimated: >60 mL/min (ref >60)
Glucose, Bld: 130 mg/dL — ABNORMAL HIGH (ref 70–99)
Potassium: 3.1 mmol/L — ABNORMAL LOW (ref 3.5–5.1)
Sodium: 136 mmol/L (ref 135–145)
Total Bilirubin: 1 mg/dL (ref 0.3–1.2)
Total Protein: 5.5 g/dL — ABNORMAL LOW (ref 6.5–8.1)

## 2020-12-14 LAB — VITAMIN D 25 HYDROXY (VIT D DEFICIENCY, FRACTURES): Vit D, 25-Hydroxy: 39.91 ng/mL (ref 30–100)

## 2020-12-14 LAB — MAGNESIUM: Magnesium: 1.7 mg/dL (ref 1.7–2.4)

## 2020-12-14 MED ORDER — JUVEN PO PACK
1.0000 | PACK | Freq: Two times a day (BID) | ORAL | Status: DC
  Administered 2020-12-14 – 2020-12-21 (×15): 1 via ORAL
  Filled 2020-12-14 (×14): qty 1

## 2020-12-14 MED ORDER — TRAZODONE HCL 50 MG PO TABS
25.0000 mg | ORAL_TABLET | Freq: Every evening | ORAL | Status: DC | PRN
  Administered 2020-12-14 – 2020-12-18 (×4): 50 mg via ORAL
  Filled 2020-12-14 (×4): qty 1

## 2020-12-14 MED ORDER — LIDOCAINE 5 % EX PTCH
2.0000 | MEDICATED_PATCH | Freq: Every day | CUTANEOUS | Status: AC
  Administered 2020-12-14: 2 via TRANSDERMAL

## 2020-12-14 MED ORDER — POTASSIUM CHLORIDE CRYS ER 20 MEQ PO TBCR
40.0000 meq | EXTENDED_RELEASE_TABLET | Freq: Two times a day (BID) | ORAL | Status: DC
  Administered 2020-12-14 – 2020-12-18 (×9): 40 meq via ORAL
  Filled 2020-12-14 (×9): qty 2

## 2020-12-14 MED ORDER — LIDOCAINE 5 % EX PTCH
2.0000 | MEDICATED_PATCH | Freq: Every day | CUTANEOUS | Status: DC
  Administered 2020-12-15 – 2020-12-21 (×7): 2 via TRANSDERMAL
  Filled 2020-12-14 (×8): qty 2

## 2020-12-14 MED ORDER — OXYCODONE HCL 5 MG PO TABS
5.0000 mg | ORAL_TABLET | ORAL | Status: DC | PRN
Start: 2020-12-14 — End: 2020-12-15
  Administered 2020-12-14 – 2020-12-15 (×6): 5 mg via ORAL
  Filled 2020-12-14 (×6): qty 1

## 2020-12-14 MED ORDER — MAGNESIUM GLUCONATE 500 MG PO TABS
250.0000 mg | ORAL_TABLET | Freq: Every day | ORAL | Status: DC
  Administered 2020-12-14 – 2020-12-16 (×3): 250 mg via ORAL
  Filled 2020-12-14 (×4): qty 1

## 2020-12-14 MED ORDER — LIDOCAINE 5 % EX PTCH: 2.0000 | MEDICATED_PATCH | Freq: Every day | CUTANEOUS | Status: DC

## 2020-12-14 NOTE — Plan of Care (Signed)
  Problem: Consults Goal: RH GENERAL PATIENT EDUCATION Description: See Patient Education module for education specifics. Outcome: Progressing   Problem: RH PAIN MANAGEMENT Goal: RH STG PAIN MANAGED AT OR BELOW PT'S PAIN GOAL Description: < 3 on a 0-10 pain scale. Outcome: Progressing   Problem: RH KNOWLEDGE DEFICIT GENERAL Goal: RH STG INCREASE KNOWLEDGE OF SELF CARE AFTER HOSPITALIZATION Description: Patient will demonstrate knowledge of medication management and pain management with educational materials and handouts provided by staff independently at discharge. Outcome: Progressing

## 2020-12-14 NOTE — Progress Notes (Signed)
Occupational Therapy Session Note  Patient Details  Name: Ernest Rodgers MRN: [id number] Date of Birth: 1970/06/20  Today's Date: 12/14/2020 OT Individual Time: 1340-1434 OT Individual Time Calculation (min): 54 min    Short Term Goals: Week 1:  OT Short Term Goal 1 (Week 1): STG = LTGs due to ELOS  Skilled Therapeutic Interventions/Progress Updates:  Treatment session with focus on self-care retraining and activity tolerance.  Pt received upright in bed with wife present. Pt reports "worn out" from earlier therapy sessions but still desiring shower.  Pt ambulated to toilet without AD with CGA.  Pt completed toileting task and then returned to sitting EOB to rest prior to shower.  Pt ambulated to room shower without AD with CGA and min assist when stepping over shower ledge.  Pt completed bathing mostly seated, standing only to wash buttocks. Pt reports increased fatigue post shower.  Therapist educated on energy conservation strategies and temperature control and shower seat for home shower as methods for increased safety and endurance in shower.  Pt required 4-5 min seated rest break on shower seat post shower prior to exiting shower.  Pt ambulated back to bed with Min assist and use of RW due to fatigue.  Pt required physical assistance to don RLE in to pants and assist to adjust over hips.  Pt reports "worn out" and returned to semi-reclined in bed.  Pt's wife present throughout session, engaging in discussion about energy conservation, recommendation for shower seat at home, and typical timeline for recovery.   Therapy Documentation Precautions:  Precautions Precautions: Fall Precaution Comments: Monitor HR Restrictions Weight Bearing Restrictions: No General:   Vital Signs: Therapy Vitals Temp: 98.8 F (37.1 C) Temp Source: Oral Pulse Rate: 84 Resp: 18 BP: 122/79 Patient Position (if appropriate): Lying Oxygen Therapy SpO2: 93 % O2 Device: Room Air Pain: Pain Assessment Pain  Score: 4     Therapy/Group: Individual Therapy  Rosalio Loud 12/14/2020, 3:17 PM

## 2020-12-14 NOTE — Progress Notes (Signed)
PROGRESS NOTE   Subjective/Complaints: Mr. Michel asks whether his pain medication can be increased to [facility]- he is receiving oxycodone 5mg - discussed this would be fine.  He slept much better last night with Trazodone  ROS: +pain  Objective:   CT Angio Chest Pulmonary Embolism (PE) W or WO Contrast  Result Date: 12/12/2020 CLINICAL DATA:  Shortness of breath EXAM: CT ANGIOGRAPHY CHEST WITH CONTRAST TECHNIQUE: Multidetector CT imaging of the chest was performed using the standard protocol during bolus administration of intravenous contrast. Multiplanar CT image reconstructions and MIPs were obtained to evaluate the vascular anatomy. CONTRAST:  55mL OMNIPAQUE IOHEXOL 350 MG/ML SOLN COMPARISON:  CT from the previous day. FINDINGS: Cardiovascular: Thoracic aorta demonstrates atherosclerotic calcifications. The ascending aorta is mildly prominent at the upper limits of normal in size measuring 3.9 cm. Normal tapering in the aortic arch is noted. No cardiac enlargement is seen. The pulmonary artery shows a normal branching pattern bilaterally. No definitive filling defect to suggest pulmonary embolism is noted. Mediastinum/Nodes: Thoracic inlet is within normal limits. No sizable hilar or mediastinal adenopathy is noted. The esophagus as visualized is within normal limits. Lungs/Pleura: Lungs again demonstrate bilateral lower lobe consolidation with small left pleural effusion. The overall appearance is similar to that seen on the prior exam. No pneumothorax is identified. No sizable parenchymal nodule is seen. Upper Abdomen: Postsurgical changes are noted in the stomach. Prominence of the spleen with subcapsular hematoma is again identified. Musculoskeletal: Degenerative changes of the thoracic spine are noted. Review of the MIP images confirms the above findings. IMPRESSION: No evidence of pulmonary emboli. Bilateral lower lobe consolidation with small  left pleural effusion stable from the prior exam. Changes consistent with the known subcapsular splenic hematoma. Aortic Atherosclerosis (ICD10-I70.0). Electronically Signed   By: 44m M.D.   On: 12/12/2020 11:09   Recent Labs    12/13/20 0629 12/14/20 0543  WBC 8.0 8.2  HGB 8.2* 8.6*  HCT 26.1* 26.7*  PLT 249 282   Recent Labs    12/12/20 0044 12/14/20 0543  NA 136 136  K 3.8 3.1*  CL 108 108  CO2 18* 21*  GLUCOSE 109* 130*  BUN 12 <5*  CREATININE 0.62 0.54*  CALCIUM 7.9* 7.9*    Intake/Output Summary (Last 24 hours) at 12/14/2020 0935 Last data filed at 12/14/2020 0400 Gross per 24 hour  Intake --  Output 875 ml  Net -875 ml        Physical Exam: Vital Signs Blood pressure 121/82, pulse 91, temperature 98.2 F (36.8 C), temperature source Oral, resp. rate 18, height 5\' 10"  (1.778 m), weight 113.3 kg, SpO2 92 %. Gen: no distress, normal appearing HEENT: oral mucosa pink and moist, NCAT Cardio: Reg rate Chest: normal effort, normal rate of breathing Abd: soft, non-distended Musculoskeletal:     Cervical back: Normal range of motion. No rigidity.     Comments: UE- biceps 4+/5, triceps 4+/5, WE 5-/5, grip 5-/5, and FA 5-/5 B/L LE- HF 4/5, KE 4/5, KF 4/5, DF and PF 4+/5 B/L   Skin:    Comments: B/L forearm IV's- L doesn't work per pt- R OK- both look well Head shaved No skin  breakdown on backside or feet  Neurological:     Mental Status: He is alert and oriented to person, place, and time.     Comments: Verbose with rapid/slurred speech at times. Needs occasional redirection but able to follow commands without difficulty.  Intact to light touch in all 4 extremities   Psychiatric:        Mood and Affect: Mood normal.        Behavior: Behavior normal.     Comments: tangential    Assessment/Plan: 1. Functional deficits which require 3+ hours per day of interdisciplinary therapy in a comprehensive inpatient rehab setting. Physiatrist is providing close  team supervision and 24 hour management of active medical problems listed below. Physiatrist and rehab team continue to assess barriers to discharge/monitor patient progress toward functional and medical goals  Care Tool:  Bathing              Bathing assist       Upper Body Dressing/Undressing Upper body dressing   What is the patient wearing?: Hospital gown only    Upper body assist Assist Level: Set up assist    Lower Body Dressing/Undressing Lower body dressing            Lower body assist       Toileting Toileting    Toileting assist Assist for toileting: Supervision/Verbal cueing     Transfers Chair/bed transfer  Transfers assist     Chair/bed transfer assist level: Minimal Assistance - Patient > 75%     Locomotion Ambulation   Ambulation assist              Walk 10 feet activity   Assist           Walk 50 feet activity   Assist           Walk 150 feet activity   Assist           Walk 10 feet on uneven surface  activity   Assist           Wheelchair     Assist               Wheelchair 50 feet with 2 turns activity    Assist            Wheelchair 150 feet activity     Assist          Blood pressure 121/82, pulse 91, temperature 98.2 F (36.8 C), temperature source Oral, resp. rate 18, height 5\' 10"  (1.778 m), weight 113.3 kg, SpO2 92 %.  Medical Problem List and Plan: 1.  Debility secondary to [facility] hospital stay from pancreatitis/intubation, splenic hematoma s/p embolization; and flash pulm edema             -patient may  shower             -ELOS/Goals: 10-14 days- supervision  Initial CIR evaluations today 2.  Antithrombotics: -DVT/anticoagulation:  Pharmaceutical: Lovenox             -antiplatelet therapy: N/A 3. Pain: Tylenol 650 mg qid --Increase oxycodone to 5mg  q4H prn 4. Mood: LCSW to follow for evalaution and support.              -antipsychotic agents:  N/A 5. Neuropsych: This patient may be intermittently capable of making decisions on his own behalf. 6. Skin/Wound Care: Routine pressure relief measures.  7. Fluids/Electrolytes/Nutrition: Monitor I/O. Check CMET in am.  8. DOE: Encourage pulmonary toilet.  Question  fluid overload--wt up by average of 15 lbs?              --Monitor for signs of overload and check daily standing weights   9. SBO/Ileus: Has resolved. Augment bowel program as indicated             --Keep K>4.0 and Mg>2.0 to prevent recurrence             --add Kdur for supplement as on lasix and K<4.0 10. HTN/Tachycardia: Monitor HR TID--on Toprol XL, Lisinopril and Furosemide.  11. Abnormal LFTs:Resolving--recheck in am 12. Acute blood loss anemia: Continue to monitor--especially with lovenox on board             --stable in 7-8 range.  13. ETOH abuse/Hepatic steatosis: On Thiamine and Folic acid.  14. Nose bleed: Monitor for recurrence.  15. Low protein: start Juven 16. Hyperglycemia: d/c Ensure    LOS: 1 days A FACE TO FACE EVALUATION WAS PERFORMED  Clint Bolder P Makhi Muzquiz 12/14/2020, 9:35 AM

## 2020-12-14 NOTE — Progress Notes (Signed)
Inpatient Rehabilitation  Patient information reviewed and entered into eRehab system by Karron Alvizo Kalei Mckillop, OTR/L.   Information including medical coding, functional ability and quality indicators will be reviewed and updated through discharge.    

## 2020-12-14 NOTE — Evaluation (Signed)
Physical Therapy Assessment and Plan  Patient Details  Name: Ernest Rodgers MRN: [id number] Date of Birth: 08/12/1970  PT Diagnosis: Abnormality of gait, Difficulty walking, Impaired sensation, Muscle weakness, and Pain in bilateral ribs Rehab Potential: Good ELOS: 10 days   Today's Date: 12/14/2020 PT Individual Time: 0867-6195 PT Individual Time Calculation (min): 72 min    Hospital Problem: Principal Problem:   Debility   Past Medical History:  Past Medical History:  Diagnosis Date   Hypertension    Kidney stone    Past Surgical History:  Past Surgical History:  Procedure Laterality Date   GASTRECTOMY     IR ANGIOGRAM VISCERAL SELECTIVE  12/06/2020   IR EMBO ART  VEN HEMORR LYMPH EXTRAV  INC GUIDE ROADMAPPING  12/06/2020   IR US GUIDE VASC ACCESS RIGHT  12/06/2020    Assessment & Plan Clinical Impression: Patient is a 50 y.o. year old male with history of HTN, renal calculi, gastric bypass, MDD, , ETOH abuse who was admitted on 11/26/20 wit abdominal pain and reports of fall 2 days PTA with rib pain. Work up revealed acute pancreatitis with left 6-8th rib fracture as well as hepatic steatosis. He was made NPO and treated with fluids and started on CIWA protocol. He was found unresponsive and hypoxic due to aspiration pneumonitis requiring intubation on 08/18. He was started on IV anbitiocs for staph aureus in sputum and had issues with agitation requiring precedex.   He continued to have fevers and antibiotics changed to Linezolid on 08/21.    He was started on tube feeds for nutritional support but developed abdominal distension due to distal SBO and Dr. Rosendo Gros recommended NGT to suction with serial X rays.   He was extubated on 08/23 but noted to have severe hallucination with agitation, fever as well as tachycardia overnight as well as drop in Hgb from 11-->9.2.  CTA chest was  negative for PE but he was found to have new large subcapsular splenic hematoma with hemoperitoneum and  concerns of active bleeding, progression of pancreatitis with small peripancreatic pseudocyst and question of pancreatic necrosis and resolving BLL PNA.  IR consulted for input and recommended monitoring for 24 hours with serial CBC but patient continued to have issues with hypotension and progressive drop in H/H despite 1 unit PRBC. He underwent splenic artery embolization by Dr. Anselm Pancoast on 08/25 and required 3 total units of PRBC.    He was started on TPN for nutritional support and advanced to clears on 08/26. He continued t Dr. Tarri Glenn consulted for input on pancreatitis and recommended supportive care with fluids, antibiotics and die advancement as tolerated.  Mentation has improved and he has been advanced to regular diet. He developed severe hypoxia on 08/31 and repeat CTA chest was negative for PE and should stable BLL consolidation. He was started on low dose lasix but continues to have significant DOE with tachypnea with activity. Therapy ongoing and patient limited by weakness, DOE requiring multiple rest breaks with minimal activity.CIR recommended due to functional decline.   Patient currently requires min with mobility secondary to muscle weakness, decreased cardiorespiratoy endurance, and decreased standing balance, decreased postural control, and decreased balance strategies.  Prior to hospitalization, patient was independent  with mobility and lived with Spouse in a Other(Comment) (9 foot RV) home.  Home access is 6Stairs to enter.  Patient will benefit from skilled PT intervention to maximize safe functional mobility, minimize fall risk, and decrease caregiver burden for planned discharge home with 24 hour supervision.  Anticipate patient will benefit from follow up OP at discharge.  PT - End of Session Activity Tolerance: Tolerates 30+ min activity with multiple rests Endurance Deficit: Yes Endurance Deficit Description: required frequent rest breaks PT Assessment Rehab Potential  (ACUTE/IP ONLY): Good PT Barriers to Discharge: Inaccessible home environment;Home environment access/layout PT Barriers to Discharge Comments: 6 STE with 1 rail PT Patient demonstrates impairments in the following area(s): Balance;Endurance;Motor;Pain;Sensory;Skin Integrity PT Transfers Functional Problem(s): Bed Mobility;Bed to Chair;Car;Furniture PT Locomotion Functional Problem(s): Ambulation;Wheelchair Mobility;Stairs PT Plan PT Intensity: Minimum of 1-2 x/day ,45 to 90 minutes PT Frequency: 5 out of 7 days PT Duration Estimated Length of Stay: 10 days PT Treatment/Interventions: Ambulation/gait training;Discharge planning;Functional mobility training;Psychosocial support;Therapeutic Activities;Balance/vestibular training;Disease management/prevention;Neuromuscular re-education;Skin care/wound management;Therapeutic Exercise;Wheelchair propulsion/positioning;DME/adaptive equipment instruction;Pain management;Splinting/orthotics;UE/LE Strength taining/ROM;Community reintegration;Patient/family education;Stair training;UE/LE Coordination activities PT Transfers Anticipated Outcome(s): mod I PT Locomotion Anticipated Outcome(s): mod I PT Recommendation Follow Up Recommendations: Outpatient PT Patient destination: Home Equipment Recommended: To be determined Equipment Details: has none   PT Evaluation Precautions/Restrictions Fall (monitor HR) Weight Bearing Restrictions: No Pain Interference Pain Interference Pain Effect on Sleep: 3. Frequently Pain Interference with Therapy Activities: 1. Rarely or not at all Pain Interference with Day-to-Day Activities: 1. Rarely or not at all Home Living/Prior [place] Available Help at Discharge: Family;Available 24 hours/day (daughter can come stay with them, admission coordinator reports wife arranging 24/7) Type of Home: Other(Comment) (18 foot RV) Home Access: Stairs to enter Entrance Stairs-Number of Steps: 6 Entrance  Stairs-Rails: Left Home Layout: One level Bathroom Shower/Tub: Walk-in shower (2-3 inch ledge) Bathroom Toilet: Standard Bathroom Accessibility: No  Lives With: Spouse Prior Function Level of Independence: Independent with basic ADLs;Independent with transfers;Independent with homemaking with ambulation;Independent with gait  Able to Take Stairs?: Yes Driving: Yes Vocation: Full time employment Comments: pt works as a travel Therapist, sports at [employer]; wife works on [employer] Vision/Perception  Vision - History Ability to See in Adequate Light: 0 Adequate Perception Perception: Within Functional Limits Praxis Praxis: Intact  Cognition Overall Cognitive Status: Within Functional Limits for tasks assessed Arousal/Alertness: Awake/alert Orientation Level: Oriented X4 Memory: Appears intact Awareness: Appears intact Problem Solving: Appears intact Safety/Judgment: Appears intact Sensation Sensation Light Touch: Impaired by gross assessment Proprioception: Impaired by gross assessment Additional Comments: pt reports hypersensitivity above bilateral thighs and along abdomen Coordination Gross Motor Movements are Fluid and Coordinated: No Fine Motor Movements are Fluid and Coordinated: No Coordination and Movement Description: grossly uncoordinated due to generalized weakness/fatigue, pain, and decreased balance/postural control Finger Nose Finger Test: slower on LUE due to rib pain Heel Shin Test: [facility] bilaterally Motor  Motor Motor: Abnormal postural alignment and control Motor - Skilled Clinical Observations: grossly uncoordinated due to generalized weakness/fatigue, pain, and decreased balance/postural control  Trunk/Postural Assessment  Cervical Assessment Cervical Assessment: Within Functional Limits Thoracic Assessment Thoracic Assessment: Within Functional Limits Lumbar Assessment Lumbar Assessment: Within Functional Limits Postural Control Postural Control: Deficits on evaluation   Balance Balance Balance Assessed: Yes Static Sitting Balance Static Sitting - Balance Support: Feet supported;Bilateral upper extremity supported Static Sitting - Level of Assistance: 6: Modified independent (Device/Increase time) Dynamic Sitting Balance Dynamic Sitting - Balance Support: Feet supported;No upper extremity supported Dynamic Sitting - Level of Assistance: 5: Stand by assistance (supervision) Dynamic Sitting - Balance Activities: Lateral lean/weight shifting;Forward lean/weight shifting Sitting balance - Comments: Pt donning shorts at EOB Static Standing Balance Static Standing - Balance Support: No upper extremity supported Static Standing - Level of Assistance: 4: Min assist Dynamic Standing Balance  Dynamic Standing - Balance Support: No upper extremity supported Dynamic Standing - Level of Assistance: 4: Min assist Dynamic Standing - Balance Activities: Lateral lean/weight shifting;Forward lean/weight shifting Extremity Assessment  RLE Assessment RLE Assessment: Exceptions to [facility] General Strength Comments: grossly generalized to 4/5 LLE Assessment LLE Assessment: Exceptions to [facility] General Strength Comments: grossly generalized to 4/5  Care Tool Care Tool Bed Mobility Roll left and right activity   Roll left and right assist level: Supervision/Verbal cueing    Sit to lying activity   Sit to lying assist level: Supervision/Verbal cueing    Lying to sitting on side of bed activity   Lying to sitting on side of bed assist level: the ability to move from lying on the back to sitting on the side of the bed with no back support.: Supervision/Verbal cueing     Care Tool Transfers Sit to stand transfer   Sit to stand assist level: Minimal Assistance - Patient > 75%    Chair/bed transfer   Chair/bed transfer assist level: Minimal Assistance - Patient > 75%     Physiological scientist transfer assist level: Minimal Assistance - Patient >  75%      Care Tool Locomotion Ambulation   Assist level: Minimal Assistance - Patient > 75% Assistive device: No Device Max distance: 2f  Walk 10 feet activity   Assist level: Minimal Assistance - Patient > 75% Assistive device: No Device   Walk 50 feet with 2 turns activity   Assist level: Minimal Assistance - Patient > 75% Assistive device: No Device  Walk 150 feet activity Walk 150 feet activity did not occur: Safety/medical concerns (fatigue, generalized weakness/deconditioning, decreased balance/postural control)      Walk 10 feet on uneven surfaces activity Walk 10 feet on uneven surfaces activity did not occur: Safety/medical concerns (fatigue, generalized weakness/deconditioning, decreased balance/postural control)      Stairs Stair activity did not occur: Safety/medical concerns (fatigue, generalized weakness/deconditioning, decreased balance/postural control)        Walk up/down 1 step activity Walk up/down 1 step or curb (drop down) activity did not occur: Safety/medical concerns (fatigue, generalized weakness/deconditioning, decreased balance/postural control)     Walk up/down 4 steps activity did not occuR: Safety/medical concerns (fatigue, generalized weakness/deconditioning, decreased balance/postural control)  Walk up/down 4 steps activity      Walk up/down 12 steps activity Walk up/down 12 steps activity did not occur: Safety/medical concerns (fatigue, generalized weakness/deconditioning, decreased balance/postural control)      Pick up small objects from floor Pick up small object from the floor (from standing position) activity did not occur: Safety/medical concerns (fatigue, generalized weakness/deconditioning, decreased balance/postural control)      Wheelchair Is the patient using a wheelchair?: Yes Type of Wheelchair: Manual Wheelchair activity did not occur: Safety/medical concerns Wheelchair assist level: Dependent - Patient 0% Max wheelchair  distance: 1583f Wheel 50 feet with 2 turns activity   Assist Level: Dependent - Patient 0%  Wheel 150 feet activity   Assist Level: Dependent - Patient 0%    Refer to Care Plan for Long Term Goals  SHORT TERM GOAL WEEK 1 PT Short Term Goal 1 (Week 1): STG=LTG due to LOS  Recommendations for other services: None   Skilled Therapeutic Intervention Evaluation completed (see details above and below) with education on PT POC and goals and individual treatment initiated with focus on functional mobility/transfers, generalized strengthening, dynamic standing balance/coordination, ambulation, simulated car  transfers, and improved activity tolerance. Received pt semi-reclined in bed, pt educated on PT evaluation, CIR policies, therapy schedule and agreeable. Pt reported pain 5/10 in bilateral ribs but not due for pain medication yet. Repositioning, rest breaks, and distraction done to reduce pain levels. Provided pt with 20x16 manual WC and cushion. Pt transferred supine<>sitting EOB from flat bed with supervision and use of bedrails. Sit<>stand without AD and min A and ambulated 74f without AD and min A with 1 standing rest break. Pt limited by decreased endurance with mild SOB when ambulating. Pt demonstrates narrow BOS, decreased bilateral foot clearance, and decreased cadence. Pt very excited about walking without a RW for the first time and requested to call his wife. Pt then requested to go to [employer] to see coworkers; [facility] purposes to uplift mood/spirits; pt very appreciative. Pt then transported to [employer] ortho gym and performed ambulatory simulated car transfer into jeep with 6in running board with min A overall -pt required cues for safe entry and to be extremely cautious when holding onto mobile doorframe (pt requesting to do it the way he does at home). [facility] and ambulated 541fwithout AD and min A to bed and  transferred sit<>supine with supervision. Concluded session with pt supine in bed, needs within reach, and bed alarm on. RN updated on pt's current status.   Mobility Bed Mobility Bed Mobility: Rolling Right;Rolling Left;Sit to Supine;Supine to Sit Rolling Right: Supervision/verbal cueing Rolling Left: Supervision/Verbal cueing Right Sidelying to Sit: Minimal Assistance - Patient > 75% Supine to Sit: Supervision/Verbal cueing Sit to Supine: Supervision/Verbal cueing Sit to Sidelying Right: Contact Guard/Touching assist (bed rails) Transfers Transfers: Sit to Stand;Stand to Sit;Stand Pivot Transfers Sit to Stand: Minimal Assistance - Patient > 75% Stand to Sit: Contact Guard/Touching assist Stand Pivot Transfers: Minimal Assistance - Patient > 75% Stand Pivot Transfer Details: Verbal cues for precautions/safety;Verbal cues for technique Stand Pivot Transfer Details (indicate cue type and reason): verbal cues for safety when turning Transfer (Assistive device): None Locomotion  Gait Ambulation: Yes Gait Assistance: Minimal Assistance - Patient > 75% Gait Distance (Feet): 57 Feet Assistive device: None Gait Assistance Details: Verbal cues for precautions/safety Gait Assistance Details: verbal cues for energy conservation and to take standing rest breaks as needed Gait Gait: Yes Gait Pattern: Impaired Gait Pattern: Step-to pattern;Decreased trunk rotation;Decreased stride length;Decreased step length - right;Decreased step length - left;Trunk flexed;Poor foot clearance - left;Poor foot clearance - right;Narrow base of support Gait velocity: decreased   Discharge Criteria: Patient will be discharged from PT if patient refuses treatment 3 consecutive times without medical reason, if treatment goals not met, if there is a change in medical status, if patient makes no progress towards goals or if patient is discharged from hospital.  The above assessment, treatment plan, treatment  alternatives and goals were discussed and mutually agreed upon: by patient  AnAlfonse AlpersT, DPT  12/14/2020, 12:20 PM

## 2020-12-14 NOTE — Progress Notes (Signed)
Patient ID: Ernest Rodgers, male   DOB: 1971-03-12, 50 y.o.   MRN: [id number]  **Late Entry**  Met with patient on arrival to 5C02 and explained my role in his care while on rehab. I explained the rehab process, rehab schedule, and what he could expect while on rehab. 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 Patient is very motivated for rehab. I will continue to follow this patient's progress.  Dorthula Nettles, RN3, BSN, CBIS, [place], [facility], Inpatient Rehabilitation Office [phone] Cell [phone]

## 2020-12-14 NOTE — Evaluation (Signed)
Occupational Therapy Assessment and Plan  Patient Details  Name: Ernest Rodgers MRN: [id number] Date of Birth: 02-09-1971  OT Diagnosis: abnormal posture, acute pain, and muscle weakness (generalized) Rehab Potential: Rehab Potential (ACUTE ONLY): Good ELOS: 10-12 days   Today's Date: 12/14/2020 OT Individual Time: 9622-2979 OT Individual Time Calculation (min): 60 min     Hospital Problem: Principal Problem:   Debility   Past Medical History:  Past Medical History:  Diagnosis Date   Hypertension    Kidney stone    Past Surgical History:  Past Surgical History:  Procedure Laterality Date   GASTRECTOMY     IR ANGIOGRAM VISCERAL SELECTIVE  12/06/2020   IR EMBO ART  VEN HEMORR LYMPH EXTRAV  INC GUIDE ROADMAPPING  12/06/2020   IR US GUIDE VASC ACCESS RIGHT  12/06/2020    Assessment & Plan Clinical Impression: Patient is a 50 y.o. year old male with history of HTN, renal calculi, gastric bypass, MDD, , ETOH abuse who was admitted on 11/26/20 wit abdominal pain and reports of fall 2 days PTA with rib pain. Work up revealed acute pancreatitis with left 6-8th rib fracture as well as hepatic steatosis. He was made NPO and treated with fluids and started on CIWA protocol. He was found unresponsive and hypoxic due to aspiration pneumonitis requiring intubation on 08/18. He was started on IV anbitiocs for staph aureus in sputum and had issues with agitation requiring precedex.   He continued to have fevers and antibiotics changed to Linezolid on 08/21.    He was started on tube feeds for nutritional support but developed abdominal distension due to distal SBO and Dr. Rosendo Gros recommended NGT to suction with serial X rays.   He was extubated on 08/23 but noted to have severe hallucination with agitation, fever as well as tachycardia overnight as well as drop in Hgb from 11-->9.2.  CTA chest was  negative for PE but he was found to have new large subcapsular splenic hematoma with hemoperitoneum and  concerns of active bleeding, progression of pancreatitis with small peripancreatic pseudocyst and question of pancreatic necrosis and resolving BLL PNA.  IR consulted for input and recommended monitoring for 24 hours with serial CBC but patient continued to have issues with hypotension and progressive drop in H/H despite 1 unit PRBC. He underwent splenic artery embolization by Dr. Anselm Pancoast on 08/25 and required 3 total units of PRBC.    He was started on TPN for nutritional support and advanced to clears on 08/26. He continued t Dr. Tarri Glenn consulted for input on pancreatitis and recommended supportive care with fluids, antibiotics and die advancement as tolerated.  Mentation has improved and he has been advanced to regular diet. He developed severe hypoxia on 08/31 and repeat CTA chest was negative for PE and should stable BLL consolidation. He was started on low dose lasix but continues to have significant DOE with tachypnea with activity. Therapy ongoing and patient limited by weakness, DOE requiring multiple rest breaks with minimal activity.CIR recommended due to functional decline.      Patient transferred to CIR on 12/13/2020 .    Patient currently requires min with basic self-care skills secondary to muscle weakness and rib pain, decreased cardiorespiratoy endurance, and decreased standing balance, decreased postural control, and decreased balance strategies.  Prior to hospitalization, patient could complete ADLs and IADLs with independent .  Patient will benefit from skilled intervention to increase independence with basic self-care skills prior to discharge home with care partner.  Anticipate patient will require  intermittent supervision and follow up outpatient.  OT - End of Session Activity Tolerance: Tolerates 30+ min activity with multiple rests Endurance Deficit: Yes Endurance Deficit Description: required frequent rest breaks with DOE OT Assessment Rehab Potential (ACUTE ONLY): Good OT  Barriers to Discharge: Home environment access/layout OT Barriers to Discharge Comments: currently staying in RV due to traveling RN OT Patient demonstrates impairments in the following area(s): Balance;Endurance;Motor;Pain;Safety OT Basic ADL's Functional Problem(s): Grooming;Bathing;Dressing;Toileting OT Advanced ADL's Functional Problem(s): Laundry OT Transfers Functional Problem(s): Toilet;Tub/Shower OT Additional Impairment(s): None OT Plan OT Intensity: Minimum of 1-2 x/day, 45 to 90 minutes OT Frequency: 5 out of 7 days OT Duration/Estimated Length of Stay: 10-12 days OT Treatment/Interventions: Medical illustrator training;Community reintegration;Discharge planning;Disease mangement/prevention;DME/adaptive equipment instruction;Functional mobility training;Pain management;Patient/family education;Psychosocial support;Self Care/advanced ADL retraining;Skin care/wound managment;Therapeutic Activities;Therapeutic Exercise;UE/LE Strength taining/ROM OT Basic Self-Care Anticipated Outcome(s): Mod I OT Toileting Anticipated Outcome(s): Mod I OT Bathroom Transfers Anticipated Outcome(s): Mod I OT Recommendation Recommendations for Other Services: Therapeutic Recreation consult Therapeutic Recreation Interventions: Clinical cytogeneticist;Outing/community reintergration Patient destination: Home Follow Up Recommendations: Outpatient OT Equipment Recommended: None recommended by OT   OT Evaluation Precautions/Restrictions  Precautions Precautions: Fall Precaution Comments: Monitor HR Restrictions Weight Bearing Restrictions: No  Pain Pain Assessment Pain Scale: 0-10 Pain Score: 8  Pain Type: Acute pain Pain Location: Rib cage Pain Orientation: Left Pain Descriptors / Indicators: Aching Pain Onset: On-going Pain Intervention(s):  (premedicated) Home Living/Prior Functioning Home Living Family/patient expects to be discharged to:: Private residence Living Arrangements:  Spouse/significant other Available Help at Discharge: Family, Available 24 hours/day (daughter can come stay with them, admission coordinator reports wife arranging 24/7) Type of Home: Other(Comment) (3 foot RV) Home Access: Stairs to enter Entrance Stairs-Number of Steps: 5-6 Entrance Stairs-Rails: Left Home Layout: One level Bathroom Shower/Tub: Walk-in shower (2-3 inch ledge) Bathroom Toilet: Standard Bathroom Accessibility: Yes  Lives With: Spouse IADL History Homemaking Responsibilities: Yes Meal Prep Responsibility: No Laundry Responsibility: Primary Cleaning Responsibility: Primary Bill Paying/Finance Responsibility: Secondary Shopping Responsibility: Secondary Current License: Yes Prior Function Level of Independence: Independent with basic ADLs, Independent with homemaking with ambulation, Independent with gait  Able to Take Stairs?: Yes Driving: Yes Vocation: Full time employment Comments: pt works as a travel Therapist, sports at [employer]; wife works on [place] Baseline Vision/History: 1 Wears glasses Ability to See in Adequate Light: 0 Adequate Patient Visual Report: No change from baseline Vision Assessment?: No apparent visual deficits Perception  Perception: Within Functional Limits Praxis Praxis: Intact Cognition Overall Cognitive Status: Within Functional Limits for tasks assessed Arousal/Alertness: Awake/alert Orientation Level: Person;Place;Situation Person: Oriented Place: Oriented Situation: Oriented Year: 2022 Month: September Day of Week: Correct Memory: Appears intact Immediate Memory Recall: Sock;Blue;Bed Memory Recall Sock: Without Cue Memory Recall Blue: Without Cue Memory Recall Bed: Without Cue Attention: Selective Selective Attention: Appears intact Awareness: Appears intact Problem Solving: Appears intact Safety/Judgment: Appears intact Sensation Sensation Light Touch: Impaired by gross assessment Proprioception: Impaired by gross  assessment Additional Comments: pt reports hypersensitivity above bilateral thighs and along abdomen Coordination Gross Motor Movements are Fluid and Coordinated: No Fine Motor Movements are Fluid and Coordinated: No Coordination and Movement Description: grossly uncoordinated due to generalized weakness/fatigue, pain, and decreased balance/postural control Finger Nose Finger Test: slower on LUE due to rib pain Heel Shin Test: [facility] bilaterally Motor  Motor Motor: Abnormal postural alignment and control Motor - Skilled Clinical Observations: grossly uncoordinated due to generalized weakness/fatigue, pain, and decreased balance/postural control  Trunk/Postural Assessment  Cervical Assessment Cervical Assessment: Within Functional Limits  Thoracic Assessment Thoracic Assessment: Within Functional Limits Lumbar Assessment Lumbar Assessment: Within Functional Limits Postural Control Postural Control: Deficits on evaluation  Balance Balance Balance Assessed: Yes Dynamic Sitting Balance Dynamic Sitting - Balance Support: Left upper extremity supported;During functional activity Dynamic Sitting - Level of Assistance: 5: Stand by assistance Dynamic Sitting - Balance Activities: Lateral lean/weight shifting;Forward lean/weight shifting Sitting balance - Comments: Pt donning shorts at EOB Static Standing Balance Static Standing - Level of Assistance: 4: Min assist Dynamic Standing Balance Dynamic Standing - Balance Support: No upper extremity supported;During functional activity Dynamic Standing - Level of Assistance: 4: Min assist Dynamic Standing - Balance Activities: Lateral lean/weight shifting;Forward lean/weight shifting Extremity/Trunk Assessment RUE Assessment RUE Assessment: Within Functional Limits General Strength Comments: grossly 4+/5 LUE Assessment LUE Assessment: Exceptions to [facility] Active Range of Motion (AROM) Comments: limited due to rib pain but able to achieve shoulder  flexion ~140* General Strength Comments: grossly 4/5  Care Tool Care Tool Self Care Eating        Oral Care    Oral Care Assist Level: Contact Guard/Toucning assist (standing)    Bathing   Body parts bathed by patient: Right arm;Left arm;Chest;Abdomen;Front perineal area;Buttocks;Right upper leg;Left upper leg;Face     Assist Level: Contact Guard/Touching assist    Upper Body Dressing(including orthotics)   What is the patient wearing?: Pull over shirt   Assist Level: Minimal Assistance - Patient > 75%    Lower Body Dressing (excluding footwear)   What is the patient wearing?: Pants Assist for lower body dressing: Minimal Assistance - Patient > 75%    Putting on/Taking off footwear             Care Tool Toileting Toileting activity   Assist for toileting: Supervision/Verbal cueing     Care Tool Bed Mobility Roll left and right activity   Roll left and right assist level: Supervision/Verbal cueing    Sit to lying activity   Sit to lying assist level: Supervision/Verbal cueing    Lying to sitting on side of bed activity   Lying to sitting on side of bed assist level: the ability to move from lying on the back to sitting on the side of the bed with no back support.: Supervision/Verbal cueing     Care Tool Transfers Sit to stand transfer   Sit to stand assist level: Minimal Assistance - Patient > 75%    Chair/bed transfer   Chair/bed transfer assist level: Minimal Assistance - Patient > 75%     Toilet transfer   Assist Level: Minimal Assistance - Patient > 75%     Care Tool Cognition  Expression of Ideas and Wants Expression of Ideas and Wants: 4. Without difficulty (complex and basic) - expresses complex messages without difficulty and with speech that is clear and easy to understand  Understanding Verbal and Non-Verbal Content Understanding Verbal and Non-Verbal Content: 4. Understands (complex and basic) - clear comprehension without cues or repetitions    Memory/Recall Ability Memory/Recall Ability : Current season;That he or she is in a hospital/hospital unit;Staff names and faces   Refer to Care Plan for [place] 1 OT Short Term Goal 1 (Week 1): STG = LTGs due to ELOS  Recommendations for other services: None  and Therapeutic Recreation  Kitchen group, Stress management, and Outing/community reintegration   Skilled Therapeutic Intervention OT eval completed with discussion of rehab process, OT purpose, POC, ELOS, and goals.  ADL assessment completed at EOB with  focus on activity tolerance and endurance.  Pt received semi-reclined in bed reporting mostly dressed already but needing to don shorts.  Pt completed bed mobility with use of bed rails and supervision.  Pt donned shorts with assistance to thread LLE.  Pt reports not feeling up to bathing at this time but wanting to shower during PM therapy session.  Pt ambulated to sink ~10' with RW with CGA and completed oral care in standing with CGA.  Pt reports fatigue and returned to EOB and required 3-4 min rest break before able to complete more mobility.  Pt ambulated to room toilet with RW and completed toilet transfer to [facility] over toilet for elevated toilet seat and UE support for sit > stand.  Pt requires prolonged rest breaks with all activity. Pt returned to supine and left with all needs in reach.  ADL ADL Grooming: Contact guard Where Assessed-Grooming: Standing at sink Upper Body Bathing: Setup;Supervision/safety Where Assessed-Upper Body Bathing: Shower Lower Body Bathing: Contact guard Where Assessed-Lower Body Bathing: Shower Upper Body Dressing: Minimal cueing Where Assessed-Upper Body Dressing: Edge of bed Lower Body Dressing: Minimal assistance Where Assessed-Lower Body Dressing: Edge of bed Toileting: Supervision/safety Where Assessed-Toileting: Bedside Commode Toilet Transfer: Minimal assistance Toilet Transfer Method: Air traffic controller: Geophysical data processor: Minimal assistance Social research officer, government Method: Heritage manager: Grab bars;Shower seat with back Mobility  Bed Mobility Bed Mobility: Rolling Right;Rolling Left;Sit to Supine;Supine to Sit Rolling Right: Supervision/verbal cueing Rolling Left: Supervision/Verbal cueing Right Sidelying to Sit: Minimal Assistance - Patient > 75% Supine to Sit: Supervision/Verbal cueing Sit to Supine: Supervision/Verbal cueing Sit to Sidelying Right: Contact Guard/Touching assist (bed rails) Transfers Sit to Stand: Minimal Assistance - Patient > 75% Stand to Sit: Contact Guard/Touching assist   Discharge Criteria: Patient will be discharged from OT if patient refuses treatment 3 consecutive times without medical reason, if treatment goals not met, if there is a change in medical status, if patient makes no progress towards goals or if patient is discharged from hospital.  The above assessment, treatment plan, treatment alternatives and goals were discussed and mutually agreed upon: by patient  Simonne Come 12/14/2020, 2:38 PM

## 2020-12-14 NOTE — Progress Notes (Signed)
Inpatient Rehabilitation Care Coordinator Assessment and Plan Patient Details  Name: Ernest Rodgers MRN: [id number] Date of Birth: Sep 08, 1970  Today's Date: 12/14/2020  Hospital Problems: Principal Problem:   Debility  Past Medical History:  Past Medical History:  Diagnosis Date   Hypertension    Kidney stone    Past Surgical History:  Past Surgical History:  Procedure Laterality Date   GASTRECTOMY     IR ANGIOGRAM VISCERAL SELECTIVE  12/06/2020   IR EMBO ART  VEN HEMORR LYMPH EXTRAV  INC GUIDE ROADMAPPING  12/06/2020   IR US GUIDE VASC ACCESS RIGHT  12/06/2020   Social History:  reports that he has never smoked. He has never used smokeless tobacco. No history on file for alcohol use and drug use.  Family / Support Systems Marital Status: Married How Long?: 12 years Patient Roles: Spouse, Parent Spouse/Significant Other: AJ (wife) Children: 6 adult daughters; 5 live in [place], and 1 dtr lives in [place]. 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 Pt reports he has applied for short term disability and it has already started. Legal History/Current Legal Issues: Denies Guardian/Conservator: N/A   Abuse/Neglect Abuse/Neglect Assessment Can Be  Completed: Yes Physical Abuse: Denies Verbal Abuse: Denies Sexual Abuse: Denies Exploitation of patient/patient's resources: Denies Self-Neglect: Denies  Patient response to: Social Isolation - How often do you feel lonely or isolated from those around you?: Never  Emotional Status Pt's affect, behavior and adjustment status: Pt in good spirits at time of visit Recent Psychosocial Issues: Denies Psychiatric History: Denies Substance Abuse History: Denies . Per EMR, pt has anxiety, depression, and has gone through alcohol withdrawal upon admission. Pt drinks daily.  Patient / Family Perceptions, Expectations & Goals Pt/Family understanding of illness & functional limitations: Pt appears to have a general understanding of his care needs Premorbid pt/family roles/activities: Independent Anticipated changes in roles/activities/participation: some assistance with ADLs/IADLs Pt/family expectations/goals: Pt goal is to "go home."  [employer]: None Premorbid Home Care/DME Agencies: None Transportation available at discharge: wife Is the patient able to respond to transportation needs?: Yes In the past 12 months, has lack of transportation kept you from medical appointments or from getting medications?: No In the past 12 months, has lack of transportation kept you from meetings, work, or from getting things needed for daily living?: No Resource referrals recommended: Neuropsychology  Discharge Planning Living Arrangements: Spouse/significant other Support Systems: Spouse/significant other Type of Residence: Private residence, Other (Comment) (Lives on [address] in [place].) Insurance Resources: Multimedia programmer (specify) Psychologist, counselling) Financial Resources: Employment, Other (Comment) (short term disability) Financial Screen Referred: No Living Expenses: Own Money Management: Patient Does the patient have any problems obtaining your medications?:  No Home Management: Pt wife manages meal prep. Both address home care needs, and pt repairs needs in the home. Patient/Family Preliminary Plans:  TBD Care Coordinator Barriers to Discharge: Decreased caregiver support, Lack of/limited family support Care Coordinator Anticipated Follow Up Needs: HH/OP  Clinical Impression SW met with pt in room at bedside to introduce self, explain role, and discuss discharge process. Not a veteran. No DME. No formal HCPOA ,however, reports it will be his wife.  Pt aware primary SW Chrisitna will f/u once she returns.   Beverly spoke with pt wife AJ [phone]) to introduce self, explain role, and discuss discharge process. SW informed primary SW Margreta Journey will f/u after team conference on Tuesday with updates.  Vinson Tietze A Victoriya Pol 12/14/2020, 1:44 PM

## 2020-12-15 LAB — BASIC METABOLIC PANEL
Anion gap: 7 (ref 5–15)
BUN: 5 mg/dL — ABNORMAL LOW (ref 6–20)
CO2: 22 mmol/L (ref 22–32)
Calcium: 8.4 mg/dL — ABNORMAL LOW (ref 8.9–10.3)
Chloride: 109 mmol/L (ref 98–111)
Creatinine, Ser: 0.57 mg/dL — ABNORMAL LOW (ref 0.61–1.24)
GFR, Estimated: >60 mL/min (ref >60)
Glucose, Bld: 118 mg/dL — ABNORMAL HIGH (ref 70–99)
Potassium: 3.9 mmol/L (ref 3.5–5.1)
Sodium: 138 mmol/L (ref 135–145)

## 2020-12-15 LAB — MAGNESIUM: Magnesium: 1.9 mg/dL (ref 1.7–2.4)

## 2020-12-15 MED ORDER — VITAMIN D (ERGOCALCIFEROL) 1.25 MG (50000 UNIT) PO CAPS
50000.0000 [IU] | ORAL_CAPSULE | ORAL | Status: DC
  Administered 2020-12-15: 50000 [IU] via ORAL
  Filled 2020-12-15: qty 1

## 2020-12-15 MED ORDER — OXYCODONE HCL 5 MG PO TABS
10.0000 mg | ORAL_TABLET | ORAL | Status: DC | PRN
Start: 2020-12-15 — End: 2020-12-21
  Administered 2020-12-15 – 2020-12-21 (×30): 10 mg via ORAL
  Filled 2020-12-15 (×32): qty 2

## 2020-12-15 MED ORDER — DOCUSATE SODIUM 100 MG PO CAPS
100.0000 mg | ORAL_CAPSULE | Freq: Two times a day (BID) | ORAL | Status: DC | PRN
  Administered 2020-12-15 – 2020-12-19 (×2): 100 mg via ORAL
  Filled 2020-12-15 (×3): qty 1

## 2020-12-15 NOTE — Progress Notes (Signed)
Occupational Therapy Session Note  Patient Details  Name: Henley Boettner MRN: [id number] Date of Birth: 1970-11-10  Today's Date: 12/16/2020 OT Individual Time: 5188-4166 and 1334-1401 OT Individual Time Calculation (min): 27 min and 27 min  Short Term Goals: Week 1:  OT Short Term Goal 1 (Week 1): STG = LTGs due to ELOS  Skilled Therapeutic Interventions/Progress Updates:    Pt greeted while sitting EOB, reporting soreness from PT this AM. Very proud that he broke several PT "records" today pertaining to ambulation and steps. He wanted to start session by changing shirts due to sweatiness. CGA for short distance ambulation to his closet without AD where pt picked out a new shirt. CGA for doffing shirt in standing and then he applied some deodorant. Pt returned to EOB to don his new shirt. He reported having some back pain and we discussed postural influence in regards to back pain. Guided him through gentle back stretches to decrease pain including lateral bends, twists, and flexion/extension of thoracic spine with education emphasis on upright neutral posture as appropriate. Pt very limited in his ability to perform exercises, even in a modified fashion due to additional pain from broken ribs + "dying" spleen. He reported not yet being due for pain medicine. Stated he wanted to take a nap to sleep off the pain. Left him in bed with all needs within reach.   2nd Session 1:1 tx (27 min) Pt greeted while sitting EOB, pain reportedly much better and pt motivated to "do better" during our second session together. Started with min squats at the sink with vcs for technique, 20 reps 2 sets. Pt ambulating without AD and supervision from bed to sink and then back. Taught him hamstring exercises using the gait belt as well as gentle ankle ROM that he could perform to enhance balance strategies. Reviewed gentle stretches for cervical spine + shoulders as well for pain relief. Pt appreciative, asked me to write down  exercises so that he could incorporate them into his daily routine post d/c. He remained sitting up at close of session, all needs within reach.   Therapy Documentation Precautions:  Precautions Precautions: Fall Precaution Comments: Monitor HR Restrictions Weight Bearing Restrictions: No  Pain: Pain Assessment Pain Scale: 0-10 Pain Score: 10-Worst pain ever Pain Type: Acute pain Pain Location: Rib cage Pain Orientation: Left Pain Descriptors / Indicators: Aching Pain Onset: Gradual Pain Intervention(s): Medication (See eMAR) ADL: ADL Grooming: Contact guard Where Assessed-Grooming: Standing at sink Upper Body Bathing: Setup, Supervision/safety Where Assessed-Upper Body Bathing: Shower Lower Body Bathing: Contact guard Where Assessed-Lower Body Bathing: Shower Upper Body Dressing: Minimal cueing Where Assessed-Upper Body Dressing: Edge of bed Lower Body Dressing: Minimal assistance Where Assessed-Lower Body Dressing: Edge of bed Toileting: Supervision/safety Where Assessed-Toileting: Bedside Commode Toilet Transfer: Minimal assistance Toilet Transfer Method: Proofreader: Psychiatric nurse: Minimal assistance Film/video editor Method: Designer, industrial/product: Grab bars, Shower seat with back     Therapy/Group: Individual Therapy  Xitlalli Newhard A Estiven Kohan 12/16/2020, 12:49 PM

## 2020-12-15 NOTE — Significant Event (Signed)
Patient refusing Lovenox injections. Patient reports nose bleeds with anticoagulant injection in the past. MD Raulkar notified. Order changed to SCDs. Patient agreeable to this. Will order.

## 2020-12-15 NOTE — Plan of Care (Signed)
  Problem: Consults Goal: RH GENERAL PATIENT EDUCATION Description: See Patient Education module for education specifics. Outcome: Progressing   Problem: RH PAIN MANAGEMENT Goal: RH STG PAIN MANAGED AT OR BELOW PT'S PAIN GOAL Description: < 3 on a 0-10 pain scale. Outcome: Progressing   Problem: RH KNOWLEDGE DEFICIT GENERAL Goal: RH STG INCREASE KNOWLEDGE OF SELF CARE AFTER HOSPITALIZATION Description: Patient will demonstrate knowledge of medication management and pain management with educational materials and handouts provided by staff independently at discharge. Outcome: Progressing   

## 2020-12-15 NOTE — Progress Notes (Signed)
PROGRESS NOTE   Subjective/Complaints: Pain continues to be severe- requests increase in oxycodone to 10mg  and have done so- advised trying to push out past 4 hours when possible.  Discussed improvements in magnesium and potassium  ROS: +pain, decreased endurance  Objective:   No results found. Recent Labs    12/13/20 0629 12/14/20 0543  WBC 8.0 8.2  HGB 8.2* 8.6*  HCT 26.1* 26.7*  PLT 249 282   Recent Labs    12/14/20 0543 12/15/20 0509  NA 136 138  K 3.1* 3.9  CL 108 109  CO2 21* 22  GLUCOSE 130* 118*  BUN <5* 5*  CREATININE 0.54* 0.57*  CALCIUM 7.9* 8.4*    Intake/Output Summary (Last 24 hours) at 12/15/2020 1554 Last data filed at 12/15/2020 0249 Gross per 24 hour  Intake --  Output 1000 ml  Net -1000 ml        Physical Exam: Vital Signs Blood pressure 124/81, pulse 91, temperature 98.9 F (37.2 C), temperature source Oral, resp. rate 18, height 5\' 10"  (1.778 m), weight 113.3 kg, SpO2 97 %. Gen: no distress, normal appearing HEENT: oral mucosa pink and moist, NCAT Cardio: Reg rate Chest: normal effort, normal rate of breathing Abd: soft, non-distended Ext: no edema Psych: pleasant, normal affect Musculoskeletal:     Cervical back: Normal range of motion. No rigidity.     Comments: UE- biceps 4+/5, triceps 4+/5, WE 5-/5, grip 5-/5, and FA 5-/5 B/L LE- HF 4/5, KE 4/5, KF 4/5, DF and PF 4+/5 B/L   Skin:    Comments: B/L forearm IV's- L doesn't work per pt- R OK- both look well Head shaved No skin breakdown on backside or feet  Neurological:     Mental Status: He is alert and oriented to person, place, and time.     Comments: Verbose with rapid/slurred speech at times. Needs occasional redirection but able to follow commands without difficulty.  Intact to light touch in all 4 extremities   Psychiatric:        Mood and Affect: Mood normal.        Behavior: Behavior normal.     Comments:  tangential    Assessment/Plan: 1. Functional deficits which require 3+ hours per day of interdisciplinary therapy in a comprehensive inpatient rehab setting. Physiatrist is providing close team supervision and 24 hour management of active medical problems listed below. Physiatrist and rehab team continue to assess barriers to discharge/monitor patient progress toward functional and medical goals  Care Tool:  Bathing    Body parts bathed by patient: Right arm, Left arm, Chest, Abdomen, Front perineal area, Buttocks, Right upper leg, Left upper leg, Face         Bathing assist Assist Level: Contact Guard/Touching assist     Upper Body Dressing/Undressing Upper body dressing   What is the patient wearing?: Pull over shirt    Upper body assist Assist Level: Minimal Assistance - Patient > 75%    Lower Body Dressing/Undressing Lower body dressing      What is the patient wearing?: Underwear/pull up     Lower body assist Assist for lower body dressing: Minimal Assistance - Patient > 75%  Toileting Toileting    Toileting assist Assist for toileting: Supervision/Verbal cueing     Transfers Chair/bed transfer  Transfers assist     Chair/bed transfer assist level: Minimal Assistance - Patient > 75%     Locomotion Ambulation   Ambulation assist      Assist level: Minimal Assistance - Patient > 75% Assistive device: No Device Max distance: 34ft   Walk 10 feet activity   Assist     Assist level: Minimal Assistance - Patient > 75% Assistive device: No Device   Walk 50 feet activity   Assist    Assist level: Minimal Assistance - Patient > 75% Assistive device: No Device    Walk 150 feet activity   Assist Walk 150 feet activity did not occur: Safety/medical concerns (fatigue, generalized weakness/deconditioning, decreased balance/postural control)         Walk 10 feet on uneven surface  activity   Assist Walk 10 feet on uneven surfaces  activity did not occur: Safety/medical concerns (fatigue, generalized weakness/deconditioning, decreased balance/postural control)         Wheelchair     Assist Is the patient using a wheelchair?: Yes Type of Wheelchair: Manual Wheelchair activity did not occur: Safety/medical concerns  Wheelchair assist level: Dependent - Patient 0% Max wheelchair distance: 166ft    Wheelchair 50 feet with 2 turns activity    Assist        Assist Level: Dependent - Patient 0%   Wheelchair 150 feet activity     Assist      Assist Level: Dependent - Patient 0%   Blood pressure 124/81, pulse 91, temperature 98.9 F (37.2 C), temperature source Oral, resp. rate 18, height 5\' 10"  (1.778 m), weight 113.3 kg, SpO2 97 %.  Medical Problem List and Plan: 1.  Debility secondary to [facility] hospital stay from pancreatitis/intubation, splenic hematoma s/p embolization; and flash pulm edema             -patient may  shower             -ELOS/Goals: 10-14 days- supervision  Continue CIR 2.  Impaired mobility: SCDs ordered.  3. Pain: Tylenol 650 mg qid. Increase oxycodone to 10mg  q4H prn.  4. Mood: LCSW to follow for evalaution and support.              -antipsychotic agents: N/A 5. Neuropsych: This patient may be intermittently capable of making decisions on his own behalf. 6. Skin/Wound Care: Routine pressure relief measures.  7. Fluids/Electrolytes/Nutrition: Monitor I/O. Check CMET in am.  8. DOE: Encourage pulmonary toilet.  Question fluid overload--wt up by average of 15 lbs?              --Monitor for signs of overload and check daily standing weights   9. SBO/Ileus: Has resolved. Augment bowel program as indicated             --Keep K>4.0 and Mg>2.0 to prevent recurrence             --add Kdur for supplement as on lasix and K<4.0  -discussed with patient that K+ and Magnesium are improving, repeat both Monday 10. HTN/Tachycardia: Monitor HR TID--on Toprol XL, Lisinopril and  Furosemide.  11. Abnormal LFTs:Resolving--recheck in am 12. Acute blood loss anemia: Continue to monitor--especially with lovenox on board             --stable in 7-8 range.  13. ETOH abuse/Hepatic steatosis: On Thiamine and Folic acid.  14. Nose bleed: Monitor for recurrence.  15. Low protein: start Juven 16. Hyperglycemia: d/c Ensure    LOS: 2 days A FACE TO FACE EVALUATION WAS PERFORMED  Drema Pry Jamariyah Johannsen 12/15/2020, 3:54 PM

## 2020-12-16 NOTE — Progress Notes (Signed)
Occupational Therapy Session Note  Patient Details  Name: Ernest Rodgers MRN: [id number] Date of Birth: Mar 07, 1971  Today's Date: 12/17/2020 OT Individual Time: 1104-1200 OT Individual Time Calculation (min): 56 min   Short Term Goals: Week 1:  OT Short Term Goal 1 (Week 1): STG = LTGs due to ELOS  Skilled Therapeutic Interventions/Progress Updates:    Pt greeted in bed, very proud of his therapeutic achievements from yesterday and this morning. He reported he showered last night with setup assistance from nursing, did not want to shower today, already dressed. To work on IADL retraining, escorted pt to the ADL apartment via w/c. He reports making the bed at home, wanted to practice with bed that was wider like in his camper vs bed in room. He ambulated around the bed with close supervision assistance to apply sheet + comforter on the bed, noted 1 small LOB after bumping into the base of the bed, able to recover balance unassisted. Note that he did need 2 seated rest breaks before finishing this ADL task due to limited endurance. Sit<stand from movable recliner chair completed with supervision assist. He also had a BM in the adjacent bathroom, distant supervision for toilet transfer + tasks, using his [facility] for functional ambulation. Pt reported he felt like he "vagaled" post BM. BP assessed when he was transported back to the room, reading 107/72. Per pt, this is his normal. Close supervision for ambulatory transfer back to bed using SPC and assisted pt with positioning his hot pack over Lt shoulder. Pt reported having a "gas bubble" in Lt UB. Very painful. Rest breaks provided throughout session and also informed RN. Pt remained comfortably in bed at close of session, all needs within reach. Tx focus placed on IADL retraining, higher level balance, and activity tolerance.   Therapy Documentation Precautions:  Precautions Precautions: Fall Precaution Comments: Monitor HR Restrictions Weight Bearing  Restrictions: No  Pain: Pain Assessment Pain Scale: 0-10 Pain Score: 5  Pain Type: Acute pain Pain Location: Rib cage Pain Orientation: Left Pain Descriptors / Indicators: Sharp Pain Frequency: Intermittent Pain Onset: On-going Patients Stated Pain Goal: 4 Pain Intervention(s): Medication (See eMAR) (oxycodone) ADL: ADL Grooming: Contact guard Where Assessed-Grooming: Standing at sink Upper Body Bathing: Setup, Supervision/safety Where Assessed-Upper Body Bathing: Shower Lower Body Bathing: Contact guard Where Assessed-Lower Body Bathing: Shower Upper Body Dressing: Minimal cueing Where Assessed-Upper Body Dressing: Edge of bed Lower Body Dressing: Minimal assistance Where Assessed-Lower Body Dressing: Edge of bed Toileting: Supervision/safety Where Assessed-Toileting: Bedside Commode Toilet Transfer: Minimal assistance Toilet Transfer Method: Proofreader: Psychiatric nurse: Minimal assistance Film/video editor Method: Designer, industrial/product: Grab bars, Shower seat with back     Therapy/Group: Individual Therapy  Azalee Weimer A Michela Herst 12/17/2020, 12:28 PM

## 2020-12-16 NOTE — Plan of Care (Signed)
  Problem: Consults Goal: RH GENERAL PATIENT EDUCATION Description: See Patient Education module for education specifics. Outcome: Progressing   Problem: RH PAIN MANAGEMENT Goal: RH STG PAIN MANAGED AT OR BELOW PT'S PAIN GOAL Description: < 3 on a 0-10 pain scale. Outcome: Progressing   Problem: RH KNOWLEDGE DEFICIT GENERAL Goal: RH STG INCREASE KNOWLEDGE OF SELF CARE AFTER HOSPITALIZATION Description: Patient will demonstrate knowledge of medication management and pain management with educational materials and handouts provided by staff independently at discharge. Outcome: Progressing   Problem: Consults Goal: RH GENERAL PATIENT EDUCATION Description: See Patient Education module for education specifics. Outcome: Progressing

## 2020-12-16 NOTE — Progress Notes (Signed)
Physical Therapy Session Note  Patient Details  Name: Ernest Rodgers MRN: [id number] Date of Birth: 1971/02/27  Today's Date: 12/16/2020 PT Individual Time: Session 1:  800-915    Session 2: 1445-1530 PT Individual Time Calculation (min):  Session 1: 75 min        Session 2: 45 min  Short Term Goals: Week 1:  PT Short Term Goal 1 (Week 1): STG=LTG due to LOS  Skilled Therapeutic Interventions/Progress Updates:   Session 1: Pt received seated in bed and agreeable to PT with a report of 0/10 pain. Pt transferred outdoors for therapy session for time conservation. Outside, pt participated in ambulation of 2 x 130ft with CGA + no AD. Pt then amb uneven surface for 146ft with CGA + no AD. For all attempts, pt noticed to have shuffle. Thus, pt practiced increasing step length for 3 rounds of ambulation for 58ft, decreasing step length from 20 steps to 13 steps. Pt then transferred indoors to practice steps, completing 2 rounds of 12 steps with CGA + B hand rails. Pt ended session with functional transfer to bed with CGA and no AD. At end of session, pt was left seated in bed with call bell and all needs in reach. Per RN, pt not required to have bed alarm.  Session 2: Pt received seated in bed and agreeable to PT with a report of 7/10 pain. Pt transferred to therapy gym for time conservation. Pt practiced NuStep for 8 mins at Level 5 with 92 bpm for HR at end of activity. Pt then amb up 64ft ramp and across un even surface with unilatral rail + CGA. Pt ended session with final amb of 75ft with CGA + no AD.  At end of session, pt was left seated in bed with call bell and all needs in reach. Per RN, pt not required to have bed alarm.   Therapy Documentation   Pain: Pain Assessment Pain Scale: 0-10 Pain Score: 7  Pain Type: Acute pain Pain Location: Rib cage Pain Orientation: Left Pain Descriptors / Indicators: Aching Pain Onset: Gradual Pain Intervention(s): Medication (See  eMAR)   Therapy/Group: Individual Therapy  Perrin Maltese, PT, DPT 12/16/2020, 3:47 PM

## 2020-12-16 NOTE — IPOC Note (Signed)
Overall Plan of Care [facility]) Patient Details Name: Ernest Rodgers MRN: [id number] DOB: 08-Jun-1970  Admitting Diagnosis: Debility  Hospital Problems: Principal Problem:   Debility     Functional Problem List: Nursing Endurance, Medication Management, Pain, Safety  PT Balance, Endurance, Motor, Pain, Sensory, Skin Integrity  OT Balance, Endurance, Motor, Pain, Safety  SLP    TR         Basic ADL's: OT Grooming, Bathing, Dressing, Toileting     Advanced  ADL's: OT Laundry     Transfers: PT Bed Mobility, Bed to Chair, Car, Occupational psychologist, Research scientist (life sciences): PT Ambulation, Psychologist, prison and probation services, Stairs     Additional Impairments: OT None  SLP        TR      Anticipated Outcomes Item Anticipated Outcome  Self Feeding    Swallowing      Basic self-care  Mod I  Toileting  Mod I   Bathroom Transfers Mod I  Bowel/Bladder  N/A  Transfers  mod I  Locomotion  mod I  Communication     Cognition     Pain  <3  Safety/Judgment  supervision and no falls   Therapy Plan: PT Intensity: Minimum of 1-2 x/day ,45 to 90 minutes PT Frequency: 5 out of 7 days PT Duration Estimated Length of Stay: 10 days OT Intensity: Minimum of 1-2 x/day, 45 to 90 minutes OT Frequency: 5 out of 7 days OT Duration/Estimated Length of Stay: 10-12 days     Due to the current state of emergency, patients may not be receiving their 3-hours of Medicare-mandated therapy.   Team Interventions: Nursing Interventions Patient/Family Education, Disease Management/Prevention, Pain Management, Medication Management, Discharge Planning  PT interventions Ambulation/gait training, Discharge planning, Functional mobility training, Psychosocial support, Therapeutic Activities, Balance/vestibular training, Disease management/prevention, Neuromuscular re-education, Skin care/wound management, Therapeutic Exercise, Wheelchair propulsion/positioning, DME/adaptive equipment instruction, Pain  management, Splinting/orthotics, UE/LE Strength taining/ROM, Community reintegration, Equities trader education, Museum/gallery curator, UE/LE Coordination activities  OT Interventions Warden/ranger, Firefighter, Discharge planning, Disease mangement/prevention, Fish farm manager, Functional mobility training, Pain management, Patient/family education, Psychosocial support, Self Care/advanced ADL retraining, Skin care/wound managment, Therapeutic Activities, Therapeutic Exercise, UE/LE Strength taining/ROM  SLP Interventions    TR Interventions    SW/CM Interventions Discharge Planning, Psychosocial Support, Patient/Family Education   Barriers to Discharge MD  Medical stability  Nursing Decreased caregiver support, Home environment access/layout, Lack of/limited family support, Weight, Medication compliance Travel nurse, lives in 1 level RV trailer with 5 steps to enter. Wife also works as a Arts administrator and can provide assist at discharge.  PT Inaccessible home environment, Home environment access/layout 6 STE with 1 rail  OT Home environment access/layout currently staying in RV due to traveling RN  SLP      SW Decreased caregiver support, Lack of/limited family support     Team Discharge Planning: Destination: PT-Home ,OT- Home , SLP-  Projected Follow-up: PT-Outpatient PT, OT-  Outpatient OT, SLP-  Projected Equipment Needs: PT-To be determined, OT- None recommended by OT, SLP-  Equipment Details: PT-has none, OT-  Patient/family involved in discharge planning: PT- Patient,  OT-Patient, SLP-   MD ELOS: 10-14 days Medical Rehab Prognosis:  Excellent Assessment: Ernest Rodgers is a 50 year old man admitted to CIR with debility secondary to [facility] hospital stay from pancreatitis/intubation, splenic hematoma s/p embolization; and flash pulm edema. Course complicated by pain, hyperglycemia, HTN/tachycardia. Medications are being managed, and labs and vitals  are being monitored regularly.  See Team Conference Notes for weekly updates to the plan of care

## 2020-12-17 LAB — CBC
HCT: 30.2 % — ABNORMAL LOW (ref 39.0–52.0)
Hemoglobin: 9.7 g/dL — ABNORMAL LOW (ref 13.0–17.0)
MCH: 34.2 pg — ABNORMAL HIGH (ref 26.0–34.0)
MCHC: 32.1 g/dL (ref 30.0–36.0)
MCV: 106.3 fL — ABNORMAL HIGH (ref 80.0–100.0)
Platelets: 371 10*3/uL (ref 150–400)
RBC: 2.84 MIL/uL — ABNORMAL LOW (ref 4.22–5.81)
RDW: 18.6 % — ABNORMAL HIGH (ref 11.5–15.5)
WBC: 6.4 10*3/uL (ref 4.0–10.5)
nRBC: 0 % (ref 0.0–0.2)

## 2020-12-17 LAB — BASIC METABOLIC PANEL
Anion gap: 11 (ref 5–15)
BUN: 11 mg/dL (ref 6–20)
CO2: 23 mmol/L (ref 22–32)
Calcium: 9.2 mg/dL (ref 8.9–10.3)
Chloride: 100 mmol/L (ref 98–111)
Creatinine, Ser: 0.56 mg/dL — ABNORMAL LOW (ref 0.61–1.24)
GFR, Estimated: >60 mL/min (ref >60)
Glucose, Bld: 113 mg/dL — ABNORMAL HIGH (ref 70–99)
Potassium: 4.1 mmol/L (ref 3.5–5.1)
Sodium: 134 mmol/L — ABNORMAL LOW (ref 135–145)

## 2020-12-17 LAB — MAGNESIUM: Magnesium: 1.7 mg/dL (ref 1.7–2.4)

## 2020-12-17 MED ORDER — CYCLOBENZAPRINE HCL 5 MG PO TABS
5.0000 mg | ORAL_TABLET | Freq: Three times a day (TID) | ORAL | Status: DC | PRN
  Administered 2020-12-17 – 2020-12-20 (×6): 5 mg via ORAL
  Filled 2020-12-17 (×7): qty 1

## 2020-12-17 MED ORDER — MAGNESIUM GLUCONATE 500 MG PO TABS
500.0000 mg | ORAL_TABLET | Freq: Every day | ORAL | Status: DC
  Administered 2020-12-17 – 2020-12-18 (×2): 500 mg via ORAL
  Filled 2020-12-17 (×2): qty 1

## 2020-12-17 MED ORDER — SIMETHICONE 80 MG PO CHEW
160.0000 mg | CHEWABLE_TABLET | Freq: Four times a day (QID) | ORAL | Status: DC
  Administered 2020-12-17 – 2020-12-21 (×17): 160 mg via ORAL
  Filled 2020-12-17 (×19): qty 2

## 2020-12-17 MED ORDER — ADULT MULTIVITAMIN W/MINERALS CH: 1.0000 | ORAL_TABLET | Freq: Every day | ORAL | Status: AC

## 2020-12-17 NOTE — Progress Notes (Signed)
PROGRESS NOTE   Subjective/Complaints:  Pt reports pain to L upper shoulder- almost feels like has gas bubble under L ribs causing the pain- I agreed this is possible/probable, and can cause radiation of pain to L shoulder- common.   ROS: Pt denies SOB, abd pain, CP, N/V/C/D, and vision changes   Objective:   No results found. Recent Labs    12/17/20 0559  WBC 6.4  HGB 9.7*  HCT 30.2*  PLT 371   Recent Labs    12/15/20 0509 12/17/20 0559  NA 138 134*  K 3.9 4.1  CL 109 100  CO2 22 23  GLUCOSE 118* 113*  BUN 5* 11  CREATININE 0.57* 0.56*  CALCIUM 8.4* 9.2    Intake/Output Summary (Last 24 hours) at 12/17/2020 1518 Last data filed at 12/17/2020 0800 Gross per 24 hour  Intake --  Output 1225 ml  Net -1225 ml        Physical Exam: Vital Signs Blood pressure 117/79, pulse 88, temperature 97.8 F (36.6 C), temperature source Oral, resp. rate 18, height 5\' 10"  (1.778 m), weight 112.4 kg, SpO2 94 %.   General: awake, alert, appropriate, stood to put on shorts; poor endurance; NAD HENT: conjugate gaze; oropharynx moist CV: regular rate; no JVD Pulmonary: CTA B/L; no W/R/R- good air movement GI: soft, NT, ND, (+)BS Psychiatric: appropriate; interactive Neurological: Ox3  Musculoskeletal:     Cervical back: Normal range of motion. No rigidity.     Comments: UE- biceps 4+/5, triceps 4+/5, WE 5-/5, grip 5-/5, and FA 5-/5 B/L LE- HF 4/5, KE 4/5, KF 4/5, DF and PF 4+/5 B/L   Skin:    Comments: B/L forearm IV's- L doesn't work per pt- R OK- both look well Head shaved No skin breakdown on backside or feet  Neurological:     Mental Status: He is alert and oriented to person, place, and time.     Comments: Verbose with rapid/slurred speech at times. Needs occasional redirection but able to follow commands without difficulty.  Intact to light touch in all 4 extremities    Assessment/Plan: 1. Functional  deficits which require 3+ hours per day of interdisciplinary therapy in a comprehensive inpatient rehab setting. Physiatrist is providing close team supervision and 24 hour management of active medical problems listed below. Physiatrist and rehab team continue to assess barriers to discharge/monitor patient progress toward functional and medical goals  Care Tool:  Bathing    Body parts bathed by patient: Right arm, Left arm, Chest, Abdomen, Front perineal area, Buttocks, Right upper leg, Left upper leg, Face         Bathing assist Assist Level: Contact Guard/Touching assist     Upper Body Dressing/Undressing Upper body dressing   What is the patient wearing?: Pull over shirt    Upper body assist Assist Level: Minimal Assistance - Patient > 75%    Lower Body Dressing/Undressing Lower body dressing      What is the patient wearing?: Underwear/pull up     Lower body assist Assist for lower body dressing: Minimal Assistance - Patient > 75%     Toileting Toileting    Toileting assist Assist for toileting: Independent  with assistive device Assistive Device Comment: urinal   Transfers Chair/bed transfer  Transfers assist     Chair/bed transfer assist level: Contact Guard/Touching assist     Locomotion Ambulation   Ambulation assist      Assist level: Contact Guard/Touching assist Assistive device: Cane-straight Max distance: 138ft   Walk 10 feet activity   Assist     Assist level: Contact Guard/Touching assist Assistive device: Cane-straight   Walk 50 feet activity   Assist    Assist level: Contact Guard/Touching assist Assistive device: Cane-straight    Walk 150 feet activity   Assist Walk 150 feet activity did not occur: Safety/medical concerns (fatigue, generalized weakness/deconditioning, decreased balance/postural control)  Assist level: Contact Guard/Touching assist Assistive device: Cane-straight    Walk 10 feet on uneven surface   activity   Assist Walk 10 feet on uneven surfaces activity did not occur: Safety/medical concerns (fatigue, generalized weakness/deconditioning, decreased balance/postural control)         Wheelchair     Assist Is the patient using a wheelchair?: Yes Type of Wheelchair: Manual Wheelchair activity did not occur: Safety/medical concerns  Wheelchair assist level: Dependent - Patient 0% Max wheelchair distance: 162ft    Wheelchair 50 feet with 2 turns activity    Assist        Assist Level: Dependent - Patient 0%   Wheelchair 150 feet activity     Assist      Assist Level: Dependent - Patient 0%   Blood pressure 117/79, pulse 88, temperature 97.8 F (36.6 C), temperature source Oral, resp. rate 18, height 5\' 10"  (1.778 m), weight 112.4 kg, SpO2 94 %.  Medical Problem List and Plan: 1.  Debility secondary to [facility] hospital stay from pancreatitis/intubation, splenic hematoma s/p embolization; and flash pulm edema             -patient may  shower             -ELOS/Goals: 10-14 days- supervision  Continue CIR- PT, OT 2.  Impaired mobility: SCDs ordered.  3. Pain: Tylenol 650 mg qid. Increase oxycodone to 10mg  q4H prn.   9/5- will give gas x 160 mg QID and suggested walking with therapy to get gas bubble out.  4. Mood: LCSW to follow for evalaution and support.              -antipsychotic agents: N/A 5. Neuropsych: This patient may be intermittently capable of making decisions on his own behalf. 6. Skin/Wound Care: Routine pressure relief measures.  7. Fluids/Electrolytes/Nutrition: Monitor I/O. Check CMET in am.  8. DOE: Encourage pulmonary toilet.  Question fluid overload--wt up by average of 15 lbs?              --Monitor for signs of overload and check daily standing weights   9. SBO/Ileus: Has resolved. Augment bowel program as indicated             --Keep K>4.0 and Mg>2.0 to prevent recurrence             --add Kdur for supplement as on lasix and  K<4.0  -discussed with patient that K+ and Magnesium are improving, repeat both Monday 10. HTN/Tachycardia: Monitor HR TID--on Toprol XL, Lisinopril and Furosemide.   9/5- BP controlled and HR 80s- con't regimen 11. Abnormal LFTs:Resolving--recheck in am  9/5- Last LFTs 9/2- looked resolved 12. Acute blood loss anemia: Continue to monitor--especially with lovenox on board             --stable  in 7-8 range.   9/5- Hb 9.7- improving- con't regimen 13. ETOH abuse/Hepatic steatosis: On Thiamine and Folic acid.  14. Nose bleed: Monitor for recurrence.  15. Low protein: start Juven 16. Hyperglycemia: d/c Ensure    LOS: 4 days A FACE TO FACE EVALUATION WAS PERFORMED  Ernest Rodgers 12/17/2020, 3:18 PM

## 2020-12-17 NOTE — Progress Notes (Signed)
Physical Therapy Session Note  Patient Details  Name: Ernest Rodgers MRN: [id number] Date of Birth: 1970-11-05  Today's Date: 12/17/2020 PT Individual Time: 9509-3267 PT Individual Time Calculation (min): 60 min   Short Term Goals: Week 1:  PT Short Term Goal 1 (Week 1): STG=LTG due to LOS  Skilled Therapeutic Interventions/Progress Updates:  Pt sitting EOB.  He reported pain deep to L scapula 8/10 due to gas bubble.  Supine> sit independently using bed rail. BP 117/79 and HR 87 at rest. Pt donned shoes with set up.  Pt requested using toilet.  Sit> stand with SPC with supervision.   Gait in congested room into BR with SPC and supervision.  Pt stood to urinate.  Hand washing at sink in standing independently.  Therapeutic exercise performed with LEs to increase strength for functional mobility: Use of Kinetron at resistance 35 cm/sec x 2 min for activity tolerance.  To focus on L gluts, use of LLE only on r footplate with PT providing resistance on L footplate, x 2 x 25 cycles.  Pt SOB after q bout of 25 cycles, needing <1 minute to recover.  Sustained stretch bil heel cords and balance challenge, standing with forefeet on blue wedge with bil UE support x 1 minute.  Pt unable to tolerate further due to L subscapular pain.  Advanced gait training with SPC through obstacle course of 8 cones,  with 1 near -stumble without LOB, close supervision.  Gait backwards with SPC x 40' requiring trunk and cervical rotation to visualize wc , close supervision but no LOB.   Ambulatory transfer to bed; sit> supine independently.  At end of session, PT secured K pad around pt's L shoulder.  Needs at hand. Pt stated that has been cleared for bed alarm being off as he is unable to use urinal if it is set.     Therapy Documentation Precautions:  Precautions Precautions: Fall Precaution Comments: Monitor HR Restrictions Weight Bearing Restrictions: No       Therapy/Group: Individual  Therapy  Ernest Rodgers 12/17/2020, 4:19 PM

## 2020-12-17 NOTE — Discharge Instructions (Addendum)
Inpatient Rehab Discharge Instructions  Ernest Rodgers Discharge date and time:  12/21/20  Activities/Precautions/ Functional Status: Activity: no lifting, driving, or strenuous exercise till cleared by MD Diet: low fat, low cholesterol diet Wound Care: none needed   Functional status:  ___ No restrictions     ___ Walk up steps independently ___ 24/7 supervision/assistance   ___ Walk up steps with assistance _X__ Intermittent supervision/assistance  _X__ Bathe/dress independently ___ Walk with walker     ___ Bathe/dress with assistance ___ Walk Independently    ___ Shower independently ___ Walk with assistance    ___ Shower with assistance _X__ No alcohol     ___ Return to work/school ________    COMMUNITY REFERRALS UPON DISCHARGE:    Outpatient: PT                 Agency: [facility] Phone: [phone]              Appointment Date/Time: TBD  Special Instructions:  Follow up with PCP next week for check of potassium levels and further adjustment.    My questions have been answered and I understand these instructions. I will adhere to these goals and the provided educational materials after my discharge from the hospital.  Patient/Caregiver Signature _______________________________ Date __________  Clinician Signature _______________________________________ Date __________  Please bring this form and your medication list with you to all your follow-up doctor's appointments.

## 2020-12-17 NOTE — Progress Notes (Signed)
Physical Therapy Session Note  Patient Details  Name: Ernest Rodgers MRN: [id number] Date of Birth: 1970-12-11  Today's Date: 12/17/2020 PT Individual Time: 0900-0955 PT Individual Time Calculation (min): 55 min   Short Term Goals: Week 1:  PT Short Term Goal 1 (Week 1): STG=LTG due to LOS  Skilled Therapeutic Interventions/Progress Updates:   Received pt semi-reclined in bed, pt agreeable to PT treatment, and reported 10/10 pain in L shoulder/rib/scapula as well as uncomfortable "gas bubble" in abdomen traveling up his L anterior chest (premedicated). Repositioning, rest breaks, and distraction done to reduce pain levels. Pt reported not having BM in 2 days and only getting 45 minutes of sleep last night due to discomfort and feeling exhausted this morning but motivated to participate in therapy. Session with emphasis on functional mobility/transfers, generalized strengthening, dynamic standing balance/coordination, gait training, and improved activity tolerance. Pt transferred semi-reclined<>sitting EOB with supervision with use of bedrails. Stand<>pivot bed<>WC without AD and CGA. Pt transported to/from room on 5C in [facility] total A for time management and energy conservation purposes. Pt then ambulated 185ft x 2 trials; trial 1 without AD and CGA/min A and trial 2 with SPC and CGA. When ambulating without SPC pt demonstrates wide BOS with lateral unsteadiness with frequent but mild LOB. However when using [facility] pt demonstrates improved step pattern, foot clearance, with less frequent LOB. Educated pt on technique for ambulating with cane and pt demonstrated good carry over. Recommended pt use SPC initially upon D/C for improved stability; pt in agreement. While resting, pt performed the following exercises with emphasis on pain management/reduction and L shoulder ROM: -posterior shoulder rolls x20 -rows with light orange TB x20 Worked on dynamic standing balance performing alternating toe taps to 3in step  3x20 without UE support and min A for balance to fatigue with emphasis on endurance and LE strength. Stand<>pivot mat<>WC without AD and CGA. Pt then ambulated 68ft with SPC and close supervision to bed. Sit<>supine with supervision. Pt reported feeling better after therapy session but requesting stool softener; RN made aware. Concluded session with pt semi-reclined in bed, needs within reach, and bed alarm on.   Therapy Documentation Precautions:  Precautions Precautions: Fall Precaution Comments: Monitor HR Restrictions Weight Bearing Restrictions: No  Therapy/Group: Individual Therapy Tye Vigo PT, DPT   12/17/2020, 7:19 AM

## 2020-12-18 ENCOUNTER — Inpatient Hospital Stay (HOSPITAL_COMMUNITY): Payer: Managed Care, Other (non HMO)

## 2020-12-18 LAB — VITAMIN B1: Vitamin B1 (Thiamine): 183.6 nmol/L (ref 66.5–200.0)

## 2020-12-18 MED ORDER — TRAZODONE HCL 100 MG PO TABS
100.0000 mg | ORAL_TABLET | Freq: Every evening | ORAL | Status: DC | PRN
  Administered 2020-12-19 – 2020-12-20 (×3): 100 mg via ORAL
  Filled 2020-12-18 (×3): qty 1

## 2020-12-18 MED ORDER — POTASSIUM CHLORIDE CRYS ER 20 MEQ PO TBCR
40.0000 meq | EXTENDED_RELEASE_TABLET | Freq: Every day | ORAL | Status: DC
  Administered 2020-12-19 – 2020-12-21 (×3): 40 meq via ORAL
  Filled 2020-12-18 (×3): qty 2

## 2020-12-18 NOTE — Progress Notes (Signed)
Occupational Therapy Note  Patient Details  Name: Ernest Rodgers MRN: [id number] Date of Birth: [birth year]  Attempted to make up missed time due to off the unit for xray. Pt just finishing PT session and reporting pain, limited endurance, and decreased breath support impacting participation even in PT session. Pt declined any therapy at this time.  Rosalio Loud 12/18/2020, 2:52 PM

## 2020-12-18 NOTE — Progress Notes (Signed)
Patient ID: Ernest Rodgers, male   DOB: 29-Mar-1971, 50 y.o.   MRN: [id number]  [address] [place], [place] [address]

## 2020-12-18 NOTE — Patient Care Conference (Signed)
Inpatient RehabilitationTeam Conference and Plan of Care Update Date: 12/18/2020   Time: 1:42 PM    Patient Name: Ernest Rodgers      Medical Record Number: [id number]  Date of Birth: 1971-01-10 Sex: Male         Room/Bed: 5C02C/5C02C-01 Payor Info: Payor: CIGNA / Plan: CIGNA MANAGED / Product Type: *No Product type* /    Admit Date/Time:  12/13/2020  3:01 PM  Primary Diagnosis:  Debility  Hospital Problems: Principal Problem:   Debility    Expected Discharge Date: Expected Discharge Date: 12/21/20  Team Members Present: Physician leading conference: Dr. Sula Soda Social Worker Present: Lavera Guise, BSW Nurse Present: Kennyth Arnold, RN PT Present: Raechel Chute, PT OT Present: Rosalio Loud, OT PPS Coordinator present : Fae Pippin, SLP     Current Status/Progress Goal Weekly Team Focus  Bowel/Bladder   continent b/b, lbm 12/17/20  remain continent      Swallow/Nutrition/ Hydration             ADL's   CGA - Close supervision mobility and transfers with [facility], Supervision bathing, CGA LB dressing and UB in standing, decreased endurance/activity tolerance  Mod I overall, Supervision/setup bathing and shower transfer  ADL retraining, functional mobility, activity tolerance/endurance, d/c planning   Mobility   bed mobility supervision, transfers without AD CGA/min A, gait 174ft without AD CGA/min A  mod I, supervision for stairs  functional mobility/transfers, generalized strengthening, dynamic standing balance/coordination, gait training, D/C planning, and improved endurance with activity.   Communication             Safety/Cognition/ Behavioral Observations            Pain   shoulder pain, rib cage pain, k-pad and lidoderm patch  < 3  assess pain q 4hr and prn   Skin   CDI  remain free from breakdown  assess skin q shift and prn     Discharge Planning:  Pt discharging back to [facility] with spouse. Spouse can provide intermittent A (travel nurse)    Team Discussion: Chest x-ray negative, HTN issue, adding Magnesium and K+. If still having issue with constipation can give additional medications. Continent B/B, shoulder and rib cage pain. K-pad and Lidocaine patches helping. Pain interferes with sleep. Supervision for bed mobility, transfers with cane. Walking > 150 ft without assistive device. Limited by pain/gas. Mod I goals, supervision for stairs. Wants a shower. Contact guard/supervision mobility, contact guard lower body, bathing and dressing. Very deconditioned. Mod I and supervision goals.  Patient on target to meet rehab goals: yes  *See Care Plan and progress notes for long and short-term goals.   Revisions to Treatment Plan:  MD making medication adjustments.  Teaching Needs: Family education, medication management, pain management, transfer training, gait training, balance training, endurance training, stair training, safety awareness.  Current Barriers to Discharge: Decreased caregiver support, Medical stability, Home enviroment access/layout, Lack of/limited family support, Weight, and Medication compliance  Possible Resolutions to Barriers: Continue current medications, provide emotional support.     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, left abdominal pain radiating into his scapula, hypertension, low magnesium and potassium levels, severe obesity Possible Resolutions to [employer] Focus: continue precautions, continue simethicone and bowel regimen, continue to monitor BP TID, continue supplementation and monitoring of magnesium and potassium  levels   Continued Need for Acute Rehabilitation Level of Care: The patient requires daily medical management by a physician with specialized  training in physical medicine and rehabilitation for the following reasons: Direction of a multidisciplinary physical rehabilitation program to maximize functional independence : Yes Medical management of patient stability for increased activity during participation in an intensive rehabilitation regime.: Yes Analysis of laboratory values and/or radiology reports with any subsequent need for medication adjustment and/or medical intervention. : Yes   I attest that I was present, lead the team conference, and concur with the assessment and plan of the team.   Kennyth Arnold G 12/18/2020, 1:50 PM

## 2020-12-18 NOTE — Progress Notes (Signed)
Physical Therapy Session Note  Patient Details  Name: Ernest Rodgers MRN: [id number] Date of Birth: 1971-03-17  Today's Date: 12/18/2020 PT Individual Time: 0932-6712 PT Individual Time Calculation (min): 14 min   Short Term Goals: Week 1:  PT Short Term Goal 1 (Week 1): STG=LTG due to LOS  Skilled Therapeutic Interventions/Progress Updates:    Patient in x ray initially, but returned and attempted to see pt.  Reported in too much pain, but did want to use the bathroom and get help to reposition.  Patient to sitting with increased time, pain and S.  Performed sit to stand after assist to don shoes with S.  Used cane to ambulated 6' to and from bathroom with S.  Standing at sink to wash hands with S.  Patient to supine with S.  Positioned for comfort with pillows, k pad and needs in reach.  HR 95, SpO2 95% and BP 111/67 after up to bathroom.  Missed 46 minutes of skilled PT due to in x-ray, then in too much pain.   Therapy Documentation Precautions:  Precautions Precautions: Fall Precaution Comments: Monitor HR Restrictions Weight Bearing Restrictions: No General: PT Amount of Missed Time (min): 46 Minutes PT Missed Treatment Reason: Xray & pain    Pain: Pain Assessment Pain Score: 8     Therapy/Group: Individual Therapy  Elray Mcgregor Harwick, PT 12/18/2020, 12:04 PM

## 2020-12-18 NOTE — Progress Notes (Signed)
Patient ID: Ernest Rodgers, male   DOB: March 31, 1971, 50 y.o.   MRN: [id number] Team Conference Report to Patient/Family  Team Conference discussion was reviewed with the patient and caregiver, including goals, any changes in plan of care and target discharge date.  Patient and caregiver express understanding and are in agreement.  The patient has a target discharge date of 12/21/20.  SW met with pt and spouse to discuss conference updates. Pt still experiencing pain. Doing well overall. Pt happy with set d/c date. Spouse requesting HH. Sw will follow up based on pt insurance and service area. Pt will like scripts sent to [facility] on [address]. No additional questions or concerns, sw will cont to follow up.  Dyanne Iha 12/18/2020, 3:24 PM

## 2020-12-18 NOTE — Progress Notes (Signed)
Physical Therapy Session Note  Patient Details  Name: Ernest Rodgers MRN: [id number] Date of Birth: [birth year]  Today's Date: 12/18/2020 PT Individual Time: 3267-1245 and 8099-8338 PT Individual Time Calculation (min): 54 min and 33 min  Short Term Goals: Week 1:  PT Short Term Goal 1 (Week 1): STG=LTG due to LOS  Skilled Therapeutic Interventions/Progress Updates:   Treatment Session 1 Received pt semi-reclined in bed, pt agreeable to PT treatment, and reported "gas bubble" pain 10/10 in L rib cage radiating to L scapula and collarbone. Pt reported the pain is worse today but he was able to get some sleep last night. Of note, pt also extremely diaphoretic but reports vitals signs WNL - MD notified and present for morning rounds and ordered chest X-ray to rule out pneumothorax. Session with emphasis on functional mobility/transfers, generalized strengthening, dynamic standing balance/coordination, gait training, and improved activity tolerance. Pt transferred semi-reclined<>sitting EOB with HOB elevated and use of bedrails with supervision. Pt ambulated 62ft without AD and CGA to sink and stood and applied deodorant and combed hair with supervision. Pt transported to/from room on 5C in [facility] total A for time management and energy conservation purposes. Sit<>stands with SPC and supervision throughout session. Pt ambulated 158ft x 2 trials with SPC and supervision/CGA with 1 L lateral LOB when turning requring light min A to correct. Pt demonstrates decreased cadence with decreased arm swing and decreased step/stride length but much improved gait pattern with SPC. Pt required numerous rest breaks and frequently grimacing and grabbing L side, reporting having shallow, short breaths - O2 sat 96%. Pt then requested to return to room and ambulated 11ft with [facility] and supervision to bed and transferred sit<>semi-reclined with supervision. Positioned k-pad along L side for pain relief. Concluded session with pt  semi-reclined in bed with all needs within reach. RN updated on pt's current status.   Treatment Session 2 Received pt semi-reclined in bed, pt agreeable to PT treatment, and reported pain 8-9/10 in L anterior chest from "gas bubble" but reported that the pain was not radiating to his L shoulder/scapula like it was earlier. Repositioning, rest breaks, and distraction done to reduce pain levels. Session with emphasis on functional mobility/transfers, generalized strengthening, dynamic standing balance/coordination, gait training, and improved activity tolerance. Pt performed bed mobility with supervision and donned shoes sitting EOB mod I. Pt performed all transfers with [facility] and supervision and ambulated 167ft with SPC and supervision to [facility] elevators with 1 L lateral LOB requiring CGA to correct. Pt then performed WC mobility 11ft using BLE with emphasis on hamstring strengthening and 64ft x 1 and 68ft x 1 using BUE and supervision with emphasis on BUE strengthening (no increase in rib pain). Pt transported remainder of way back to room in [facility] total A and requested to return to bed. Ambulated 71ft with SPC and supervision to bed and stood to void in urinal with supervision. Sit<>semi-reclined with supervision and positioned pillows and k-pad around L chest for comfort and positioned pt to expand lungs. Concluded session with pt semi-reclined in bed with all needs within reach.   Therapy Documentation Precautions:  Precautions Precautions: Fall Precaution Comments: Monitor HR Restrictions Weight Bearing Restrictions: No  Therapy/Group: Individual Therapy Jayquan Bradsher PT, DPT   12/18/2020, 7:18 AM

## 2020-12-18 NOTE — Progress Notes (Signed)
Occupational Therapy Session Note  Patient Details  Name: Ernest Rodgers MRN: [id number] Date of Birth: 1970-07-04  Today's Date: 12/18/2020 OT Individual Time: 6295-2841 OT Individual Time Calculation (min): 14 min  and Today's Date: 12/18/2020 OT Missed Time: 46 Minutes Missed Time Reason: Pain (awaiting stat CT/x-ray)   Short Term Goals: Week 1:  OT Short Term Goal 1 (Week 1): STG = LTGs due to ELOS  Skilled Therapeutic Interventions/Progress Updates:    Treatment session limited by pain along L rib cage radiating to scapula/shoulder and awaiting x-ray and possible CT.  Pt reports unable to obtain a comfortable position and not feeling up to any activity while awaiting transport. Pt remained semi-reclined in bed with all needs in reach.  Therapist will attempt to return later in day as pt and therapist schedule allows.  Therapy Documentation Precautions:  Precautions Precautions: Fall Precaution Comments: Monitor HR Restrictions Weight Bearing Restrictions: No General: General OT Amount of Missed Time: 46 Minutes  Pain: Pain Assessment Pain Scale: 0-10 Pain Score: 8  Pain Type: Acute pain Pain Location: Rib cage Pain Orientation: Left Pain Descriptors / Indicators: Sharp Pain Frequency: Constant Pain Onset: On-going Patients Stated Pain Goal: 5 Pain Intervention(s): Medication (See eMAR) (flexiril, oxycodone given)   Therapy/Group: Individual Therapy  [name] 12/18/2020, 10:20 AM

## 2020-12-18 NOTE — Progress Notes (Signed)
Inpatient Rehabilitation Center Individual Statement of Services  Patient Name:  Ernest Rodgers  Date:  12/18/2020  Welcome to the Inpatient Rehabilitation Center.  Our goal is to provide you with an individualized program based on your diagnosis and situation, designed to meet your specific needs.  With this comprehensive rehabilitation program, you will be expected to participate in at least 3 hours of rehabilitation therapies Monday-Friday, with modified therapy programming on the weekends.  Your rehabilitation program will include the following services:  Physical Therapy (PT), Occupational Therapy (OT), Speech Therapy (ST), 24 hour per day rehabilitation nursing, Therapeutic Recreaction (TR), Neuropsychology, Care Coordinator, Rehabilitation Medicine, Nutrition Services, Pharmacy Services, and Other  Weekly team conferences will be held on Tuesdays to discuss your progress.  Your Inpatient Rehabilitation Care Coordinator will talk with you frequently to get your input and to update you on team discussions.  Team conferences with you and your family in attendance may also be held.  Expected length of stay: 10-12 Days  Overall anticipated outcome:  MOD I to Supervision  Depending on your progress and recovery, your program may change. Your Inpatient Rehabilitation Care Coordinator will coordinate services and will keep you informed of any changes. Your Inpatient Rehabilitation Care Coordinator's name and contact numbers are listed  below.  The following services may also be recommended but are not provided by the Inpatient Rehabilitation Center:   Home Health Rehabiltiation Services Outpatient Rehabilitation Services    Arrangements will be made to provide these services after discharge if needed.  Arrangements include referral to agencies that provide these services.  Your insurance has been verified to be:  [employer]  Your primary doctor is:  NO PCP  Pertinent information will be  shared with your doctor and your insurance company.  Inpatient Rehabilitation Care Coordinator:  Lavera Guise, [place] [phone] or [phone]  Information discussed with and copy given to patient by: Andria Rhein, 12/18/2020, 9:15 AM

## 2020-12-18 NOTE — Progress Notes (Signed)
PROGRESS NOTE   Subjective/Complaints: Complaints of pain radiating from his left ribs to his scapula- feels it is gas pain but he has tried simethicone regularly without benefits. Discussed other possible diagnoses and getting a CXR to assess for pulmonary pathology  ROS: Pt denies SOB, CP, N/V/C/D, and vision changes, +left sided abdominal pain radiating into left shoulder   Objective:   No results found. Recent Labs    12/17/20 0559  WBC 6.4  HGB 9.7*  HCT 30.2*  PLT 371   Recent Labs    12/17/20 0559  NA 134*  K 4.1  CL 100  CO2 23  GLUCOSE 113*  BUN 11  CREATININE 0.56*  CALCIUM 9.2    Intake/Output Summary (Last 24 hours) at 12/18/2020 1007 Last data filed at 12/18/2020 0748 Gross per 24 hour  Intake 476 ml  Output 1100 ml  Net -624 ml        Physical Exam: Vital Signs Blood pressure 128/75, pulse 86, temperature 98.5 F (36.9 C), resp. rate 18, height 5\' 10"  (1.778 m), weight 112.4 kg, SpO2 93 %. Gen: no distress, normal appearing HEENT: oral mucosa pink and moist, NCAT Cardio: Reg rate Chest: normal effort, normal rate of breathing Abd: soft, non-distended Ext: no edema Psych: pleasant, normal affect Skin: intact  Musculoskeletal:     Cervical back: Normal range of motion. No rigidity.     Comments: UE- biceps 4+/5, triceps 4+/5, WE 5-/5, grip 5-/5, and FA 5-/5 B/L LE- HF 4/5, KE 4/5, KF 4/5, DF and PF 4+/5 B/L   Skin:    Comments: B/L forearm IV's- L doesn't work per pt- R OK- both look well Head shaved No skin breakdown on backside or feet  Neurological:     Mental Status: He is alert and oriented to person, place, and time.     Comments: Verbose with rapid/slurred speech at times. Needs occasional redirection but able to follow commands without difficulty.  Intact to light touch in all 4 extremities    Assessment/Plan: 1. Functional deficits which require 3+ hours per day of  interdisciplinary therapy in a comprehensive inpatient rehab setting. Physiatrist is providing close team supervision and 24 hour management of active medical problems listed below. Physiatrist and rehab team continue to assess barriers to discharge/monitor patient progress toward functional and medical goals  Care Tool:  Bathing    Body parts bathed by patient: Right arm, Left arm, Chest, Abdomen, Front perineal area, Buttocks, Right upper leg, Left upper leg, Face         Bathing assist Assist Level: Contact Guard/Touching assist     Upper Body Dressing/Undressing Upper body dressing   What is the patient wearing?: Pull over shirt    Upper body assist Assist Level: Minimal Assistance - Patient > 75%    Lower Body Dressing/Undressing Lower body dressing      What is the patient wearing?: Underwear/pull up     Lower body assist Assist for lower body dressing: Minimal Assistance - Patient > 75%     Toileting Toileting    Toileting assist Assist for toileting: Independent with assistive device Assistive Device Comment: urinal   Transfers Chair/bed transfer  Transfers assist     Chair/bed transfer assist level: Supervision/Verbal cueing     Locomotion Ambulation   Ambulation assist      Assist level: Contact Guard/Touching assist Assistive device: Cane-straight Max distance: 148ft   Walk 10 feet activity   Assist     Assist level: Contact Guard/Touching assist Assistive device: Cane-straight   Walk 50 feet activity   Assist    Assist level: Contact Guard/Touching assist Assistive device: Cane-straight    Walk 150 feet activity   Assist Walk 150 feet activity did not occur: Safety/medical concerns (fatigue, generalized weakness/deconditioning, decreased balance/postural control)  Assist level: Contact Guard/Touching assist Assistive device: Cane-straight    Walk 10 feet on uneven surface  activity   Assist Walk 10 feet on uneven  surfaces activity did not occur: Safety/medical concerns (fatigue, generalized weakness/deconditioning, decreased balance/postural control)         Wheelchair     Assist Is the patient using a wheelchair?: Yes Type of Wheelchair: Manual Wheelchair activity did not occur: Safety/medical concerns  Wheelchair assist level: Dependent - Patient 0% Max wheelchair distance: 122ft    Wheelchair 50 feet with 2 turns activity    Assist        Assist Level: Dependent - Patient 0%   Wheelchair 150 feet activity     Assist      Assist Level: Dependent - Patient 0%   Blood pressure 128/75, pulse 86, temperature 98.5 F (36.9 C), resp. rate 18, height 5\' 10"  (1.778 m), weight 112.4 kg, SpO2 93 %.  Medical Problem List and Plan: 1.  Debility secondary to [facility] hospital stay from pancreatitis/intubation, splenic hematoma s/p embolization; and flash pulm edema             -patient may  shower             -ELOS/Goals: 10-14 days- supervision  Continue CIR- PT, OT 2.  Impaired mobility: SCDs ordered.  3. Pain: Tylenol 650 mg qid. Increase oxycodone to 10mg  q4H prn.   9/5- will give gas x 160 mg QID and suggested walking with therapy to get gas bubble out.   9/6: Check CXR to assess for pulmonary pathology given radiation into scapula 4. Mood: LCSW to follow for evalaution and support.              -antipsychotic agents: N/A 5. Neuropsych: This patient may be intermittently capable of making decisions on his own behalf. 6. Skin/Wound Care: Routine pressure relief measures.  7. Fluids/Electrolytes/Nutrition: Monitor I/O. Check CMET in am.  8. DOE: Encourage pulmonary toilet.  Question fluid overload--wt up by average of 15 lbs?              --Monitor for signs of overload and check daily standing weights   9. SBO/Ileus: Has resolved. Augment bowel program as indicated             --Keep K>4.0 and Mg>2.0 to prevent recurrence             --add Kdur for supplement as on lasix  and K<4.0  -discussed with patient that K+ and Magnesium are improving, decrease dose of K+ supplement.  10. HTN/Tachycardia: Monitor HR TID--on Toprol XL, Lisinopril and Furosemide.   9/6- BP controlled and HR 80s- continue regimen 11. Abnormal LFTs:Resolving--recheck in am  9/5- Last LFTs 9/2- looked resolved 12. Acute blood loss anemia: Continue to monitor--especially with lovenox on board             --stable in  7-8 range.   9/5- Hb 9.7- improving- con't regimen 13. ETOH abuse/Hepatic steatosis: ContinueThiamine and Folic acid.  14. Nose bleed: Monitor for recurrence.  15. Low protein: start Juven 16. Hyperglycemia: d/c Ensure    LOS: 5 days A FACE TO FACE EVALUATION WAS PERFORMED  Arlyn Buerkle P Brenyn Petrey 12/18/2020, 10:07 AM

## 2020-12-19 MED ORDER — BACLOFEN 10 MG PO TABS
5.0000 mg | ORAL_TABLET | Freq: Once | ORAL | Status: AC
  Administered 2020-12-19: 5 mg via ORAL
  Filled 2020-12-19: qty 1

## 2020-12-19 MED ORDER — MAGNESIUM GLUCONATE 500 MG PO TABS
250.0000 mg | ORAL_TABLET | Freq: Every day | ORAL | Status: DC
  Administered 2020-12-19 – 2020-12-20 (×2): 250 mg via ORAL
  Filled 2020-12-19 (×3): qty 1

## 2020-12-19 NOTE — Progress Notes (Signed)
Occupational Therapy Session Note  Patient Details  Name: Ernest Rodgers MRN: [id number] Date of Birth: [birth year]  Today's Date: 12/19/2020 OT Individual Time: 1130-1200 and 1348-1500 OT Individual Time Calculation (min): 30 min and 72 min   Short Term Goals: Week 1:  OT Short Term Goal 1 (Week 1): STG = LTGs due to ELOS  Skilled Therapeutic Interventions/Progress Updates:    1) Treatment session with focus on functional mobility and activity tolerance.  Pt received upright at EOB agreeable to therapy session. Pt requesting to engage in session on 2W where his wife works as an Charity fundraiser; transported in w/c dependently for time management purposes to uplift mood/spirits; pt very Adult nurse.  Pt ambulated 100' with SPC CGA to Supervision with cues for breathing technique as pt reports still unable to take a full breath due to pain.  Educated on activity tolerance/endurance and energy conservation strategies with pt and wife.  Pt returned to room and ambulated 15' back to bed with [facility] Supervision.  Pt remained seated EOB with all needs in reach.  2) Treatment session with focus on self-care retraining and functional mobility.  Pt received seated EOB agreeable to therapy session.  Pt expressing desire to go outside during first portion of session prior to shower.  Pt ambulated 115' up incline at entrance to hospital with [facility] with Supervision.  Therapist educated on energy conservation strategies and awareness of community reintegration in regards to busy times and heat of the day.  Pt returned to room via w/c for energy conservation and required ~5 min rest break prior to shower.  Pt ambulated around room with [facility] Mod I to gather items prior to shower.  Pt completed shower at sit > stand level with supervision/setup initially and then Mod I for bathing.  Pt completed dressing at sit > stand level from shower seat.  Pt ambulated back to EOB with SPC and returned to sitting EOB.  Pt reports fatigue but feeling more  energy than previous sessions.  Discussed recommendation for shower seat/stool for shower for endurance and energy conservation. Pt reports that his wife has looked but hasn't found appropriate one yet.  Therapist recommends locations in the area to look.  Pt remained seated EOB with all needs in reach.  Therapy Documentation Precautions:  Precautions Precautions: Fall Precaution Comments: Monitor HR Restrictions Weight Bearing Restrictions: No  Pain: Pain Assessment Pain Score: 7  L rib cage, premedicated.   Therapy/Group: Individual Therapy  Rosalio Loud 12/19/2020, 12:25 PM

## 2020-12-19 NOTE — Progress Notes (Signed)
Physical Therapy Session Note  Patient Details  Name: Ernest Rodgers MRN: [id number] Date of Birth: 10-24-1970  Today's Date: 12/19/2020 PT Individual Time: 1030-1055 PT Individual Time Calculation (min): 25 min   Short Term Goals: Week 1:  PT Short Term Goal 1 (Week 1): STG=LTG due to LOS  Skilled Therapeutic Interventions/Progress Updates:    Pt received in recliner and agreeable to therapy. Pt c/o of pain in ribs which limits his ability to take a deep breath, therapy to tolerance.  Session focused on endurance, including the following: Gait x 140 ft with supervision and SPC W/c propulsion with BLE x 160 ft (pushing chair backward) W/c propulsion x 160 ft with BUE  Pt returned to room and requested to sit EOB. Pt was left with all needs in reach.   Therapy Documentation Precautions:  Precautions Precautions: Fall Precaution Comments: Monitor HR Restrictions Weight Bearing Restrictions: No    Therapy/Group: Individual Therapy  Juluis Rainier 12/19/2020, 10:59 AM

## 2020-12-19 NOTE — Progress Notes (Signed)
Occupational Therapy Discharge Summary  Patient Details  Name: Ernest Rodgers MRN: [id number] Date of Birth: [birth year]   Patient has met 38 of 11 long term goals due to improved activity tolerance, improved balance, postural control, ability to compensate for deficits, and functional use of  LEFT upper extremity.  Patient to discharge at overall Modified Independent level, supervision/setup for shower transfers.  Patient's care partner is independent to provide the necessary  intermittent  assistance at discharge, as she works as Therapist, sports 3 days/week.  Patient's wife has been present for portions of therapy sessions and understands recommendations from therapy team for improved safety and independence.  Reasons goals not met: N/A  Recommendation:  Patient will not require OT services at this time.  PT recommending outpatient for dynamic balance and endurance.  Equipment: No equipment provided  Reasons for discharge: treatment goals met and discharge from hospital  Patient/family agrees with progress made and goals achieved: Yes  OT Discharge Precautions/Restrictions  Precautions Precautions: Fall Restrictions Weight Bearing Restrictions: No Pain Pain Assessment Pain Scale: 0-10 Pain Score: 6  Pain Type: Acute pain Pain Location: Rib cage Pain Orientation: Left Pain Descriptors / Indicators: Sharp Pain Onset: With Activity Pain Intervention(s): Shower;Repositioned ADL ADL Eating: Independent Grooming: Modified independent Where Assessed-Grooming: Standing at sink Upper Body Bathing: Modified independent Where Assessed-Upper Body Bathing: Shower Lower Body Bathing: Modified independent Where Assessed-Lower Body Bathing: Shower Upper Body Dressing: Modified independent (Device) Where Assessed-Upper Body Dressing: Edge of bed Lower Body Dressing: Modified independent Where Assessed-Lower Body Dressing: Edge of bed Toileting: Modified independent Where Assessed-Toileting: Bedside  Commode Toilet Transfer: Modified independent Toilet Transfer Method: Counselling psychologist: Geophysical data processor: Modified independent Social research officer, government Method: Heritage manager: Grab bars, Shower seat with back Vision Baseline Vision/History: 1 Wears glasses Patient Visual Report: No change from baseline Vision Assessment?: No apparent visual deficits Perception  Perception: Within Functional Limits Praxis Praxis: Intact Cognition Overall Cognitive Status: Within Functional Limits for tasks assessed Arousal/Alertness: Awake/alert Orientation Level: Oriented X4 Year: 2022 Month: September Day of Week: Correct Attention: Selective Selective Attention: Appears intact Memory: Appears intact Immediate Memory Recall: Sock;Blue;Bed Memory Recall Sock: Without Cue Memory Recall Blue: Without Cue Memory Recall Bed: Without Cue Awareness: Appears intact Problem Solving: Appears intact Safety/Judgment: Appears intact Sensation Sensation Light Touch: Appears Intact Proprioception: Appears Intact Additional Comments: hypersensitivity above bilateral thighs and along abdomen imporved significantly Coordination Gross Motor Movements are Fluid and Coordinated: Yes Fine Motor Movements are Fluid and Coordinated: Yes Coordination and Movement Description: generalized weakness/deconditioning and pain Finger Nose Finger Test: [facility] bilaterally Heel Shin Test: [facility] bilaterally Motor  Motor Motor: Within Functional Limits Motor - Skilled Clinical Observations: generalized weakness/deconditioning and pain Mobility  Bed Mobility Bed Mobility: Rolling Right;Rolling Left;Sit to Supine;Supine to Sit Rolling Right: Independent with assistive device Rolling Left: Independent with assistive device Supine to Sit: Independent with assistive device Sit to Supine: Independent with assistive device Transfers Sit to Stand: Independent with  assistive device Stand to Sit: Independent with assistive device  Trunk/Postural Assessment  Cervical Assessment Cervical Assessment: Within Functional Limits Thoracic Assessment Thoracic Assessment: Within Functional Limits Lumbar Assessment Lumbar Assessment: Within Functional Limits Postural Control Postural Control: Within Functional Limits  Balance Balance Balance Assessed: Yes Static Sitting Balance Static Sitting - Balance Support: Feet supported;No upper extremity supported Static Sitting - Level of Assistance: 7: Independent Dynamic Sitting Balance Dynamic Sitting - Balance Support: Feet supported;No upper extremity supported Dynamic Sitting - Level of Assistance: 7:  Independent Static Standing Balance Static Standing - Balance Support: Right upper extremity supported (SPC) Static Standing - Level of Assistance: 6: Modified independent (Device/Increase time) Dynamic Standing Balance Dynamic Standing - Balance Support: Right upper extremity supported (SPC) Dynamic Standing - Level of Assistance: 6: Modified independent (Device/Increase time) Extremity/Trunk Assessment RUE Assessment RUE Assessment: Within Functional Limits General Strength Comments: grossly 4+/5 LUE Assessment LUE Assessment: Exceptions to [facility] Active Range of Motion (AROM) Comments: limited due to rib pain but able to achieve shoulder flexion WFL with increased time General Strength Comments: grossly 4/5   Eli Adami, [place] 12/19/2020, 2:45 PM

## 2020-12-19 NOTE — Progress Notes (Signed)
PROGRESS NOTE   Subjective/Complaints: Continues to have the left sided abdominal pain radiating to his scapula- feels it started suddenly when moving during first shower. Discussed trying Baclofen and he is agreeable Slept better last night, but still disrupted.   ROS: Pt denies SOB, CP, N/V/C/D, and vision changes, +left sided abdominal pain radiating into left shoulder, +frequent awakenings at night   Objective:   DG Chest 2 View  Result Date: 12/18/2020 CLINICAL DATA:  Left-sided shoulder and rib pain EXAM: CHEST - 2 VIEW COMPARISON:  12/11/2020 chest radiograph FINDINGS: Interval removal of right upper extremity PICC line. Low lung volumes. No focal pulmonary opacity. No pleural effusion. No pneumothorax. Unchanged cardiac and mediastinal silhouette. No acute osseous abnormality. No displaced rib fracture is seen. IMPRESSION: No active cardiopulmonary disease. No displaced rib fracture is seen. Electronically Signed   By: Wiliam Ke M.D.   On: 12/18/2020 11:26   Recent Labs    12/17/20 0559  WBC 6.4  HGB 9.7*  HCT 30.2*  PLT 371   Recent Labs    12/17/20 0559  NA 134*  K 4.1  CL 100  CO2 23  GLUCOSE 113*  BUN 11  CREATININE 0.56*  CALCIUM 9.2     Intake/Output Summary (Last 24 hours) at 12/19/2020 0844 Last data filed at 12/19/2020 0841 Gross per 24 hour  Intake 477 ml  Output 400 ml  Net 77 ml        Physical Exam: Vital Signs Blood pressure 109/71, pulse 93, temperature 98.2 F (36.8 C), resp. rate 18, height 5\' 10"  (1.778 m), weight 112.4 kg, SpO2 91 %. Gen: no distress, normal appearing HEENT: oral mucosa pink and moist, NCAT Cardio: Reg rate Chest: normal effort, normal rate of breathing Abd: soft, non-distended Ext: no edema Psych: pleasant, normal affect  Musculoskeletal:     Cervical back: Normal range of motion. No rigidity.     Comments: UE- biceps 4+/5, triceps 4+/5, WE 5-/5, grip 5-/5,  and FA 5-/5 B/L LE- HF 4/5, KE 4/5, KF 4/5, DF and PF 4+/5 B/L   Skin:    Comments: B/L forearm IV's- L doesn't work per pt- R OK- both look well Head shaved No skin breakdown on backside or feet  Neurological:     Mental Status: He is alert and oriented to person, place, and time.     Comments: Verbose with rapid/slurred speech at times. Needs occasional redirection but able to follow commands without difficulty.  Intact to light touch in all 4 extremities    Assessment/Plan: 1. Functional deficits which require 3+ hours per day of interdisciplinary therapy in a comprehensive inpatient rehab setting. Physiatrist is providing close team supervision and 24 hour management of active medical problems listed below. Physiatrist and rehab team continue to assess barriers to discharge/monitor patient progress toward functional and medical goals  Care Tool:  Bathing    Body parts bathed by patient: Right arm, Left arm, Chest, Abdomen, Front perineal area, Buttocks, Right upper leg, Left upper leg, Face         Bathing assist Assist Level: Contact Guard/Touching assist     Upper Body Dressing/Undressing Upper body dressing   What  is the patient wearing?: Pull over shirt    Upper body assist Assist Level: Minimal Assistance - Patient > 75%    Lower Body Dressing/Undressing Lower body dressing      What is the patient wearing?: Underwear/pull up     Lower body assist Assist for lower body dressing: Minimal Assistance - Patient > 75%     Toileting Toileting    Toileting assist Assist for toileting: Independent with assistive device Assistive Device Comment: urinal   Transfers Chair/bed transfer  Transfers assist     Chair/bed transfer assist level: Supervision/Verbal cueing     Locomotion Ambulation   Ambulation assist      Assist level: Contact Guard/Touching assist Assistive device: Cane-straight Max distance: 110ft   Walk 10 feet activity   Assist      Assist level: Contact Guard/Touching assist Assistive device: Cane-straight   Walk 50 feet activity   Assist    Assist level: Contact Guard/Touching assist Assistive device: Cane-straight    Walk 150 feet activity   Assist Walk 150 feet activity did not occur: Safety/medical concerns (fatigue, generalized weakness/deconditioning, decreased balance/postural control)  Assist level: Contact Guard/Touching assist Assistive device: Cane-straight    Walk 10 feet on uneven surface  activity   Assist Walk 10 feet on uneven surfaces activity did not occur: Safety/medical concerns (fatigue, generalized weakness/deconditioning, decreased balance/postural control)         Wheelchair     Assist Is the patient using a wheelchair?: Yes Type of Wheelchair: Manual    Wheelchair assist level: Dependent - Patient 0% Max wheelchair distance: 19ft    Wheelchair 50 feet with 2 turns activity    Assist        Assist Level: Dependent - Patient 0%   Wheelchair 150 feet activity     Assist      Assist Level: Dependent - Patient 0%   Blood pressure 109/71, pulse 93, temperature 98.2 F (36.8 C), resp. rate 18, height 5\' 10"  (1.778 m), weight 112.4 kg, SpO2 91 %.  Medical Problem List and Plan: 1.  Debility secondary to [facility] hospital stay from pancreatitis/intubation, splenic hematoma s/p embolization; and flash pulm edema             -patient may  shower             -ELOS/Goals: 10-14 days- supervision  Continue CIR- PT, OT 2.  Impaired mobility: SCDs ordered.  3. Pain: Tylenol 650 mg qid. Increase oxycodone to 10mg  q4H prn.   9/5- will give gas x 160 mg QID and suggested walking with therapy to get gas bubble out.   CXR reviewed with patient and stable. Trial Baclofen 5mg  once.  4. Mood: LCSW to follow for evalaution and support.              -antipsychotic agents: N/A 5. Neuropsych: This patient may be intermittently capable of making decisions on his  own behalf. 6. Skin/Wound Care: Routine pressure relief measures.  7. Fluids/Electrolytes/Nutrition: Monitor I/O. Check CMET in am.  8. DOE: Encourage pulmonary toilet.  Question fluid overload--wt up by average of 15 lbs?              --Monitor for signs of overload and check daily standing weights   9. SBO/Ileus: Has resolved. Augment bowel program as indicated             --Keep K>4.0 and Mg>2.0 to prevent recurrence             --add  Kdur for supplement as on lasix and K<4.0  -discussed with patient that K+ and Magnesium are improving, decrease dose of K+ supplement.  10. HTN/Tachycardia: Monitor HR TID--on Toprol XL, Lisinopril and Furosemide.   9/6- BP controlled and HR 80s- continue regimen 11. Abnormal LFTs:Resolving--recheck in am  9/5- Last LFTs 9/2- looked resolved 12. Acute blood loss anemia: Continue to monitor--especially with lovenox on board             --stable in 7-8 range.   9/5- Hb 9.7- improving- con't regimen 13. ETOH abuse/Hepatic steatosis: ContinueThiamine and Folic acid. Liver enzymes reviewed and stable.  14. Nose bleed: Monitor for recurrence.  15. Low protein: start Juven 16. Hyperglycemia: d/c Ensure 17. Severe obesity BMI 35.56: provide dietary counseling    LOS: 6 days A FACE TO FACE EVALUATION WAS PERFORMED  Ernest Rodgers 12/19/2020, 8:44 AM

## 2020-12-19 NOTE — Progress Notes (Signed)
Physical Therapy Session Note  Patient Details  Name: Ernest Rodgers MRN: [id number] Date of Birth: February 28, 1971  Today's Date: 12/19/2020 PT Individual Time: 0800-0910 PT Individual Time Calculation (min): 70 min   Short Term Goals: Week 1:  PT Short Term Goal 1 (Week 1): STG=LTG due to LOS  Skilled Therapeutic Interventions/Progress Updates:   Received pt semi-reclined in bed with k-pad over L anterior ribs. Pt agreeable to PT treatment and reported continued pain in L anterior ribs (unrated), but was able to sleep some last night. Repositioning, rest breaks, and distraction done to reduce pain levels during session. Session with emphasis on functional mobility/transfers, generalized strengthening, dynamic standing balance/coordination, stair navigation, simulated car transfers, and improved activity tolerance. Pt performed bed mobility with HOB elevated and use of bedrails with supervision and ambulated 42ft with SPC and supervision to [facility] and transported to/from room on 5C in WC total A for time management and energy conservation purposes. Pt performed ambulatory simulated car transfer from truck height with 6in running board with supervision to simulate pt's car - min cues to avoid pulling on mobile doorframe. Pt then ambulated 8ft with SPC and supervision on uneven surfaces (ramp and mulch) and descended 1 6in curb with SPC and supervision. MD present for morning rounds then pt navigated 12 steps with L handrail and supervision using a lateral stepping technique ascending and descending with a step to pattern. Pt required 1 standing rest break after first 4 steps and extensive seated rest beak afterwards reporting "that took it out of him". Pt transported to dayroom and requested to be weighed. Stepped onto scale with supervision and weight 227.3lbs -pt very excited about losing weight; RN notified. Pt ambulated 6ft without AD and supervision to recliner. Placed WC cushion on recliner seat for comfort.  Concluded session with pt sitting in recliner with all needs within reach.   Therapy Documentation Precautions:  Precautions Precautions: Fall Precaution Comments: Monitor HR Restrictions Weight Bearing Restrictions: No   Therapy/Group: Individual Therapy [place] Delauder PT, DPT   12/19/2020, 7:29 AM

## 2020-12-20 MED ORDER — BACLOFEN 10 MG PO TABS
5.0000 mg | ORAL_TABLET | Freq: Three times a day (TID) | ORAL | Status: DC | PRN
  Administered 2020-12-20 – 2020-12-21 (×2): 5 mg via ORAL
  Filled 2020-12-20 (×2): qty 1

## 2020-12-20 MED ORDER — BACLOFEN 10 MG PO TABS
5.0000 mg | ORAL_TABLET | Freq: Two times a day (BID) | ORAL | Status: DC | PRN
  Administered 2020-12-20: 5 mg via ORAL
  Filled 2020-12-20: qty 1

## 2020-12-20 NOTE — Progress Notes (Signed)
PROGRESS NOTE   Subjective/Complaints: Baclofen did help with abdominal pain- scheduled for BID PRN  ROS: Pt denies SOB, CP, N/V/C/D, and vision changes, +left sided abdominal pain radiating into left shoulder, +frequent awakenings at night   Objective:   DG Chest 2 View  Result Date: 12/18/2020 CLINICAL DATA:  Left-sided shoulder and rib pain EXAM: CHEST - 2 VIEW COMPARISON:  12/11/2020 chest radiograph FINDINGS: Interval removal of right upper extremity PICC line. Low lung volumes. No focal pulmonary opacity. No pleural effusion. No pneumothorax. Unchanged cardiac and mediastinal silhouette. No acute osseous abnormality. No displaced rib fracture is seen. IMPRESSION: No active cardiopulmonary disease. No displaced rib fracture is seen. Electronically Signed   By: Wiliam Ke M.D.   On: 12/18/2020 11:26   No results for input(s): WBC, HGB, HCT, PLT in the last 72 hours.  No results for input(s): NA, K, CL, CO2, GLUCOSE, BUN, CREATININE, CALCIUM in the last 72 hours.    Intake/Output Summary (Last 24 hours) at 12/20/2020 0846 Last data filed at 12/20/2020 0600 Gross per 24 hour  Intake 840 ml  Output 1550 ml  Net -710 ml        Physical Exam: Vital Signs Blood pressure 113/70, pulse 83, temperature 97.9 F (36.6 C), temperature source Oral, resp. rate 18, height 5\' 10"  (1.778 m), weight 112.4 kg, SpO2 94 %. Gen: no distress, normal appearing HEENT: oral mucosa pink and moist, NCAT Cardio: Reg rate Chest: normal effort, normal rate of breathing Abd: soft, non-distended Ext: no edema Psych: pleasant, normal affect Musculoskeletal:     Cervical back: Normal range of motion. No rigidity.     Comments: UE- biceps 4+/5, triceps 4+/5, WE 5-/5, grip 5-/5, and FA 5-/5 B/L LE- HF 4/5, KE 4/5, KF 4/5, DF and PF 4+/5 B/L   Skin:    Comments: B/L forearm IV's- L doesn't work per pt- R OK- both look well Head shaved No skin  breakdown on backside or feet  Neurological:     Mental Status: He is alert and oriented to person, place, and time.     Comments: Verbose with rapid/slurred speech at times. Needs occasional redirection but able to follow commands without difficulty.  Intact to light touch in all 4 extremities    Assessment/Plan: 1. Functional deficits which require 3+ hours per day of interdisciplinary therapy in a comprehensive inpatient rehab setting. Physiatrist is providing close team supervision and 24 hour management of active medical problems listed below. Physiatrist and rehab team continue to assess barriers to discharge/monitor patient progress toward functional and medical goals  Care Tool:  Bathing    Body parts bathed by patient: Right arm, Left arm, Chest, Abdomen, Front perineal area, Buttocks, Right upper leg, Left upper leg, Face, Right lower leg, Left lower leg         Bathing assist Assist Level: Set up assist     Upper Body Dressing/Undressing Upper body dressing   What is the patient wearing?: Pull over shirt    Upper body assist Assist Level: Independent with assistive device    Lower Body Dressing/Undressing Lower body dressing      What is the patient wearing?: Underwear/pull up,  Pants     Lower body assist Assist for lower body dressing: Independent with assitive device     Toileting Toileting    Toileting assist Assist for toileting: Independent with assistive device Assistive Device Comment: urinal   Transfers Chair/bed transfer  Transfers assist     Chair/bed transfer assist level: Independent with assistive device     Locomotion Ambulation   Ambulation assist      Assist level: Contact Guard/Touching assist Assistive device: Cane-straight Max distance: 126ft   Walk 10 feet activity   Assist     Assist level: Contact Guard/Touching assist Assistive device: Cane-straight   Walk 50 feet activity   Assist    Assist level:  Contact Guard/Touching assist Assistive device: Cane-straight    Walk 150 feet activity   Assist Walk 150 feet activity did not occur: Safety/medical concerns (fatigue, generalized weakness/deconditioning, decreased balance/postural control)  Assist level: Contact Guard/Touching assist Assistive device: Cane-straight    Walk 10 feet on uneven surface  activity   Assist Walk 10 feet on uneven surfaces activity did not occur: Safety/medical concerns (fatigue, generalized weakness/deconditioning, decreased balance/postural control)         Wheelchair     Assist Is the patient using a wheelchair?: Yes Type of Wheelchair: Manual    Wheelchair assist level: Dependent - Patient 0% Max wheelchair distance: 171ft    Wheelchair 50 feet with 2 turns activity    Assist        Assist Level: Dependent - Patient 0%   Wheelchair 150 feet activity     Assist      Assist Level: Dependent - Patient 0%   Blood pressure 113/70, pulse 83, temperature 97.9 F (36.6 C), temperature source Oral, resp. rate 18, height 5\' 10"  (1.778 m), weight 112.4 kg, SpO2 94 %.  Medical Problem List and Plan: 1.  Debility secondary to [facility] hospital stay from pancreatitis/intubation, splenic hematoma s/p embolization; and flash pulm edema             -patient may  shower             -ELOS/Goals: 10-14 days- supervision  Continue CIR- PT, OT 2.  Impaired mobility: SCDs ordered.  3. Pain: Tylenol 650 mg qid. Continue oxycodone to 10mg  q4H prn. Add Baclofen 5mg  BID PRN.   9/5- will give gas x 160 mg QID and suggested walking with therapy to get gas bubble out.   CXR reviewed with patient and stable.  4. Mood: LCSW to follow for evalaution and support.              -antipsychotic agents: N/A 5. Neuropsych: This patient may be intermittently capable of making decisions on his own behalf. 6. Skin/Wound Care: Routine pressure relief measures.  7. Fluids/Electrolytes/Nutrition: Monitor  I/O. Check CMET in am.  8. DOE: Encourage pulmonary toilet.  Question fluid overload--wt up by average of 15 lbs?              --Monitor for signs of overload and check daily standing weights   9. SBO/Ileus: Has resolved. Augment bowel program as indicated             --Keep K>4.0 and Mg>2.0 to prevent recurrence             --add Kdur for supplement as on lasix and K<4.0  -discussed with patient that K+ and Magnesium are improving, decrease dose of K+ supplement. Repeat K+ tomorrow. 10. HTN/Tachycardia: Monitor HR TID--on Toprol XL, Lisinopril and Furosemide.  9/6- BP controlled and HR 80s- continue regimen  9/8: check magnesium tomorrow 11. Abnormal LFTs:Resolving--recheck in am  9/5- Last LFTs 9/2- looked resolved 12. Acute blood loss anemia: Continue to monitor--especially with lovenox on board             --stable in 7-8 range.   9/5- Hb 9.7- improving- con't regimen 13. ETOH abuse/Hepatic steatosis: ContinueThiamine and Folic acid. Liver enzymes reviewed and stable.  14. Nose bleed: Monitor for recurrence.  15. Low protein: start Juven 16. Hyperglycemia: d/c Ensure 17. Severe obesity BMI 35.56: provide dietary counseling    LOS: 7 days A FACE TO FACE EVALUATION WAS PERFORMED  Ernest Rodgers 12/20/2020, 8:46 AM

## 2020-12-20 NOTE — Progress Notes (Signed)
Physical Therapy Discharge Summary  Patient Details  Name: Ernest Rodgers MRN: [id number] Date of Birth: [birth year]  Today's Date: 12/20/2020 PT Individual Time: 1100-1157 PT Individual Time Calculation (min): 57 min   Patient has met 8 of 8 long term goals due to improved activity tolerance, improved balance, improved postural control, increased strength, and improved coordination. Patient to discharge at an ambulatory level Modified Independent. Patient's care partner is independent to provide the necessary physical assistance at discharge. Pt's wife did not attend family education training, however pt has verbalized and demonstrated confidence with all tasks to ensure safe discharge home and demonstrates good safety awareness and understanding of current deficits.   All goals met  Recommendation:  Patient will benefit from ongoing skilled PT services in outpatient setting to continue to advance safe functional mobility, address ongoing impairments in generalized strengthening, dynamic standing balance/coordination, gait training, endurance, and to minimize fall risk.  Equipment: [facility]  Reasons for discharge: treatment goals met and discharge from hospital  Patient/family agrees with progress made and goals achieved: Yes  Today's Interventions: Received pt ambulating around room gathering supplies to pack up suitcase. Pt demonstrates good safety awareness and balance and made mod I in room; RN aware and safety plan updated. Pt agreeable to PT treatment, and reported pain 8/10 in L lateral ribcage (premedicated). Repositioning, rest breaks, and distraction done to reduce pain levels. Session with emphasis on discharge planning, functional mobility/transfers, generalized strengthening, dynamic standing balance/coordination, gait training, and improved endurance with activity. Sit<>stands with SPC mod I throughout session. Pt able to stand and pick up deodorant from floor without AD and supervision  with no LOB. Pt transported to 5W in [facility] total A for energy conservation purposes to visit co-workers. Pt then ambulated 83f x 2 with SPC and mod I around [place]. Pt stood and visited with co-workers ~10 minutes occasionally leaning on stable surfaces mod I with no LOB but did require seated rest break afterwards. Discussed ways to modify work duties depending on MD clearance to return to work with ultimate recommendation for desk duties with minimal walking to conserve energy. Pt then performed WC mobility 568fusing BLE and supervision with emphasis on hamstring strengthening with multiple rest breaks due to muscle soreness/fatigue. Ambulated additional 10059fod I with SPC back to room and performed the following activities/exercises: -hamstring stretch with gait belt 3x20 seconds bilaterally -mini squats 1x10 and 1x15 Pt visibly fatigued at end of session but very pleased with progress and appreciative of therapy services. Concluded session with pt sitting EOB with all needs within reach.   PT Discharge Precautions/Restrictions Precautions Precautions: Fall Restrictions Weight Bearing Restrictions: No Pain Interference Pain Interference Pain Effect on Sleep: 2. Occasionally Pain Interference with Therapy Activities: 1. Rarely or not at all Pain Interference with Day-to-Day Activities: 1. Rarely or not at all Cognition Overall Cognitive Status: Within Functional Limits for tasks assessed Arousal/Alertness: Awake/alert Orientation Level: Oriented X4 Memory: Appears intact Awareness: Appears intact Problem Solving: Appears intact Safety/Judgment: Appears intact Sensation Sensation Light Touch: Appears Intact Proprioception: Appears Intact Additional Comments: hypersensitivity above bilateral thighs and along abdomen imporved significantly Coordination Gross Motor Movements are Fluid and Coordinated: Yes Fine Motor Movements are Fluid and Coordinated: Yes Coordination and  Movement Description: generalized weakness/deconditioning and pain Finger Nose Finger Test: WFLThe [facility] Inclaterally Heel Shin Test: WFLClara Maass Medical Centerlaterally Motor  Motor Motor: Within Functional Limits Motor - Skilled Clinical Observations: generalized weakness/deconditioning and pain  Mobility Bed Mobility Bed Mobility: Rolling Right;Rolling Left;Sit to Supine;Supine  to Sit Rolling Right: Independent with assistive device Rolling Left: Independent with assistive device Supine to Sit: Independent with assistive device Sit to Supine: Independent with assistive device Transfers Transfers: Sit to Stand;Stand to Sit;Stand Pivot Transfers Sit to Stand: Independent with assistive device Stand to Sit: Independent with assistive device Stand Pivot Transfers: Independent with assistive device Stand Pivot Transfer Details (indicate cue type and reason): none Transfer (Assistive device): Straight cane Locomotion  Gait Ambulation: Yes Gait Assistance: Independent with assistive device Gait Distance (Feet): 150 Feet Assistive device: Straight cane Gait Assistance Details: none Gait Gait: Yes Gait Pattern: Impaired Gait Pattern: Decreased trunk rotation;Decreased stride length;Decreased step length - right;Decreased step length - left;Poor foot clearance - left;Poor foot clearance - right;Step-through pattern;Antalgic Gait velocity: decreased Stairs / Additional Locomotion Stairs: Yes Stairs Assistance: Supervision/Verbal cueing Stair Management Technique: One rail Left Number of Stairs: 12 Height of Stairs: 6 Ramp: Supervision/Verbal cueing (SPC) Curb: Supervision/Verbal cueing [facility]) Wheelchair Mobility Wheelchair Mobility: Yes Wheelchair Assistance: Chartered loss adjuster: Both lower extermities Wheelchair Parts Management: Supervision/cueing Distance: 44f  Trunk/Postural Assessment  Cervical Assessment Cervical Assessment: Within Functional Limits Thoracic  Assessment Thoracic Assessment: Within Functional Limits Lumbar Assessment Lumbar Assessment: Within Functional Limits Postural Control Postural Control: Within Functional Limits  Balance Balance Balance Assessed: Yes Static Sitting Balance Static Sitting - Balance Support: Feet supported;No upper extremity supported Static Sitting - Level of Assistance: 7: Independent Dynamic Sitting Balance Dynamic Sitting - Balance Support: Feet supported;No upper extremity supported Dynamic Sitting - Level of Assistance: 7: Independent Static Standing Balance Static Standing - Balance Support: Right upper extremity supported (SPC) Static Standing - Level of Assistance: 6: Modified independent (Device/Increase time) Dynamic Standing Balance Dynamic Standing - Balance Support: Right upper extremity supported (SPC) Dynamic Standing - Level of Assistance: 6: Modified independent (Device/Increase time) Dynamic Standing - Comments: with transfers and gait Extremity Assessment  RLE Assessment RLE Assessment: Exceptions to WKaiser Fnd Hosp - FontanaGeneral Strength Comments: grossly generalized to 4+/5 LLE Assessment LLE Assessment: Exceptions to WLifestream Behavioral CenterGeneral Strength Comments: grossly generalized to 4+/5  AAlfonse AlpersPT, DPT  12/20/2020, 7:36 AM

## 2020-12-20 NOTE — Progress Notes (Signed)
Occupational Therapy Note  Patient Details  Name: Giann Obara MRN: [id number] Date of Birth: [birth year]  Today's Date: 12/20/2020 OT Missed Time: 60 Minutes Missed Time Reason: Unavailable (comment);Other (comment) (pt out of room with wife)   Pt missed 60 mins of group session as pt out of room with wife. Will f/u as time allows to make up missed minutes.  Pollyann Glen [facility] 12/20/2020, 3:56 PM

## 2020-12-20 NOTE — Progress Notes (Signed)
Occupational Therapy Session Note  Patient Details  Name: Ernest Rodgers MRN: [id number] Date of Birth: [birth year]  Today's Date: 12/20/2020 OT Individual Time: 9476-5465 OT Individual Time Calculation (min): 56 min    Short Term Goals: Week 1:  OT Short Term Goal 1 (Week 1): STG = LTGs due to ELOS  Skilled Therapeutic Interventions/Progress Updates:  Pt greeted EOB agreeable to OT intervention. Session focus on home mgmt tasks as pt requesting to work on packing up his room. Pt completed in room functional mobility wth no AD and gross supervision for safety. Pt able to reach out of BOS to take down pictures taped on doors of closet with no LOB. Pt required 2 seated rest breaks in between ~ 5 min intervals d/t fatigue. Pt additionally able to collect items around room to pack up with no LOB. Education provided on using reacher PRN to as energy conservation strategy. Education provided throughout home mgmt task on energy conservation tasks such as taking rest breaks as needed, as well as planning and prioritizing through pts day. Issued handout on energy conservation tasks to increase carryover. pt left seated EOB with all needs within reach.                        Therapy Documentation Precautions:  Precautions Precautions: Fall Precaution Comments: Monitor HR Restrictions Weight Bearing Restrictions: No  Pain: pt reports mild pain in ribs during session, provided rest breaks and repositioning as pain mgmt strategies.     Therapy/Group: Individual Therapy  Pollyann Glen [facility] 12/20/2020, 12:30 PM

## 2020-12-20 NOTE — Progress Notes (Signed)
Patient resting at interval upon rounding, eyes closed, respiration unlabored, coloration adequate, Verbalized several times during shift of pain to left side abdominal rib cage area, tenderness and sensitivity to touch noted,continue current medication regime for pain and occasional discomfort of flatus,states pain score is 8/10 prior to medication and decrease somewhat to 6/10,continue prn and schedule medications, Lidocaine patches, and heating pad.  0600 Continue Contact Isolation. Monitoring.per standard protocols and rounding.The current medical regimen is effective;  continue present plan and medications. regime

## 2020-12-21 DIAGNOSIS — R7989 Other specified abnormal findings of blood chemistry: Secondary | ICD-10-CM

## 2020-12-21 DIAGNOSIS — D62 Acute posthemorrhagic anemia: Secondary | ICD-10-CM

## 2020-12-21 DIAGNOSIS — K760 Fatty (change of) liver, not elsewhere classified: Secondary | ICD-10-CM

## 2020-12-21 DIAGNOSIS — I10 Essential (primary) hypertension: Secondary | ICD-10-CM

## 2020-12-21 DIAGNOSIS — K852 Alcohol induced acute pancreatitis without necrosis or infection: Secondary | ICD-10-CM

## 2020-12-21 DIAGNOSIS — E876 Hypokalemia: Secondary | ICD-10-CM

## 2020-12-21 LAB — MAGNESIUM: Magnesium: 1.8 mg/dL (ref 1.7–2.4)

## 2020-12-21 LAB — BASIC METABOLIC PANEL
Anion gap: 10 (ref 5–15)
BUN: 17 mg/dL (ref 6–20)
CO2: 25 mmol/L (ref 22–32)
Calcium: 9.3 mg/dL (ref 8.9–10.3)
Chloride: 101 mmol/L (ref 98–111)
Creatinine, Ser: 0.63 mg/dL (ref 0.61–1.24)
GFR, Estimated: >60 mL/min (ref >60)
Glucose, Bld: 102 mg/dL — ABNORMAL HIGH (ref 70–99)
Potassium: 4.1 mmol/L (ref 3.5–5.1)
Sodium: 136 mmol/L (ref 135–145)

## 2020-12-21 MED ORDER — TRAZODONE HCL 100 MG PO TABS: 100.0000 mg | ORAL_TABLET | Freq: Every evening | ORAL | 0 refills | Status: AC | PRN

## 2020-12-21 MED ORDER — PANTOPRAZOLE SODIUM 40 MG PO TBEC: 40.0000 mg | DELAYED_RELEASE_TABLET | Freq: Every day | ORAL | 0 refills | Status: AC

## 2020-12-21 MED ORDER — DOCUSATE SODIUM 100 MG PO CAPS: 100.0000 mg | ORAL_CAPSULE | Freq: Two times a day (BID) | ORAL | 0 refills | Status: DC | PRN

## 2020-12-21 MED ORDER — FOLIC ACID 1 MG PO TABS: 1.0000 mg | ORAL_TABLET | Freq: Every day | ORAL | 0 refills | Status: AC

## 2020-12-21 MED ORDER — THIAMINE HCL 100 MG PO TABS: 100.0000 mg | ORAL_TABLET | Freq: Every day | ORAL | Status: AC

## 2020-12-21 MED ORDER — MAGNESIUM GLUCONATE 500 MG PO TABS: 250.0000 mg | ORAL_TABLET | Freq: Every day | ORAL | 0 refills | Status: AC

## 2020-12-21 MED ORDER — CYANOCOBALAMIN 500 MCG PO TABS: 500.0000 ug | ORAL_TABLET | Freq: Every day | ORAL | Status: AC

## 2020-12-21 MED ORDER — POTASSIUM CHLORIDE CRYS ER 20 MEQ PO TBCR: 40.0000 meq | EXTENDED_RELEASE_TABLET | Freq: Every day | ORAL | 0 refills | Status: AC

## 2020-12-21 MED ORDER — OXYCODONE HCL 10 MG PO TABS: 10.0000 mg | ORAL_TABLET | ORAL | 0 refills | Status: DC | PRN

## 2020-12-21 MED ORDER — VITAMIN D (ERGOCALCIFEROL) 1.25 MG (50000 UNIT) PO CAPS: 50000.0000 [IU] | ORAL_CAPSULE | ORAL | 0 refills | Status: AC

## 2020-12-21 MED ORDER — RAMELTEON 8 MG PO TABS: 8.0000 mg | ORAL_TABLET | Freq: Every day | ORAL | 0 refills | Status: AC

## 2020-12-21 MED ORDER — SIMETHICONE 80 MG PO CHEW: 160.0000 mg | CHEWABLE_TABLET | Freq: Four times a day (QID) | ORAL | 0 refills | Status: DC

## 2020-12-21 MED ORDER — LIDOCAINE 5 % EX PTCH: 2.0000 | MEDICATED_PATCH | Freq: Every day | CUTANEOUS | 0 refills | Status: DC

## 2020-12-21 MED ORDER — BACLOFEN 5 MG PO TABS: 5.0000 mg | ORAL_TABLET | Freq: Three times a day (TID) | ORAL | 0 refills | Status: DC | PRN

## 2020-12-21 NOTE — Progress Notes (Signed)
Inpatient Rehabilitation Care Coordinator Discharge Note   Patient Details  Name: Ernest Rodgers MRN: [id number] Date of Birth: Aug 02, 1970   Discharge location: Home  Length of Stay: 8 Days  Discharge activity level: MOD I/Sup  Home/community participation: spouse able to provide intermittent A  Patient response WU:JWJXBJ Literacy - How often do you need to have someone help you when you read instructions, pamphlets, or other written material from your doctor or pharmacy?: Never  Patient response YN:WGNFAO Isolation - How often do you feel lonely or isolated from those around you?: Rarely  Services provided included: SW, Pharmacy, TR, RN, CM, SLP, OT, PT, RD, MD  Financial Services:  Financial Services Utilized: [employer]  Choices offered to/list presented to:    Follow-up services arranged:  Outpatient    Outpatient Servicies: [facility]      Patient response to transportation need: Is the patient able to respond to transportation needs?: Yes In the past 12 months, has lack of transportation kept you from medical appointments or from getting medications?: No In the past 12 months, has lack of transportation kept you from meetings, work, or from getting things needed for daily living?: No    Comments (or additional information):  Patient/Family verbalized understanding of follow-up arrangements:  Yes  Individual responsible for coordination of the follow-up plan: self, [phone]  Confirmed correct DME delivered: Andria Rhein 12/21/2020    Andria Rhein

## 2020-12-21 NOTE — Plan of Care (Signed)
All goals met. Patient discharged at this time in care of wife.  Tolerated all medications prior to discharge and medicated for pain .

## 2020-12-21 NOTE — Progress Notes (Signed)
Restful throughout shift, anticipates dischage home today, pain score 4/10 currently.No acute distress or discomfort noted, continue regime.

## 2020-12-21 NOTE — Progress Notes (Signed)
PROGRESS NOTE   Subjective/Complaints: Stable for d/c today Discussed medications for his alcohol use disorder- will try naltrexone once off opioids.  Discussed lab results  ROS: Pt denies SOB, CP, N/V/C/D, and vision changes, +left sided abdominal pain radiating into left shoulder, +frequent awakenings at night   Objective:   No results found. No results for input(s): WBC, HGB, HCT, PLT in the last 72 hours.  Recent Labs    12/21/20 0512  NA 136  K 4.1  CL 101  CO2 25  GLUCOSE 102*  BUN 17  CREATININE 0.63  CALCIUM 9.3      Intake/Output Summary (Last 24 hours) at 12/21/2020 0957 Last data filed at 12/21/2020 0800 Gross per 24 hour  Intake 860 ml  Output 1800 ml  Net -940 ml        Physical Exam: Vital Signs Blood pressure 110/71, pulse 91, temperature 98.5 F (36.9 C), temperature source Oral, resp. rate 18, height 5\' 10"  (1.778 m), weight 102.5 kg, SpO2 94 %. Gen: no distress, normal appearing HEENT: oral mucosa pink and moist, NCAT Cardio: Reg rate Chest: normal effort, normal rate of breathing Abd: soft, non-distended Ext: no edema Psych: pleasant, normal affect Skin: intact Musculoskeletal:     Cervical back: Normal range of motion. No rigidity.     Comments: UE- biceps 4+/5, triceps 4+/5, WE 5-/5, grip 5-/5, and FA 5-/5 B/L LE- HF 4/5, KE 4/5, KF 4/5, DF and PF 4+/5 B/L   Skin:    Comments: B/L forearm IV's- L doesn't work per pt- R OK- both look well Head shaved No skin breakdown on backside or feet  Neurological:     Mental Status: He is alert and oriented to person, place, and time.     Comments: Verbose with rapid/slurred speech at times. Needs occasional redirection but able to follow commands without difficulty.  Intact to light touch in all 4 extremities    Assessment/Plan: 1. Functional deficits which require 3+ hours per day of interdisciplinary therapy in a comprehensive inpatient  rehab setting. Physiatrist is providing close team supervision and 24 hour management of active medical problems listed below. Physiatrist and rehab team continue to assess barriers to discharge/monitor patient progress toward functional and medical goals  Care Tool:  Bathing    Body parts bathed by patient: Right arm, Left arm, Chest, Abdomen, Front perineal area, Buttocks, Right upper leg, Left upper leg, Face, Right lower leg, Left lower leg         Bathing assist Assist Level: Set up assist     Upper Body Dressing/Undressing Upper body dressing   What is the patient wearing?: Pull over shirt    Upper body assist Assist Level: Independent with assistive device    Lower Body Dressing/Undressing Lower body dressing      What is the patient wearing?: Underwear/pull up, Pants     Lower body assist Assist for lower body dressing: Independent with assitive device     Toileting Toileting    Toileting assist Assist for toileting: Independent with assistive device Assistive Device Comment: urinal   Transfers Chair/bed transfer  Transfers assist     Chair/bed transfer assist level: Independent with  assistive device Chair/bed transfer assistive device: Theatre manager   Ambulation assist      Assist level: Independent with assistive device Assistive device: Cane-straight Max distance: 126ft   Walk 10 feet activity   Assist     Assist level: Independent with assistive device Assistive device: Cane-straight   Walk 50 feet activity   Assist    Assist level: Independent with assistive device Assistive device: Cane-straight    Walk 150 feet activity   Assist Walk 150 feet activity did not occur: Safety/medical concerns (fatigue, generalized weakness/deconditioning, decreased balance/postural control)  Assist level: Independent with assistive device Assistive device: Cane-straight    Walk 10 feet on uneven surface   activity   Assist Walk 10 feet on uneven surfaces activity did not occur: Safety/medical concerns (fatigue, generalized weakness/deconditioning, decreased balance/postural control)   Assist level: Supervision/Verbal cueing [facility])     Wheelchair     Assist Is the patient using a wheelchair?: Yes Type of Wheelchair: Manual    Wheelchair assist level: Supervision/Verbal cueing Max wheelchair distance: 65ft    Wheelchair 50 feet with 2 turns activity    Assist        Assist Level: Supervision/Verbal cueing   Wheelchair 150 feet activity     Assist      Assist Level: Dependent - Patient 0%   Blood pressure 110/71, pulse 91, temperature 98.5 F (36.9 C), temperature source Oral, resp. rate 18, height 5\' 10"  (1.778 m), weight 102.5 kg, SpO2 94 %.  Medical Problem List and Plan: 1.  Debility secondary to [facility] hospital stay from pancreatitis/intubation, splenic hematoma s/p embolization; and flash pulm edema             -patient may  shower             -ELOS/Goals: 10-14 days- supervision  D/c home today 2.  Impaired mobility: SCDs ordered.  3. Pain: Tylenol 650 mg qid. Continue oxycodone to 10mg  q4H prn. Add Baclofen 5mg  BID PRN.   9/5- will give gas x 160 mg QID and suggested walking with therapy to get gas bubble out.   CXR reviewed with patient and stable.  4. Mood: LCSW to follow for evalaution and support.              -antipsychotic agents: N/A 5. Neuropsych: This patient may be intermittently capable of making decisions on his own behalf. 6. Skin/Wound Care: Routine pressure relief measures.  7. Fluids/Electrolytes/Nutrition: Monitor I/O. Check CMET in am.  8. DOE: Encourage pulmonary toilet.  Question fluid overload--wt up by average of 15 lbs?              --Monitor for signs of overload and check daily standing weights   9. SBO/Ileus: Has resolved. Augment bowel program as indicated             --Keep K>4.0 and Mg>2.0 to prevent recurrence              --add Kdur for supplement as on lasix and K<4.0  -discussed with patient that K+ and Magnesium are improving, decrease dose of K+ supplement. Repeat K+ tomorrow. 10. HTN/Tachycardia: Monitor HR TID--on Toprol XL, Lisinopril and Furosemide.   9/6- BP controlled and HR 80s- continue regimen  9/8: check magnesium tomorrow 11. Abnormal LFTs:Resolving--recheck in am  9/5- Last LFTs 9/2- looked resolved 12. Acute blood loss anemia: Continue to monitor--especially with lovenox on board             --stable in  7-8 range.   9/5- Hb 9.7- improving- con't regimen 13. ETOH abuse/Hepatic steatosis: ContinueThiamine and Folic acid. Liver enzymes reviewed and stable.  14. Nose bleed: Monitor for recurrence.  15. Low protein: start Juven 16. Hyperglycemia: d/c Ensure 17. Severe obesity BMI 35.56: provide dietary counseling 18. Hypomagnesemia: discussed lab results- continue magnesium supplement 19. Hypokalemia: resolved with supplementation- continue supplement   >30 minutes spent in discharge of patient including review of medications and follow-up appointments, physical examination, and in answering all patient's questions     LOS: 8 days A FACE TO FACE EVALUATION WAS PERFORMED  Ernest Rodgers 12/21/2020, 9:57 AM

## 2020-12-21 NOTE — Discharge Summary (Signed)
Physician Discharge Summary  Patient ID: Casimir Barcellos MRN: [id number] DOB/AGE: March 27, 1971 50 y.o.  Admit date: 12/13/2020 Discharge date: 12/21/2020  Discharge Diagnoses:  Principal Problem:   Debility Active Problems:   Multiple closed fractures of ribs of left side   HTN (hypertension)   Alcoholic pancreatitis   Hepatic steatosis   Hypokalemia   Acute blood loss anemia   Hypomagnesemia   Low vitamin D level   Discharged Condition: good  Significant Diagnostic Studies: DG Chest 2 View  Result Date: 12/18/2020 CLINICAL DATA:  Left-sided shoulder and rib pain EXAM: CHEST - 2 VIEW COMPARISON:  12/11/2020 chest radiograph FINDINGS: Interval removal of right upper extremity PICC line. Low lung volumes. No focal pulmonary opacity. No pleural effusion. No pneumothorax. Unchanged cardiac and mediastinal silhouette. No acute osseous abnormality. No displaced rib fracture is seen. IMPRESSION: No active cardiopulmonary disease. No displaced rib fracture is seen. Electronically Signed   By: Merilyn Baba M.D.   On: 12/18/2020 11:26     Labs:  Basic Metabolic Panel: Recent Labs  Lab 12/15/20 0509 12/17/20 0559 12/21/20 0512  NA 138 134* 136  K 3.9 4.1 4.1  CL 109 100 101  CO2 22 23 25   GLUCOSE 118* 113* 102*  BUN 5* 11 17  CREATININE 0.57* 0.56* 0.63  CALCIUM 8.4* 9.2 9.3  MG 1.9 1.7 1.8    Liver Function Test:  Hepatic Function Latest Ref Rng & Units 12/14/2020 12/12/2020 12/11/2020  Total Protein 6.5 - 8.1 g/dL 5.5(L) 5.3(L) 5.2(L)  Albumin 3.5 - 5.0 g/dL 2.0(L) 2.1(L) 1.9(L)  AST 15 - 41 U/L 21 40 54(H)  ALT 0 - 44 U/L 29 47(H) 57(H)  Alk Phosphatase 38 - 126 U/L 106 117 125  Total Bilirubin 0.3 - 1.2 mg/dL 1.0 1.5(H) 1.2  Bilirubin, Direct 0.0 - 0.2 mg/dL - - -    CBC: CBC Latest Ref Rng & Units 12/17/2020 12/14/2020 12/13/2020  WBC 4.0 - 10.5 K/uL 6.4 8.2 8.0  Hemoglobin 13.0 - 17.0 g/dL 9.7(L) 8.6(L) 8.2(L)  Hematocrit 39.0 - 52.0 % 30.2(L) 26.7(L) 26.1(L)  Platelets 150 -  400 K/uL 371 282 249     CBG: No results for input(s): GLUCAP in the last 168 hours.  Brief HPI:   Ernest Rodgers is a 50 y.o. male with history of hypertension, renal calculi, gastric bypass, MDD, alcohol abuse who was originally admitted on 11/26/2020 with acute pancreatitis and left rib fractures.  He was treated with supportive care but developed unresponsiveness as well as hypoxia due to aspiration pneumonitis requiring intubation as well as IV antibiotics.  Hospital course was significant for each bouts of agitation, development of ileus as well as acute blood loss anemia due to subscapular splenic hematoma with hemoperitoneum.  He required splenic artery embolization on 08/25 as well as 3 total units of PRBC.  Dr. Tarri Glenn with GI was consulted for input on pancreatitis and recommended supportive care with diet advancement as tolerated.  Mentation and respiratory status were improving however he continued to be limited by weakness and DOE with minimal activity.  CIR was recommended due to functional decline.   Hospital Course: Ernest Rodgers was admitted to rehab 12/13/2020 for inpatient therapies to consist of PT, ST and OT at least three hours five days a week. Past admission physiatrist, therapy team and rehab RN have worked together to provide customized collaborative inpatient rehab.  Patient's ileus has resolved and he is tolerating p.o.'s without difficulty.  He continues to have left-sided abdominal pain radiating  to left scapula.  Chest x-ray done shows no acute disease and evidence of atelectasis.  Baclofen was added to help with muscle spasms as well as simethicone to help with gas and bloating.  Oxycodone has been used for pain management during his stay.  He was educated on tapering this off at discharge. Blood pressures and heart rate were monitored on TID basis and have been stable on current regimen.  Serial check of electrolytes showed hypokalemia that is resolved with addition of potassium  supplement.  Recommend repeat BMET next week to monitor for stability and for dose adjustment as indicated.    Abnormal LFTs have resolved.  Hyperglycemia felt to be reduced Ensure supplements which were discontinued.  Follow-up CBC shows acute blood loss anemia is resolving.  He continues on magnesium supplement for low magnesium level.  Vitamin D levels were low therefore he was started on 50,000 units weekly for supplementation.  He has been educated on importance of abstinence from alcohol due to fatty liver as well as recent pancreatitis episode.  Rozerem as well as trazodone has been used to help manage insomnia.  He has made good gains during his rehab stay and is modified independent level.  He will continue to receive outpatient physical therapy at [facility] outpatient rehab and medicine.   Rehab course: During patient's stay in rehab weekly team conferences were held to monitor patient's progress, set goals and discuss barriers to discharge. At admission, patient required  He  has had improvement in activity tolerance, balance, postural control as well as ability to compensate for deficits.  He is able to complete ADL tasks at modified independent level.  He requires supervision with set up for shower transfers. He is modified independent for transfers and is able to ambulate 100 feet with straight point cane.  He does require multiple rest breaks due to muscle soreness and fatigue.  Family education was completed with wife.    Disposition: home   Diet: Low fat. Low cholesterol   Special Instructions: No driving or strenuous activity till cleared by MD. Absolutely no alcohol. Discharge Instructions     Ambulatory referral to Physical Medicine Rehab   Complete by: As directed    Hospital follow up      Allergies as of 12/21/2020   No Known Allergies      Medication List     STOP taking these medications    omeprazole 20 MG capsule Commonly known as: PRILOSEC   ondansetron 8  MG tablet Commonly known as: ZOFRAN   vitamin A 3 MG (10000 UNITS) capsule       TAKE these medications    acetaminophen 325 MG tablet Commonly known as: TYLENOL Take 2 tablets (650 mg total) by mouth every 6 (six) hours. Notes to patient: OTC   ascorbic acid 1000 MG tablet Commonly known as: VITAMIN C Take 1 tablet (1,000 mg total) by mouth daily. Notes to patient: OTC   Baclofen 5 MG Tabs Take 5 mg by mouth 3 (three) times daily as needed for muscle spasms.   docusate sodium 100 MG capsule Commonly known as: COLACE Take 1 capsule (100 mg total) by mouth 2 (two) times daily as needed for mild constipation. Notes to patient: OTC   FLUoxetine 20 MG tablet Commonly known as: PROZAC Take 20 mg by mouth every morning.   folic acid 1 MG tablet Commonly known as: FOLVITE Take 1 tablet (1 mg total) by mouth daily. Notes to patient: OTC   furosemide 20  MG tablet Commonly known as: LASIX Take 20 mg by mouth every morning.   lidocaine 5 % Commonly known as: LIDODERM Place 2 patches onto the skin daily. Apply at 6 am and remove at 6 pm daily Start taking on: December 22, 2020   lisinopril 10 MG tablet Commonly known as: ZESTRIL Take 10 mg by mouth daily.   magnesium gluconate 500 MG tablet Commonly known as: MAGONATE Take 0.5 tablets (250 mg total) by mouth at bedtime. Notes to patient: OTC   metoprolol succinate 50 MG 24 hr tablet Commonly known as: TOPROL-XL Take 50 mg by mouth daily.   multivitamin with minerals Tabs tablet Take 1 tablet by mouth daily.   Oxycodone HCl 10 MG Tabs--Rx# 20 pills Take 1 tablet (10 mg total) by mouth every 4 (four) hours as needed for severe pain. What changed:  medication strength how much to take when to take this Notes to patient: LIMIT TO 4 PILLS PER DAY--START TAPERING TO 1/2 -1 TAB FOUR TIMES A DAY AS NEEDED. REFILLS TO COME FROM PCP.   pantoprazole 40 MG tablet Commonly known as: PROTONIX Take 1 tablet (40 mg total)  by mouth daily. Start taking on: December 22, 2020   potassium chloride SA 20 MEQ tablet Commonly known as: KLOR-CON Take 2 tablets (40 mEq total) by mouth daily. Start taking on: December 22, 2020   ramelteon 8 MG tablet Commonly known as: ROZEREM Take 1 tablet (8 mg total) by mouth at bedtime.   simethicone 80 MG chewable tablet Commonly known as: MYLICON Chew 2 tablets (160 mg total) by mouth 4 (four) times daily. Notes to patient: OTC   thiamine 100 MG tablet Take 1 tablet (100 mg total) by mouth daily. Notes to patient: OTC   traZODone 100 MG tablet Commonly known as: DESYREL Take 1 tablet (100 mg total) by mouth at bedtime as needed for sleep.   vitamin B-12 500 MCG tablet Commonly known as: CYANOCOBALAMIN Take 1 tablet (500 mcg total) by mouth daily. Start taking on: December 22, 2020 Notes to patient: OTC   Vitamin D (Ergocalciferol) 1.25 MG (50000 UNIT) Caps capsule Commonly known as: DRISDOL Take 1 capsule (50,000 Units total) by mouth every 7 (seven) days. Start taking on: December 22, 2020   zinc sulfate 220 (50 Zn) MG capsule Take 1 capsule (220 mg total) by mouth daily. Notes to patient: OTC        Follow-up Information     Ralene Ok, MD. Call.   Specialty: General Surgery Why: for follow up appointment Contact information: [address] [place] [address] [phone]         Thornton Park, MD. Schedule an appointment as soon as possible for a visit.   Specialty: Gastroenterology Why: for GI issues Contact information: [place] [address] [phone]         Markus Daft, MD. Call.   Specialties: Interventional Radiology, Radiology Why: As needed Contact information: [address] [place] [address] [phone]         Izora Ribas, MD Follow up.   Specialty: Physical Medicine and Rehabilitation Contact information: [address] [place]  [place] [address] [phone]                 Signed: Bary Leriche 12/21/2020, 5:40 PM

## 2021-01-02 ENCOUNTER — Ambulatory Visit: Payer: Managed Care, Other (non HMO) | Admitting: Physical Therapy

## 2021-01-31 ENCOUNTER — Other Ambulatory Visit: Payer: Self-pay

## 2021-01-31 ENCOUNTER — Encounter
Payer: Managed Care, Other (non HMO) | Attending: Physical Medicine and Rehabilitation | Admitting: Physical Medicine and Rehabilitation

## 2021-01-31 ENCOUNTER — Encounter: Payer: Self-pay | Admitting: Physical Medicine and Rehabilitation

## 2021-01-31 VITALS — BP 121/83 | HR 90 | Ht 70.0 in | Wt 217.2 lb

## 2021-01-31 DIAGNOSIS — R5381 Other malaise: Secondary | ICD-10-CM | POA: Diagnosis not present

## 2021-01-31 DIAGNOSIS — Z5181 Encounter for therapeutic drug level monitoring: Secondary | ICD-10-CM | POA: Insufficient documentation

## 2021-01-31 DIAGNOSIS — Z79891 Long term (current) use of opiate analgesic: Secondary | ICD-10-CM | POA: Insufficient documentation

## 2021-01-31 DIAGNOSIS — G894 Chronic pain syndrome: Secondary | ICD-10-CM | POA: Insufficient documentation

## 2021-01-31 DIAGNOSIS — I159 Secondary hypertension, unspecified: Secondary | ICD-10-CM | POA: Diagnosis not present

## 2021-01-31 MED ORDER — LISINOPRIL 5 MG PO TABS: 5.0000 mg | ORAL_TABLET | Freq: Every day | ORAL | 3 refills | Status: AC

## 2021-01-31 NOTE — Addendum Note (Signed)
Addended by: Silas Sacramento T on: 01/31/2021 12:27 PM   Modules accepted: Orders

## 2021-01-31 NOTE — Progress Notes (Signed)
Subjective:    Patient ID: Ernest Rodgers, male    DOB: [birth year], 50 y.o.   MRN: [id number]  HPI Ernest Rodgers is a 50 year old man who presents for hospital follow-up after CIR admission for debility.  1) Debility -has recovered very well.  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  -he feels the nerves firing in his legs -he has had only 4 days with back pain -he felt a lot of pain benefit with the oxycodone.  -he does not want to be on opioids but he wants to be able to do his best therapy.    Pain Inventory Average Pain 4 Pain Right Now 5 My pain is constant, tingling, and aching  In the last 24 hours, has pain interfered with the following? General activity 10 Relation with others 5 Enjoyment of life 10 What TIME of day is your pain at its worst? morning  and night Sleep (in general) Good  Pain is worse with: walking and sitting Pain improves with: rest and medication Relief from Meds: 8  walk without assistance ability to climb steps?  yes do you drive?  yes transfers alone Do you have any goals in this area?  yes  employed # of hrs/week Nurse on Medical Leave  weakness tingling trouble walking  Any changes since last visit?  no  Any changes since last visit?  no    Family History  Problem Relation Age of Onset   Cancer Mother    Heart disease Father    Social History   Socioeconomic History   Marital status: Married    Spouse name: Not on file   Number of children: Not on file   Years of education: Not on file   Highest education level: Not on file  Occupational History   Not on file  Tobacco Use   Smoking status: Former    Types: Cigarettes   Smokeless tobacco: Never  Vaping Use   Vaping Use: Never used  Substance and Sexual Activity   Alcohol use: Not Currently   Drug  use: Not Currently   Sexual activity: Not on file  Other Topics Concern   Not on file  Social History Narrative   Not on file   Social Determinants of Health   Financial Resource Strain: Not on file  Food Insecurity: Not on file  Transportation Needs: Not on file  Physical Activity: Not on file  Stress: Not on file  Social Connections: Not on file   Past Surgical History:  Procedure Laterality Date   GASTRECTOMY     IR ANGIOGRAM VISCERAL SELECTIVE  12/06/2020   IR EMBO ART  VEN HEMORR LYMPH EXTRAV  INC GUIDE ROADMAPPING  12/06/2020   IR US GUIDE VASC ACCESS RIGHT  12/06/2020   Past Medical History:  Diagnosis Date   Hypertension    Kidney stone    BP 121/83   Pulse 90   Ht 5\' 10"  (1.778 m)   Wt 217 lb 3.2 oz (98.5 kg)   SpO2 96%   BMI 31.16 kg/m   Opioid Risk Score:   Fall Risk Score:  `1  Depression screen PHQ 2/9  Depression screen PHQ 2/9 01/31/2021  Decreased Interest 0  Down, Depressed, Hopeless 0  PHQ - 2 Score 0  Altered sleeping 0  Tired, decreased energy 0  Change in appetite 1  Feeling bad or failure about yourself  0  Trouble concentrating 0  Moving slowly or fidgety/restless 0  Suicidal thoughts 0  PHQ-9 Score 1    Review of Systems  Respiratory:  Positive for apnea.   Gastrointestinal:  Positive for abdominal pain and diarrhea.  Musculoskeletal:  Positive for gait problem.       Tingling aching in legs, feet & hands  Neurological:  Positive for weakness and numbness.  All other systems reviewed and are negative.     Objective:   Physical Exam Gen: no distress, normal appearing HEENT: oral mucosa pink and moist, NCAT Cardio: Reg rate Chest: normal effort, normal rate of breathing Abd: soft, non-distended Ext: no edema Psych: pleasant, normal affect Skin: intact Neuro: Alert and oriented x3 Musculoskeletal: 5/5 strength throughout     Assessment & Plan:  1) Chronic pain syndrome secondary to restless leg syndrome -encouraged  trial of Requip   2) Obesity BMI, 217 lbs -Educated regarding health benefits of weight loss- for pain, general health, chronic disease prevention, immune health, mental health.  -Will monitor weight every visit.  -Consider Roobois tea daily.  -Discussed the benefits of intermittent fasting. -Discussed foods that can assist in weight loss: 1) leafy greens- high in fiber and nutrients 2) dark chocolate- improves metabolism (if prefer sweetened, best to sweeten with honey instead of sugar).  3) cruciferous vegetables- high in fiber and protein 4) full fat yogurt: high in healthy fat, protein, calcium, and probiotics 5) apples- high in a variety of phytochemicals 6) nuts- high in fiber and protein that increase feelings of fullness 7) grapefruit: rich in nutrients, antioxidants, and fiber (not to be taken with anticoagulation) 8) beans- high in protein and fiber 9) salmon- has high quality protein and healthy fats 10) green tea- rich in polyphenols 11) eggs- rich in choline and vitamin D 12) tuna- high protein, boosts metabolism 13) avocado- decreases visceral abdominal fat 14) chicken (pasture raised): high in protein and iron 15) blueberries- reduce abdominal fat and cholesterol 16) whole grains- decreases calories retained during digestion, speeds metabolism 17) chia seeds- curb appetite 18) chilies- increases fat metabolism  -Discussed supplements that can be used:  1) Metatrim 400mg  BID 30 minutes before breakfast and dinner  2) Sphaeranthus indicus and Garcinia mangostana (combinations of these and #1 can be found in capsicum and zychrome  3) green coffee bean extract 400mg  twice per day or Irvingia (african mango) 150 to 300mg  twice per day.  3) Debility -cleared to return to work.  -continue PT and OT

## 2021-01-31 NOTE — Patient Instructions (Signed)
HTN: -BP is 121/83 today.  -Advised checking BP daily at home and logging results to bring into follow-up appointment with her PCP and myself. -Reviewed BP meds today.  -Advised regarding healthy foods that can help lower blood pressure and provided with a list: 1) citrus foods- high in vitamins and minerals 2) salmon and other fatty fish - reduces inflammation and oxylipins 3) swiss chard (leafy green)- high level of nitrates 4) pumpkin seeds- one of the best natural sources of magnesium 5) Beans and lentils- high in fiber, magnesium, and potassium 6) Berries- high in flavonoids 7) Amaranth (whole grain, can be cooked similarly to rice and oats)- high in magnesium and fiber 8) Pistachios- even more effective at reducing BP than other nuts 9) Carrots- high in phenolic compounds that relax blood vessels and reduce inflammation 10) Celery- contain phthalides that relax tissues of arterial walls 11) Tomatoes- can also improve cholesterol and reduce risk of heart disease 12) Broccoli- good source of magnesium, calcium, and potassium 13) Greek yogurt: high in potassium and calcium 14) Herbs and spices: Celery seed, cilantro, saffron, lemongrass, black cumin, ginseng, cinnamon, cardamom, sweet basil, and ginger 15) Chia and flax seeds- also help to lower cholesterol and blood sugar 16) Beets- high levels of nitrates that relax blood vessels  17) spinach and bananas- high in potassium  -Provided lise of supplements that can help with hypertension:  1) magnesium: one high quality brand is Bioptemizers since it contains all 7 types of magnesium, otherwise over the counter magnesium gluconate 400mg  is a good option 2) B vitamins 3) vitamin D 4) potassium 5) CoQ10 6) L-arginine 7) Vitamin C 8) Beetroot -Educated that goal BP is 120/80. -Made goal to incorporate some of the above foods into diet.    Obesity: -Educated that current weight is 217 and current BMI is 31.16 -Educated regarding  health benefits of weight loss- for pain, general health, chronic disease prevention, immune health, mental health.  -Will monitor weight every visit.  -Consider Roobois tea daily.  -Discussed the benefits of intermittent fasting. -Discussed foods that can assist in weight loss: 1) leafy greens- high in fiber and nutrients 2) dark chocolate- improves metabolism (if prefer sweetened, best to sweeten with honey instead of sugar).  3) cruciferous vegetables- high in fiber and protein 4) full fat yogurt: high in healthy fat, protein, calcium, and probiotics 5) apples- high in a variety of phytochemicals 6) nuts- high in fiber and protein that increase feelings of fullness 7) grapefruit: rich in nutrients, antioxidants, and fiber (not to be taken with anticoagulation) 8) beans- high in protein and fiber 9) salmon- has high quality protein and healthy fats 10) green tea- rich in polyphenols 11) eggs- rich in choline and vitamin D 12) tuna- high protein, boosts metabolism 13) avocado- decreases visceral abdominal fat 14) chicken (pasture raised): high in protein and iron 15) blueberries- reduce abdominal fat and cholesterol 16) whole grains- decreases calories retained during digestion, speeds metabolism 17) chia seeds- curb appetite 18) chilies- increases fat metabolism  -Discussed supplements that can be used:  1) Metatrim 400mg  BID 30 minutes before breakfast and dinner  2) Sphaeranthus indicus and Garcinia mangostana (combinations of these and #1 can be found in capsicum and zychrome  3) green coffee bean extract 400mg  twice per day or Irvingia (african mango) 150 to 300mg  twice per day.

## 2021-02-07 ENCOUNTER — Telehealth: Payer: Self-pay | Admitting: *Deleted

## 2021-02-07 LAB — TOXASSURE SELECT,+ANTIDEPR,UR

## 2021-02-07 NOTE — Telephone Encounter (Signed)
Urine drug screen for this encounter is consistent for prescribed medication. Oxycodone is absent but pt reported he had not had in 3 days.

## 2021-02-12 ENCOUNTER — Other Ambulatory Visit: Payer: Self-pay | Admitting: Physical Medicine and Rehabilitation

## 2021-02-12 ENCOUNTER — Telehealth: Payer: Self-pay

## 2021-02-12 MED ORDER — TRAMADOL HCL 50 MG PO TABS: 50.0000 mg | ORAL_TABLET | Freq: Every day | ORAL | 0 refills | Status: DC | PRN

## 2021-02-12 NOTE — Telephone Encounter (Signed)
Mr. Cuff called back. Per patient he would like to start the Rx Tramadol, that was discussed. Please send to [facility] on [address].

## 2021-02-18 ENCOUNTER — Telehealth: Payer: Self-pay

## 2021-02-18 NOTE — Telephone Encounter (Signed)
Patient called and stated the Tramadol is not controlling the pain. He states he needs something stronger like Norco or Percocet. He prefers Norco because he says it's not as addicting as Percocet. His pharmacy is [facility] [address]

## 2021-02-19 ENCOUNTER — Other Ambulatory Visit: Payer: Self-pay | Admitting: Physical Medicine and Rehabilitation

## 2021-02-19 MED ORDER — HYDROCODONE-ACETAMINOPHEN 5-325 MG PO TABS: 1.0000 | ORAL_TABLET | Freq: Every day | ORAL | 0 refills | Status: DC | PRN

## 2021-03-21 ENCOUNTER — Other Ambulatory Visit: Payer: Self-pay

## 2021-03-21 ENCOUNTER — Encounter: Payer: Managed Care, Other (non HMO) | Attending: Physical Medicine and Rehabilitation | Admitting: Registered Nurse

## 2021-03-21 ENCOUNTER — Encounter: Payer: Self-pay | Admitting: Registered Nurse

## 2021-03-21 VITALS — BP 119/79 | HR 71 | Ht 70.0 in | Wt 214.8 lb

## 2021-03-21 DIAGNOSIS — G2581 Restless legs syndrome: Secondary | ICD-10-CM | POA: Insufficient documentation

## 2021-03-21 DIAGNOSIS — Z5181 Encounter for therapeutic drug level monitoring: Secondary | ICD-10-CM | POA: Diagnosis not present

## 2021-03-21 DIAGNOSIS — G894 Chronic pain syndrome: Secondary | ICD-10-CM | POA: Diagnosis not present

## 2021-03-21 DIAGNOSIS — Z79891 Long term (current) use of opiate analgesic: Secondary | ICD-10-CM | POA: Insufficient documentation

## 2021-03-21 MED ORDER — HYDROCODONE-ACETAMINOPHEN 5-325 MG PO TABS: 1.0000 | ORAL_TABLET | Freq: Two times a day (BID) | ORAL | 0 refills | Status: DC | PRN

## 2021-03-21 NOTE — Progress Notes (Signed)
Subjective:    Patient ID: Ernest Rodgers, male    DOB: [birth year], 50 y.o.   MRN: [id number]  HPI: Ernest Rodgers is a 50 y.o. male who returns for follow up appointment for chronic pain and medication refill. He states his pain is located in his left shoulder and lower extremities with tingling and burning. He also reports only receiving 4 hours or relief with his current medication regimen. He rates his pain 5. His current exercise regime is walking, performing stretching exercises and lifting light weights.  Ernest Rodgers Morphine equivalent is 5.00 MME.   Last UDS was Performed on 01/31/2021, it was consistent.     Pain Inventory Average Pain 6 Pain Right Now 5 My pain is intermittent, sharp, and aching  In the last 24 hours, has pain interfered with the following? General activity 6 Relation with others 1 Enjoyment of life 4 What TIME of day is your pain at its worst? morning , daytime, and evening Sleep (in general) Fair  Pain is worse with: walking, bending, and inactivity Pain improves with: therapy/exercise and medication Relief from Meds: 3  Family History  Problem Relation Age of Onset   Cancer Mother    Heart disease Father    Social History   Socioeconomic History   Marital status: Married    Spouse name: Not on file   Number of children: Not on file   Years of education: Not on file   Highest education level: Not on file  Occupational History   Not on file  Tobacco Use   Smoking status: Former    Types: Cigarettes   Smokeless tobacco: Never  Vaping Use   Vaping Use: Never used  Substance and Sexual Activity   Alcohol use: Not Currently   Drug use: Not Currently   Sexual activity: Not on file  Other Topics Concern   Not on file  Social History Narrative   Not on file   Social Determinants of Health   Financial Resource Strain: Not on file  Food Insecurity: Not on file  Transportation Needs: Not on file  Physical Activity: Not on file  Stress: Not on  file  Social Connections: Not on file   Past Surgical History:  Procedure Laterality Date   GASTRECTOMY     IR ANGIOGRAM VISCERAL SELECTIVE  12/06/2020   IR EMBO ART  VEN HEMORR LYMPH EXTRAV  INC GUIDE ROADMAPPING  12/06/2020   IR US GUIDE VASC ACCESS RIGHT  12/06/2020   Past Surgical History:  Procedure Laterality Date   GASTRECTOMY     IR ANGIOGRAM VISCERAL SELECTIVE  12/06/2020   IR EMBO ART  VEN HEMORR LYMPH EXTRAV  INC GUIDE ROADMAPPING  12/06/2020   IR US GUIDE VASC ACCESS RIGHT  12/06/2020   Past Medical History:  Diagnosis Date   Hypertension    Kidney stone    BP 119/79   Pulse 71   Ht 5\' 10"  (1.778 m)   Wt 214 lb 12.8 oz (97.4 kg)   SpO2 96%   BMI 30.82 kg/m   Opioid Risk Score:   Fall Risk Score:  `1  Depression screen PHQ 2/9  Depression screen PHQ 2/9 01/31/2021  Decreased Interest 0  Down, Depressed, Hopeless 0  PHQ - 2 Score 0  Altered sleeping 0  Tired, decreased energy 0  Change in appetite 1  Feeling bad or failure about yourself  0  Trouble concentrating 0  Moving slowly or fidgety/restless 0  Suicidal thoughts 0  PHQ-9  Score 1     Review of Systems  Constitutional: Negative.   HENT: Negative.    Eyes: Negative.   Respiratory: Negative.    Cardiovascular: Negative.   Gastrointestinal: Negative.   Endocrine: Negative.   Genitourinary: Negative.   Musculoskeletal:  Positive for arthralgias.  Skin: Negative.   Allergic/Immunologic: Negative.   Neurological: Negative.   Hematological: Negative.   Psychiatric/Behavioral: Negative.    All other systems reviewed and are negative.     Objective:   Physical Exam Vitals and nursing note reviewed.  Constitutional:      Appearance: Normal appearance.  Cardiovascular:     Rate and Rhythm: Normal rate and regular rhythm.     Pulses: Normal pulses.     Heart sounds: Normal heart sounds.  Pulmonary:     Effort: Pulmonary effort is normal.     Breath sounds: Normal breath sounds.   Musculoskeletal:     Cervical back: Normal range of motion and neck supple.     Comments: Normal Muscle Bulk and Muscle Testing Reveals:  Upper Extremities: Right: Full ROM and Muscle Strength 5/5 Left Lower Extremity: Decreased ROM 90 Degrees and Muscle Strength 5/5  Lower Extremities: Full ROM and Muscle Strength 5/5 Arises from Table with Ease Narrow Based  Gait     Skin:    General: Skin is warm and dry.  Neurological:     Mental Status: He is alert and oriented to person, place, and time.  Psychiatric:        Mood and Affect: Mood normal.        Behavior: Behavior normal.         Assessment & Plan:  Chronic Pain Syndrome Related to Restless Leg Syndrome: Continue Requip. Continue to Monitor.  Chronic Pain Syndrome: Refilled: Increased: Hydrocodone 5mg /325 mg one tablet twice a day as needed for pain #60. We will continue the opioid monitoring program, this consists of regular clinic visits, examinations, urine drug screen, pill counts as well as use of Controlled Substance Reporting system. A 12 month History has been reviewed on the [place] Controlled Substance Reporting System on 12/09/202  F/U in 2 months

## 2021-03-22 ENCOUNTER — Ambulatory Visit: Payer: Managed Care, Other (non HMO) | Admitting: Registered Nurse

## 2021-04-16 ENCOUNTER — Encounter: Payer: Self-pay | Admitting: Physical Medicine and Rehabilitation

## 2021-04-16 ENCOUNTER — Telehealth: Payer: Self-pay | Admitting: Registered Nurse

## 2021-04-16 MED ORDER — HYDROCODONE-ACETAMINOPHEN 5-325 MG PO TABS: 1.0000 | ORAL_TABLET | Freq: Three times a day (TID) | ORAL | 0 refills | Status: DC | PRN

## 2021-04-16 NOTE — Telephone Encounter (Signed)
PMP was Reviewed. Hydrocodone e-scribed today. Mr. Ernest Rodgers is aware via My-Chart message.

## 2021-05-14 ENCOUNTER — Encounter: Payer: Managed Care, Other (non HMO) | Admitting: Registered Nurse

## 2021-05-16 ENCOUNTER — Ambulatory Visit: Payer: Managed Care, Other (non HMO) | Admitting: Registered Nurse

## 2021-05-23 ENCOUNTER — Other Ambulatory Visit: Payer: Self-pay

## 2021-05-23 ENCOUNTER — Encounter: Payer: Managed Care, Other (non HMO) | Attending: Physical Medicine and Rehabilitation | Admitting: Registered Nurse

## 2021-05-23 ENCOUNTER — Encounter: Payer: Self-pay | Admitting: Registered Nurse

## 2021-05-23 VITALS — BP 147/91 | HR 78 | Ht 70.0 in | Wt 221.0 lb

## 2021-05-23 DIAGNOSIS — Z79891 Long term (current) use of opiate analgesic: Secondary | ICD-10-CM | POA: Insufficient documentation

## 2021-05-23 DIAGNOSIS — G894 Chronic pain syndrome: Secondary | ICD-10-CM | POA: Insufficient documentation

## 2021-05-23 DIAGNOSIS — G8929 Other chronic pain: Secondary | ICD-10-CM | POA: Insufficient documentation

## 2021-05-23 DIAGNOSIS — G2581 Restless legs syndrome: Secondary | ICD-10-CM | POA: Insufficient documentation

## 2021-05-23 DIAGNOSIS — M25561 Pain in right knee: Secondary | ICD-10-CM | POA: Diagnosis present

## 2021-05-23 DIAGNOSIS — M542 Cervicalgia: Secondary | ICD-10-CM | POA: Diagnosis present

## 2021-05-23 DIAGNOSIS — M25562 Pain in left knee: Secondary | ICD-10-CM | POA: Insufficient documentation

## 2021-05-23 DIAGNOSIS — Z5181 Encounter for therapeutic drug level monitoring: Secondary | ICD-10-CM | POA: Diagnosis not present

## 2021-05-23 DIAGNOSIS — M546 Pain in thoracic spine: Secondary | ICD-10-CM | POA: Diagnosis present

## 2021-05-23 MED ORDER — HYDROCODONE-ACETAMINOPHEN 5-325 MG PO TABS: 1.0000 | ORAL_TABLET | Freq: Three times a day (TID) | ORAL | 0 refills | Status: DC | PRN

## 2021-05-23 NOTE — Progress Notes (Signed)
Subjective:    Patient ID: Ernest Rodgers, male    DOB: 06-Jan-1971, 51 y.o.   MRN: [id number]  HPI: Ernest Rodgers is a 51 y.o. male who returns for follow up appointment for chronic pain and medication refill. He states his  pain is located in his neck, upper-back and bilateral knee pain.He rates his pain 6. His current exercise regime is walking and performing stretching exercises. With bands.   Ernest Rodgers willsend a My-Chart message to Ernest Rodgers regarding Chiropractor visit, he will await Ernest Rodgers response.   Ernest Rodgers Morphine equivalent is 15.00 MME.   UDS ordered today.   Pain Inventory Average Pain 5 Pain Right Now 6 My pain is intermittent, sharp, tingling, and aching  In the last 24 hours, has pain interfered with the following? General activity 4 Relation with others 0 Enjoyment of life 4 What TIME of day is your pain at its worst? morning  and night Sleep (in general) Fair  Pain is worse with: walking, bending, sitting, and standing Pain improves with: rest, therapy/exercise, and medication Relief from Meds: 7  Family History  Problem Relation Age of Onset   Cancer Mother    Heart disease Father    Social History   Socioeconomic History   Marital status: Married    Spouse name: Not on file   Number of children: Not on file   Years of education: Not on file   Highest education level: Not on file  Occupational History   Not on file  Tobacco Use   Smoking status: Former    Types: Cigarettes   Smokeless tobacco: Never  Vaping Use   Vaping Use: Never used  Substance and Sexual Activity   Alcohol use: Not Currently   Drug use: Not Currently   Sexual activity: Not on file  Other Topics Concern   Not on file  Social History Narrative   Not on file   Social Determinants of Health   Financial Resource Strain: Not on file  Food Insecurity: Not on file  Transportation Needs: Not on file  Physical Activity: Not on file  Stress: Not on file  Social  Connections: Not on file   Past Surgical History:  Procedure Laterality Date   GASTRECTOMY     IR ANGIOGRAM VISCERAL SELECTIVE  12/06/2020   IR EMBO ART  VEN HEMORR LYMPH EXTRAV  INC GUIDE ROADMAPPING  12/06/2020   IR US GUIDE VASC ACCESS RIGHT  12/06/2020   Past Surgical History:  Procedure Laterality Date   GASTRECTOMY     IR ANGIOGRAM VISCERAL SELECTIVE  12/06/2020   IR EMBO ART  VEN HEMORR LYMPH EXTRAV  INC GUIDE ROADMAPPING  12/06/2020   IR US GUIDE VASC ACCESS RIGHT  12/06/2020   Past Medical History:  Diagnosis Date   Hypertension    Kidney stone    There were no vitals taken for this visit.  Opioid Risk Score:   Fall Risk Score:  `1  Depression screen PHQ 2/9  Depression screen PHQ 2/9 01/31/2021  Decreased Interest 0  Down, Depressed, Hopeless 0  PHQ - 2 Score 0  Altered sleeping 0  Tired, decreased energy 0  Change in appetite 1  Feeling bad or failure about yourself  0  Trouble concentrating 0  Moving slowly or fidgety/restless 0  Suicidal thoughts 0  PHQ-9 Score 1      Review of Systems  Musculoskeletal:        Shoulder pain Pelvic pain Bilateral knee pain  All other systems reviewed and are negative.     Objective:   Physical Exam Vitals and nursing note reviewed.  Constitutional:      Appearance: Normal appearance.  Cardiovascular:     Rate and Rhythm: Normal rate and regular rhythm.     Pulses: Normal pulses.     Heart sounds: Normal heart sounds.  Pulmonary:     Effort: Pulmonary effort is normal.     Breath sounds: Normal breath sounds.  Musculoskeletal:     Cervical back: Normal range of motion and neck supple.     Comments: Normal Muscle Bulk and Muscle Testing Reveals:  Upper Extremities: Full ROM and Muscle Strength 5/5 Thoracic Paraspinal Tenderness: T-1-T-2 Lower Extremities: Full ROM and Muscle Strength 5/5 Arises from chair with ease Narrow Based  Gait     Skin:    General: Skin is warm and dry.  Neurological:     Mental  Status: He is alert and oriented to person, place, and time.  Psychiatric:        Mood and Affect: Mood normal.        Behavior: Behavior normal.         Assessment & Plan:  Chronic Pain Syndrome Related to Restless Leg Syndrome: Continue Requip. Continue to Monitor. 05/23/2021 Chronic Pain Syndrome: Refilled: Hydrocodone 5mg /325 mg one tablet three times  a day as needed for pain #90. Second script sent to pharmacy, to accommodate scheduled appointment, We will continue the opioid monitoring program, this consists of regular clinic visits, examinations, urine drug screen, pill counts as well as use of [place] Controlled Substance Reporting system. A 12 month History has been reviewed on the [place] on 05/23/2021 Cervicalgia: Continue HEP as Tolerated. Continue to Monitor.  Bilateral Thoracic Pain: Continue HEP as tolerated. Continue to Monitor.  5. Chronic Bilateral Knee Pain: Continue HEP as Tolerated. Continue to Monitor.   F/U in 2 months

## 2021-05-30 LAB — TOXASSURE SELECT,+ANTIDEPR,UR

## 2021-05-31 ENCOUNTER — Telehealth: Payer: Self-pay | Admitting: *Deleted

## 2021-05-31 NOTE — Telephone Encounter (Signed)
Urine drug screen for this encounter is consistent for prescribed medication 

## 2021-07-22 ENCOUNTER — Telehealth: Payer: Self-pay | Admitting: Registered Nurse

## 2021-07-22 MED ORDER — HYDROCODONE-ACETAMINOPHEN 5-325 MG PO TABS: 1.0000 | ORAL_TABLET | Freq: Three times a day (TID) | ORAL | 0 refills | Status: DC | PRN

## 2021-07-22 NOTE — Telephone Encounter (Signed)
PMP was Reviewed.  ?Hydrocodone e-scribed.  ?He has an appointment with Dr Ranell Patrick in two weeks. He is aware of the above via My-Chart  ?

## 2021-07-26 ENCOUNTER — Ambulatory Visit: Payer: Managed Care, Other (non HMO) | Admitting: Physical Medicine and Rehabilitation

## 2021-07-30 ENCOUNTER — Encounter: Payer: Self-pay | Admitting: Registered Nurse

## 2021-07-30 ENCOUNTER — Encounter: Payer: Managed Care, Other (non HMO) | Attending: Physical Medicine and Rehabilitation | Admitting: Registered Nurse

## 2021-07-30 VITALS — BP 108/72 | HR 61 | Ht 70.0 in | Wt 219.8 lb

## 2021-07-30 DIAGNOSIS — M5416 Radiculopathy, lumbar region: Secondary | ICD-10-CM | POA: Diagnosis present

## 2021-07-30 DIAGNOSIS — G2581 Restless legs syndrome: Secondary | ICD-10-CM | POA: Diagnosis present

## 2021-07-30 DIAGNOSIS — G894 Chronic pain syndrome: Secondary | ICD-10-CM | POA: Diagnosis present

## 2021-07-30 DIAGNOSIS — G8929 Other chronic pain: Secondary | ICD-10-CM | POA: Diagnosis present

## 2021-07-30 DIAGNOSIS — M25512 Pain in left shoulder: Secondary | ICD-10-CM | POA: Diagnosis present

## 2021-07-30 DIAGNOSIS — M62838 Other muscle spasm: Secondary | ICD-10-CM | POA: Diagnosis present

## 2021-07-30 DIAGNOSIS — Z79891 Long term (current) use of opiate analgesic: Secondary | ICD-10-CM | POA: Insufficient documentation

## 2021-07-30 DIAGNOSIS — Z5181 Encounter for therapeutic drug level monitoring: Secondary | ICD-10-CM | POA: Diagnosis present

## 2021-07-30 MED ORDER — BACLOFEN 10 MG PO TABS: 10.0000 mg | ORAL_TABLET | Freq: Two times a day (BID) | ORAL | 1 refills | Status: DC | PRN

## 2021-07-30 MED ORDER — HYDROCODONE-ACETAMINOPHEN 5-325 MG PO TABS: 1.0000 | ORAL_TABLET | Freq: Three times a day (TID) | ORAL | 0 refills | Status: AC | PRN

## 2021-07-30 NOTE — Progress Notes (Signed)
? ?Subjective:  ? ? Patient ID: Ernest Rodgers, male    DOB: 1970/07/07, 51 y.o.   MRN: [id number] ? ?HPI: Ernest Rodgers is a 51 y.o. male who returns for follow up appointment for chronic pain and medication refill. He states his pain is located in his left shoulder, lower back pain radiating into his left buttock and generalized pain all over.He  rates his pain 5. His current exercise regime is walking and performing stretching exercises. ?  ?Mr. Rullo Morphine equivalent is 15.00 MME.    Last UDS was Performed on 05/23/2021, it was consistent.  ? ?Pain Inventory ?Average Pain 5 ?Pain Right Now 5 ?My pain is sharp, dull, tingling, and aching ? ?In the last 24 hours, has pain interfered with the following? ?General activity 3 ?Relation with others 0 ?Enjoyment of life 3 ?What TIME of day is your pain at its worst? morning  and evening ?Sleep (in general) Fair ? ?Pain is worse with: sitting and inactivity ?Pain improves with: therapy/exercise, medication, and TENS ?Relief from Meds: 2 ? ?Family History  ?Problem Relation Age of Onset  ? Cancer Mother   ? Heart disease Father   ? ?Social History  ? ?Socioeconomic History  ? Marital status: Married  ?  Spouse name: Not on file  ? Number of children: Not on file  ? Years of education: Not on file  ? Highest education level: Not on file  ?Occupational History  ? Not on file  ?Tobacco Use  ? Smoking status: Former  ?  Types: Cigarettes  ? Smokeless tobacco: Never  ?Vaping Use  ? Vaping Use: Never used  ?Substance and Sexual Activity  ? Alcohol use: Not Currently  ? Drug use: Not Currently  ? Sexual activity: Not on file  ?Other Topics Concern  ? Not on file  ?Social History Narrative  ? Not on file  ? ?Social Determinants of Health  ? ?Financial Resource Strain: Not on file  ?Food Insecurity: Not on file  ?Transportation Needs: Not on file  ?Physical Activity: Not on file  ?Stress: Not on file  ?Social Connections: Not on file  ? ?Past Surgical History:  ?Procedure Laterality  Date  ? GASTRECTOMY    ? IR ANGIOGRAM VISCERAL SELECTIVE  12/06/2020  ? IR EMBO ART  VEN HEMORR LYMPH EXTRAV  INC GUIDE ROADMAPPING  12/06/2020  ? IR US GUIDE VASC ACCESS RIGHT  12/06/2020  ? ?Past Surgical History:  ?Procedure Laterality Date  ? GASTRECTOMY    ? IR ANGIOGRAM VISCERAL SELECTIVE  12/06/2020  ? IR EMBO ART  VEN HEMORR LYMPH EXTRAV  INC GUIDE ROADMAPPING  12/06/2020  ? IR US GUIDE VASC ACCESS RIGHT  12/06/2020  ? ?Past Medical History:  ?Diagnosis Date  ? Hypertension   ? Kidney stone   ? ?BP 108/72   Pulse 61   Ht 5\' 10"  (1.778 m)   Wt 219 lb 12.8 oz (99.7 kg)   SpO2 96%   BMI 31.54 kg/m?  ? ?Opioid Risk Score:   ?Fall Risk Score:  `1 ? ?Depression screen PHQ 2/9 ? ? ?  07/30/2021  ? 11:43 AM 05/23/2021  ?  8:53 AM 01/31/2021  ? 11:40 AM  ?Depression screen PHQ 2/9  ?Decreased Interest 0 0 0  ?Down, Depressed, Hopeless 0 0 0  ?PHQ - 2 Score 0 0 0  ?Altered sleeping   0  ?Tired, decreased energy   0  ?Change in appetite   1  ?Feeling bad or failure  about yourself    0  ?Trouble concentrating   0  ?Moving slowly or fidgety/restless   0  ?Suicidal thoughts   0  ?PHQ-9 Score   1  ?  ? ?Review of Systems  ?Constitutional: Negative.   ?HENT: Negative.    ?Eyes: Negative.   ?Respiratory: Negative.    ?Cardiovascular: Negative.   ?Gastrointestinal: Negative.   ?Endocrine: Negative.   ?Genitourinary: Negative.   ?Musculoskeletal: Negative.   ?Skin: Negative.   ?Allergic/Immunologic: Negative.   ?Neurological: Negative.   ?Hematological: Negative.   ?Psychiatric/Behavioral: Negative.    ? ?   ?Objective:  ? Physical Exam ?Vitals and nursing note reviewed.  ?Constitutional:   ?   Appearance: Normal appearance.  ?Cardiovascular:  ?   Rate and Rhythm: Normal rate and regular rhythm.  ?   Pulses: Normal pulses.  ?   Heart sounds: Normal heart sounds.  ?Pulmonary:  ?   Effort: Pulmonary effort is normal.  ?   Breath sounds: Normal breath sounds.  ?Musculoskeletal:  ?   Cervical back: Normal range of motion and neck  supple.  ?   Comments: Normal Muscle Bulk and Muscle Testing Reveals: ? Upper Extremities: Full ROM and Muscle Strength 5/5 ?Left AC Joint Tenderness ? Lumbar Paraspinal Tenderness: L-4-L-5 Mainly Left Side  ? Lower Extremities: Full ROM and Muscle Strength 5/5 ?Arises from chair with ease ?Narrow Based  Gait  ?   ?Skin: ?   General: Skin is warm and dry.  ?Neurological:  ?   Mental Status: He is alert and oriented to person, place, and time.  ?Psychiatric:     ?   Mood and Affect: Mood normal.     ?   Behavior: Behavior normal.  ? ? ? ? ?   ?Assessment & Plan:  ?Chronic Pain Syndrome Related to Restless Leg Syndrome: Continue Requip. Continue to Monitor. 07/30/2021 ?Chronic Pain Syndrome: Refilled: Hydrocodone 5mg /325 mg one tablet three times  a day as needed for pain #90. Second script sent to pharmacy, to accommodate scheduled appointment, We will continue the opioid monitoring program, this consists of regular clinic visits, examinations, urine drug screen, pill counts as well as use of [place] Controlled Substance Reporting system. A 12 month History has been reviewed on the [place] Controlled Substance Reporting System on 07/30/2021 ?Cervicalgia: No complaints today. Continue HEP as Tolerated. Continue to Monitor. 07/30/2021 ?Bilateral Thoracic Pain/ Lumbar Radiculitis : Continue HEP as tolerated. Continue to Monitor. 07/30/2021 ?5. Chronic Bilateral Knee Pain: Continue HEP as Tolerated. Continue to Monitor. 07/30/2021 ?6. Chronic Left Shoulder Pain: Ortho Following Continue to Monitor.  ?7. Muscle Spasm: Continue Baclofen. Continue to Monitor.  ? ?F/U in 43month ?  ? ?

## 2021-07-31 ENCOUNTER — Ambulatory Visit: Payer: Managed Care, Other (non HMO) | Admitting: Physical Medicine and Rehabilitation

## 2021-08-05 ENCOUNTER — Encounter: Payer: Managed Care, Other (non HMO) | Admitting: Physical Medicine and Rehabilitation

## 2021-09-04 ENCOUNTER — Encounter: Payer: Managed Care, Other (non HMO) | Attending: Physical Medicine and Rehabilitation | Admitting: Registered Nurse

## 2021-09-04 DIAGNOSIS — M62838 Other muscle spasm: Secondary | ICD-10-CM | POA: Insufficient documentation

## 2021-09-04 DIAGNOSIS — M5416 Radiculopathy, lumbar region: Secondary | ICD-10-CM | POA: Insufficient documentation

## 2021-09-04 DIAGNOSIS — G894 Chronic pain syndrome: Secondary | ICD-10-CM | POA: Insufficient documentation

## 2021-09-04 DIAGNOSIS — Z5181 Encounter for therapeutic drug level monitoring: Secondary | ICD-10-CM | POA: Insufficient documentation

## 2021-09-04 DIAGNOSIS — G8929 Other chronic pain: Secondary | ICD-10-CM | POA: Insufficient documentation

## 2021-09-04 DIAGNOSIS — G2581 Restless legs syndrome: Secondary | ICD-10-CM | POA: Insufficient documentation

## 2021-09-04 DIAGNOSIS — M25512 Pain in left shoulder: Secondary | ICD-10-CM | POA: Insufficient documentation

## 2021-09-04 DIAGNOSIS — Z79891 Long term (current) use of opiate analgesic: Secondary | ICD-10-CM | POA: Insufficient documentation

## 2021-09-25 ENCOUNTER — Other Ambulatory Visit: Payer: Self-pay | Admitting: Registered Nurse

## 2021-11-22 ENCOUNTER — Other Ambulatory Visit: Payer: Self-pay | Admitting: Registered Nurse

## 2022-03-07 IMAGING — US IR ANGIO/VISCERAL SELECTIVE EA VESSEL WO/W FLUSH
1 series · 2 of 2 positions shown · non-contrast
Comparison: none

INDICATION: 50-year-old with a large subcapsular splenic hematoma and decreasing
hemoglobin. Plan for arteriography and splenic artery embolization.

[Series 1: ir angio/visceral selective ea vessel wo/w flush · 2 of 2 slices shown]
[im 1/2]
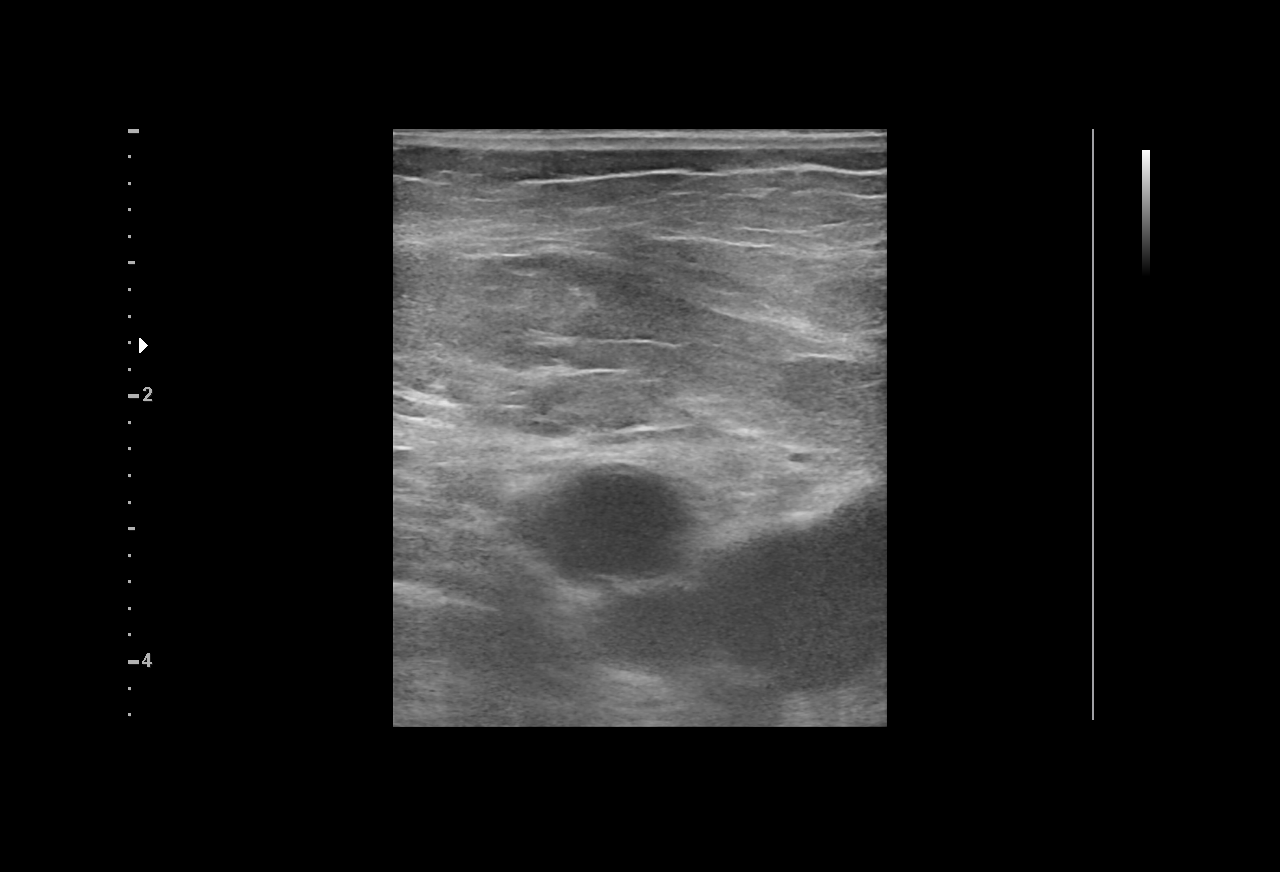
[im 2/2]
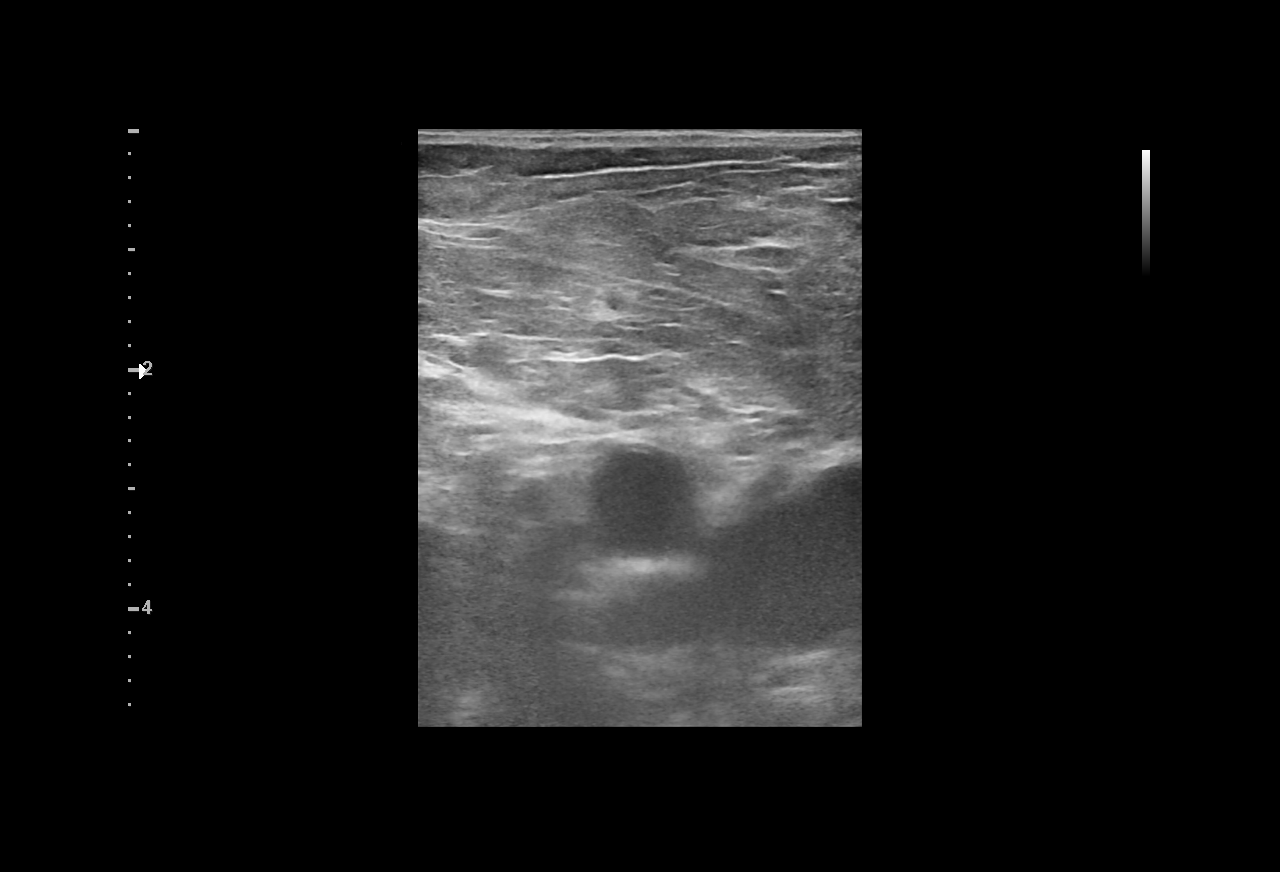

[2 of 2 positions shown; findings below may reference images not displayed]

EXAM:
1. Visceral angiography with selective injection of the celiac
artery trunk and splenic artery
2. Coil embolization of the splenic artery
3. Ultrasound guidance for vascular access

MEDICATIONS:
Moderate sedation

ANESTHESIA/SEDATION:
Moderate (conscious) sedation was employed during this procedure. A
total of Versed 6.0 mg and Fentanyl 200 mcg was administered
intravenously.

Moderate Sedation Time: 96 minutes. The patient's level of
consciousness and vital signs were monitored continuously by
radiology nursing throughout the procedure under my direct
supervision.

CONTRAST:  60 mL Omnipaque 350

FLUOROSCOPY TIME:  Fluoroscopy Time: 22 minutes, 24 seconds, 1,604
mGy

COMPLICATIONS:
None immediate.

PROCEDURE:
Informed consent was obtained from the patient following explanation
of the procedure, risks, benefits and alternatives. The patient
understands, agrees and consents for the procedure. All questions
were addressed. A time out was performed prior to the initiation of
the procedure.

Patient was placed supine. Ultrasound confirmed a patent right
common femoral artery. Ultrasound image was saved for documentation.
Right groin was prepped and draped in sterile fashion. Maximal
barrier sterile technique was utilized including caps, mask, sterile
gowns, sterile gloves, sterile drape, hand hygiene and skin
antiseptic.

Right groin was anesthetized with 1% lidocaine. Small incision was
made. Using ultrasound guidance, 21 gauge needle was directed into
the right common femoral artery and micropuncture dilator set was
placed. Five French vascular sheath was placed. Bentson wire was
advanced into the abdominal aorta. C2 catheter was used to cannulate
the celiac trunk. Celiac artery arteriography was performed. A
Lantern microcatheter was advanced into the splenic artery and
additional angiography was performed. Catheter was positioned in the
midportion of the main splenic artery. Multiple detachable coils
were placed within the splenic artery under fluoroscopic guidance.
Combination of Ruby, Interlock andIDC coils were used and the coil
sizes included 8 mm, 6 mm and 5 mm. Follow-up angiography was
performed following placement of the coils. The microcatheter was
removed and the 5 French catheter was removed. Arteriogram was
performed through the right groin sheath. Right groin sheath was
removed using an Angio-Seal closure device. Right groin hemostasis
at the end of the procedure.
FINDINGS: Celiac trunk is patent. Common hepatic artery and splenic artery are
widely patent. The splenic artery is very tortuous. Microcatheter
was advanced into the midportion of the main splenic artery.
Multiple coils are packed within the midportion of the splenic
artery. At the end of the procedure, there was near stagnant flow
within the proximal splenic artery. There was some residual filling
of a branch supplying the superior aspect of the spleen.
IMPRESSION: Successful coil embolization of the main splenic artery.
